# Patient Record
Sex: Female | Born: 1980
Health system: Southern US, Community
[De-identification: ages and names within clinical notes are randomized; demographics above are authoritative.]

## PROBLEM LIST (undated history)

## (undated) ENCOUNTER — Inpatient Hospital Stay (HOSPITAL_COMMUNITY): Payer: Self-pay

## (undated) DIAGNOSIS — E669 Obesity, unspecified: Secondary | ICD-10-CM

## (undated) DIAGNOSIS — G4700 Insomnia, unspecified: Secondary | ICD-10-CM

## (undated) DIAGNOSIS — T7840XA Allergy, unspecified, initial encounter: Secondary | ICD-10-CM

## (undated) DIAGNOSIS — R931 Abnormal findings on diagnostic imaging of heart and coronary circulation: Secondary | ICD-10-CM

## (undated) DIAGNOSIS — N83209 Unspecified ovarian cyst, unspecified side: Secondary | ICD-10-CM

## (undated) DIAGNOSIS — C73 Malignant neoplasm of thyroid gland: Secondary | ICD-10-CM

## (undated) DIAGNOSIS — Z6834 Body mass index (BMI) 34.0-34.9, adult: Secondary | ICD-10-CM

## (undated) DIAGNOSIS — C770 Secondary and unspecified malignant neoplasm of lymph nodes of head, face and neck: Secondary | ICD-10-CM

## (undated) DIAGNOSIS — I10 Essential (primary) hypertension: Secondary | ICD-10-CM

## (undated) DIAGNOSIS — G43909 Migraine, unspecified, not intractable, without status migrainosus: Secondary | ICD-10-CM

## (undated) DIAGNOSIS — R0989 Other specified symptoms and signs involving the circulatory and respiratory systems: Secondary | ICD-10-CM

## (undated) DIAGNOSIS — I341 Nonrheumatic mitral (valve) prolapse: Secondary | ICD-10-CM

## (undated) DIAGNOSIS — K5792 Diverticulitis of intestine, part unspecified, without perforation or abscess without bleeding: Secondary | ICD-10-CM

## (undated) DIAGNOSIS — G43019 Migraine without aura, intractable, without status migrainosus: Secondary | ICD-10-CM

## (undated) DIAGNOSIS — E890 Postprocedural hypothyroidism: Secondary | ICD-10-CM

## (undated) HISTORY — DX: Malignant neoplasm of thyroid gland: C73

## (undated) HISTORY — DX: Allergy, unspecified, initial encounter: T78.40XA

## (undated) HISTORY — DX: Body mass index (BMI) 34.0-34.9, adult: Z68.34

## (undated) HISTORY — DX: Diverticulitis of intestine, part unspecified, without perforation or abscess without bleeding: K57.92

## (undated) HISTORY — DX: Other specified symptoms and signs involving the circulatory and respiratory systems: R09.89

## (undated) HISTORY — DX: Migraine without aura, intractable, without status migrainosus: G43.019

## (undated) HISTORY — DX: Postprocedural hypothyroidism: E89.0

## (undated) HISTORY — DX: Migraine, unspecified, not intractable, without status migrainosus: G43.909

## (undated) HISTORY — DX: Insomnia, unspecified: G47.00

## (undated) HISTORY — DX: Nonrheumatic mitral (valve) prolapse: I34.1

## (undated) HISTORY — PX: OTHER SURGICAL HISTORY: SHX169

## (undated) HISTORY — DX: Obesity, unspecified: E66.9

## (undated) HISTORY — PX: GALLBLADDER SURGERY: SHX652

## (undated) HISTORY — DX: Essential (primary) hypertension: I10

## (undated) HISTORY — DX: Unspecified ovarian cyst, unspecified side: N83.209

## (undated) HISTORY — DX: Abnormal findings on diagnostic imaging of heart and coronary circulation: R93.1

## (undated) HISTORY — DX: Secondary and unspecified malignant neoplasm of lymph nodes of head, face and neck: C77.0

---

## 1999-06-06 ENCOUNTER — Other Ambulatory Visit: Admission: RE | Admit: 1999-06-06 | Discharge: 1999-06-06 | Payer: Self-pay | Admitting: Obstetrics and Gynecology

## 1999-11-12 ENCOUNTER — Inpatient Hospital Stay (HOSPITAL_COMMUNITY): Admission: AD | Admit: 1999-11-12 | Discharge: 1999-11-14 | Payer: Self-pay | Admitting: *Deleted

## 2000-07-27 ENCOUNTER — Encounter: Payer: Self-pay | Admitting: Emergency Medicine

## 2000-07-27 ENCOUNTER — Emergency Department (HOSPITAL_COMMUNITY): Admission: EM | Admit: 2000-07-27 | Discharge: 2000-07-27 | Payer: Self-pay | Admitting: Emergency Medicine

## 2000-08-20 ENCOUNTER — Other Ambulatory Visit: Admission: RE | Admit: 2000-08-20 | Discharge: 2000-08-20 | Payer: Self-pay | Admitting: Obstetrics & Gynecology

## 2001-08-15 ENCOUNTER — Other Ambulatory Visit: Admission: RE | Admit: 2001-08-15 | Discharge: 2001-08-15 | Payer: Self-pay | Admitting: Obstetrics and Gynecology

## 2002-08-15 ENCOUNTER — Other Ambulatory Visit: Admission: RE | Admit: 2002-08-15 | Discharge: 2002-08-15 | Payer: Self-pay | Admitting: Obstetrics and Gynecology

## 2002-09-08 ENCOUNTER — Encounter (HOSPITAL_BASED_OUTPATIENT_CLINIC_OR_DEPARTMENT_OTHER): Payer: Self-pay | Admitting: General Surgery

## 2002-09-11 ENCOUNTER — Encounter (INDEPENDENT_AMBULATORY_CARE_PROVIDER_SITE_OTHER): Payer: Self-pay | Admitting: *Deleted

## 2002-09-11 ENCOUNTER — Ambulatory Visit (HOSPITAL_COMMUNITY): Admission: RE | Admit: 2002-09-11 | Discharge: 2002-09-12 | Payer: Self-pay | Admitting: General Surgery

## 2002-09-11 ENCOUNTER — Encounter (HOSPITAL_BASED_OUTPATIENT_CLINIC_OR_DEPARTMENT_OTHER): Payer: Self-pay | Admitting: General Surgery

## 2003-05-08 ENCOUNTER — Encounter: Payer: Self-pay | Admitting: Emergency Medicine

## 2003-05-08 ENCOUNTER — Emergency Department (HOSPITAL_COMMUNITY): Admission: EM | Admit: 2003-05-08 | Discharge: 2003-05-08 | Payer: Self-pay | Admitting: *Deleted

## 2005-11-20 ENCOUNTER — Other Ambulatory Visit: Admission: RE | Admit: 2005-11-20 | Discharge: 2005-11-20 | Payer: Self-pay | Admitting: Obstetrics and Gynecology

## 2006-03-13 ENCOUNTER — Ambulatory Visit (HOSPITAL_COMMUNITY): Admission: RE | Admit: 2006-03-13 | Discharge: 2006-03-13 | Payer: Self-pay | Admitting: Obstetrics and Gynecology

## 2006-06-04 ENCOUNTER — Inpatient Hospital Stay (HOSPITAL_COMMUNITY): Admission: AD | Admit: 2006-06-04 | Discharge: 2006-06-07 | Payer: Self-pay | Admitting: Obstetrics and Gynecology

## 2006-11-29 ENCOUNTER — Ambulatory Visit (HOSPITAL_COMMUNITY): Admission: RE | Admit: 2006-11-29 | Discharge: 2006-11-29 | Payer: Self-pay | Admitting: Obstetrics and Gynecology

## 2006-12-10 ENCOUNTER — Other Ambulatory Visit: Admission: RE | Admit: 2006-12-10 | Discharge: 2006-12-10 | Payer: Self-pay | Admitting: Interventional Radiology

## 2006-12-10 ENCOUNTER — Encounter (INDEPENDENT_AMBULATORY_CARE_PROVIDER_SITE_OTHER): Payer: Self-pay | Admitting: *Deleted

## 2006-12-10 ENCOUNTER — Encounter: Admission: RE | Admit: 2006-12-10 | Discharge: 2006-12-10 | Payer: Self-pay | Admitting: Obstetrics and Gynecology

## 2007-01-08 ENCOUNTER — Ambulatory Visit: Payer: Self-pay | Admitting: Internal Medicine

## 2007-01-15 ENCOUNTER — Encounter: Admission: RE | Admit: 2007-01-15 | Discharge: 2007-01-15 | Payer: Self-pay | Admitting: Internal Medicine

## 2007-01-18 ENCOUNTER — Encounter (INDEPENDENT_AMBULATORY_CARE_PROVIDER_SITE_OTHER): Payer: Self-pay | Admitting: *Deleted

## 2007-01-18 ENCOUNTER — Ambulatory Visit (HOSPITAL_COMMUNITY): Admission: RE | Admit: 2007-01-18 | Discharge: 2007-01-19 | Payer: Self-pay | Admitting: General Surgery

## 2007-02-05 ENCOUNTER — Encounter: Admission: RE | Admit: 2007-02-05 | Discharge: 2007-02-05 | Payer: Self-pay | Admitting: Endocrinology

## 2007-02-13 ENCOUNTER — Encounter: Admission: RE | Admit: 2007-02-13 | Discharge: 2007-02-13 | Payer: Self-pay | Admitting: Endocrinology

## 2007-02-20 ENCOUNTER — Encounter: Admission: RE | Admit: 2007-02-20 | Discharge: 2007-02-20 | Payer: Self-pay | Admitting: Endocrinology

## 2007-08-08 ENCOUNTER — Emergency Department (HOSPITAL_COMMUNITY): Admission: EM | Admit: 2007-08-08 | Discharge: 2007-08-08 | Payer: Self-pay | Admitting: Emergency Medicine

## 2008-02-28 ENCOUNTER — Emergency Department (HOSPITAL_COMMUNITY): Admission: EM | Admit: 2008-02-28 | Discharge: 2008-02-28 | Payer: Self-pay | Admitting: Family Medicine

## 2008-03-04 ENCOUNTER — Encounter: Admission: RE | Admit: 2008-03-04 | Discharge: 2008-03-04 | Payer: Self-pay | Admitting: Endocrinology

## 2008-04-06 ENCOUNTER — Ambulatory Visit (HOSPITAL_COMMUNITY): Admission: RE | Admit: 2008-04-06 | Discharge: 2008-04-06 | Payer: Self-pay | Admitting: Endocrinology

## 2008-05-06 ENCOUNTER — Encounter: Payer: Self-pay | Admitting: Endocrinology

## 2008-05-06 DIAGNOSIS — E041 Nontoxic single thyroid nodule: Secondary | ICD-10-CM | POA: Insufficient documentation

## 2008-05-28 ENCOUNTER — Emergency Department (HOSPITAL_COMMUNITY): Admission: EM | Admit: 2008-05-28 | Discharge: 2008-05-28 | Payer: Self-pay | Admitting: Family Medicine

## 2008-07-28 ENCOUNTER — Encounter (HOSPITAL_COMMUNITY): Admission: RE | Admit: 2008-07-28 | Discharge: 2008-09-17 | Payer: Self-pay | Admitting: Family Medicine

## 2009-04-19 ENCOUNTER — Encounter (HOSPITAL_COMMUNITY): Admission: RE | Admit: 2009-04-19 | Discharge: 2009-06-23 | Payer: Self-pay | Admitting: Endocrinology

## 2010-07-11 ENCOUNTER — Encounter (HOSPITAL_COMMUNITY): Admission: RE | Admit: 2010-07-11 | Discharge: 2010-09-22 | Payer: Self-pay | Admitting: Endocrinology

## 2011-01-15 ENCOUNTER — Encounter: Payer: Self-pay | Admitting: Endocrinology

## 2011-01-15 ENCOUNTER — Encounter: Payer: Self-pay | Admitting: Obstetrics and Gynecology

## 2011-01-16 ENCOUNTER — Encounter: Payer: Self-pay | Admitting: Endocrinology

## 2011-03-11 LAB — HCG, SERUM, QUALITATIVE: Preg, Serum: NEGATIVE

## 2011-04-05 LAB — HCG, SERUM, QUALITATIVE: Preg, Serum: NEGATIVE

## 2011-05-12 NOTE — Op Note (Signed)
NAME:  DELAYNA, SPARLIN                          ACCOUNT NO.:  [id number]   MEDICAL RECORD NO.:  [id number]                   PATIENT TYPE:  OIB   LOCATION:  NA                                   FACILITY:  [facility]   PHYSICIAN:  Luisa Hart L. Lurene Shadow, M.D.             DATE OF BIRTH:  [birth year]   DATE OF PROCEDURE:  09/11/2002  DATE OF DISCHARGE:                                 OPERATIVE REPORT   PREOPERATIVE DIAGNOSIS:  Chronic calculous cholecystitis.   POSTOPERATIVE DIAGNOSIS:  Chronic calculous cholecystitis.   PROCEDURE:  Laparoscopic cholecystectomy, intraoperative cholangiogram.   SURGEON:  Luisa Hart L. Lurene Shadow, M.D.   ASSISTANT:  Marnee Spring. Wiliam Ke, M.D.   ANESTHESIA:  General.   CLINICAL NOTE:  The patient is a 30 year old woman who presented with  symptoms of right upper quadrant pain with radiation to the scapula,  associated nausea and emesis, and bloating following fatty meals.  Abdominal  ultrasound demonstrates cholelithiasis.  The patient comes to the operating  room now after being apprised of the risks and potential benefits of  surgery.  All questions answered and consent for surgery obtained.   DESCRIPTION OF PROCEDURE:  Following the induction of satisfactory general  anesthesia with the patient positioned supinely, the abdomen was routinely  prepped and draped to be included in a sterile operative field.  Open  laparoscopy created at the umbilicus with insertion of a Hasson cannula and  insufflation of the peritoneal cavity to 14 mmHg pressure.  The camera was  inserted and visual exploration of the abdomen carried out.  The gallbladder  was noted to be chronically scarred with multiple adhesions around the  ampulla of the gallbladder, tethering the duodenum.  None of the small or  large intestine that was viewed appeared to be abnormal.  The edge of the  liver was smooth.  The liver surfaces were otherwise unremarkable, and none  of the pelvic organs were visualized.   Under direct vision, epigastric and  lateral ports were placed.  The gallbladder is grasped and retracted  cephalad and dissection carried down in the region of the ampulla, removing  all the adhesions, and carrying the dissection into the region to isolate  the cystic artery and cystic duct.  The cystic artery is traced up to its  entrance with the gallbladder wall, cystic duct to the gallbladder-cystic  duct junction, and down to the common duct.  The cystic duct was clipped  proximally and opened.  I inserted a Reddick catheter into the abdomen  through a 14-gauge Angiocath and inserted this into the cystic duct, where a  fluoroscopic cholangiogram was carried out, injecting one-half strength  Hypaque into the biliary system.  It showed free flow of contrast to the  duodenum, no filling defects within the common hepatic duct, common bile  duct, or of the upper hepatic radicles.  The catheter was removed from the  cystic duct  and the cystic duct doubly clipped and transected.  The cystic  artery was also doubly clipped and transected.  The gallbladder was  dissected free from the liver bed and maintaining hemostasis throughout the  course of the dissection.  This was done with electrocautery.  At the end of  the dissection, the liver bed was again checked for hemostasis, noted to be  dry.  The right upper quadrant was thoroughly irrigated with normal saline  and aspirated.  The gallbladder was then placed in an EndoCatch and  retrieved through the umbilical port without difficulty.  Sponge,  instrument, and sharp counts were verified and the pneumoperitoneum allowed  to deflate after the trocars had been removed under direct vision.  The  wounds were then closed in  layers as follows:  Epigastric and lateral  wounds closed with 4-0 Dexon and the umbilical wound closed in two layers  with 0 Dexon and 4-0 Dexon.  All wounds were then reinforced with Steri-  Strips, sterile dressings were  applied.  Anesthetic reversed, the patient  removed from the operating room to the recovery room in stable condition.  She tolerated the procedure well.                                               Mardene Celeste Lurene Shadow, M.D.    PLB/MEDQ  D:  09/11/2002  T:  09/12/2002  Job:  [id number]
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
## 2011-05-12 NOTE — Op Note (Signed)
Alexandria [name], Alexandria [name]                ACCOUNT NO.:  [id number]   MEDICAL RECORD NO.:  [id number]          PATIENT TYPE:  AMB   LOCATION:  DAY                          FACILITY:  [facility]   PHYSICIAN:  Alexandria [name], M.D.   DATE OF BIRTH:  11/10/1981   DATE OF PROCEDURE:  01/18/2007  DATE OF DISCHARGE:                               OPERATIVE REPORT   PREOPERATIVE DIAGNOSIS:  Papillary carcinoma of the thyroid.   POSTOPERATIVE DIAGNOSIS:  Papillary carcinoma of the thyroid.   PROCEDURE:  Total thyroidectomy and central neck lymph node dissection.   SURGEON:  Dr. Leonie [name].   ASSISTANT:  Dr. Cicero Duck.   ANESTHESIA:  General.   SPECIMENS TO LAB:  1. Thyroid gland.  2. Lymph node central neck.   ESTIMATED BLOOD LOSS:  20 mL.   COMPLICATIONS:  None apparent.  The patient returned to the PACU in  excellent condition.   Note, Alexandria [name] is a 30 year old female who is noted to have a right-  sided thyroid mass which on aspiration biopsy is positive for papillary  carcinoma.  The patient comes to the operating room after the risks and  potential benefits of surgery have been fully discussed including a risk  of recurrent laryngeal nerve injury and the risks of permanent  hypoparathyroidism. She understands these risks and gives her consent to  surgery.  She had previously requested that we take out only the left  lobe until the cancer has been proven definitively and then proceed with  total thyroidectomy as necessary.   DESCRIPTION OF PROCEDURE:  The patient is positioned supinely, the head  and neck slightly hyperextended. The neck is prepped and draped to be  included into a sterile operative field following the induction of  satisfactory general anesthesia.  A transverse neck incision was carried  down through skin and subcutaneous tissues and across the platysma  muscle, a superior flap raised up to the thyroid cartilage and an  inferior flap carried down to the  sternal notch.  The strap muscles are  divided and dissection carried down on the right side of the thyroid.  Immediately upon entering this area, there were noted to be multiple  lymph nodes in the central compartment. One of these was then taken off  and sent for frozen section.  Frozen section diagnosis confirmed  metastatic papillary carcinoma.  At that point, we proceeded to go ahead  with a total thyroidectomy.  Dissection was carried up to the superior  pole of the thyroid on the right. We were careful and observed a  superior laryngeal nerve which was spared.  The superior pole vessels  were secured with 2-0 silk and with clips.  Both the superior and  inferior parathyroid glands as well as the recurrent laryngeal nerve  were identified and the thyroid was dissected free from the surrounding  tissues, carrying the dissection across the midline and taking the area  of the thyroidima  using the harmonic scalpel.  Attention was then  turned to the left lobe of the thyroid which similarly both the  pyramidal lobe and  the superior pole were taken between ties of 2-0 silk  and with clips placed superiorly. Both the superior and inferior  parathyroid glands as well as the recurrent laryngeal nerve was  positively identified on the on the left side and spared during the  course of the dissection. The parathyroid was dissected free from the  trachea, removed and forwarded for pathologic evaluation with a stitch  placed at the superior pole of the right lobe.   A central compartment neck dissection was then carried out removing a  large number of matted lymph nodes from the lower trachea down into the  superior portion of the mediastinum.  On the right side, the nodal  involvement was closely adhered to the recurrent laryngeal nerve;  however, we think that this was removed successfully without any  permanent injury to the recurrent nerve.  The specimen was removed and  forwarded for  pathologic evaluation.  There were no additional palpable  nodes within this superior portion of the mediastinum that could be  palpated or the lateral portion of the central neck compartment.  Sponge  and instrument counts were then verified. hemostasis was assured with  electrocautery and the midline strap muscle then closed with a running 3-  0 Vicryl suture.  Platysma muscle closed with interrupted 3-0 Vicryl  sutures and skin closed with 5-0 Monocryl suture. The wound was then  reinforced with Steri-Strips, sterile dressings applied, the anesthetic  reversed and the patient removed from the operating room to the recovery  room in stable condition.  She tolerated the procedure well.      Alexandria [name], M.D.  Electronically Signed     PB/MEDQ  D:  01/18/2007  T:  01/18/2007  Job:  [id number]   cc:   Dorisann Frames, M.D.  Fax: [phone]   Robyn N. Allyne Gee, M.D.  Fax: [phone]

## 2011-05-12 NOTE — Consult Note (Signed)
[facility]                          ENDOCRINOLOGY CONSULTATION   STOREY, STANGELAND                       MRN:          [id number]  DATE:01/08/2007                            DOB:          [birth year]    ENDOCRINOLOGY CONSULTATION REPORT:  The patient is self-referred.   REASON FOR REFERRAL:  Thyroid nodule.   HISTORY OF PRESENT ILLNESS:  The patient is a 30 year old woman, who was  recently noted to have a nodule in the right side of her thyroid.  She  underwent biopsy and the cytology returned consistent with papillary  adenocarcinoma of the thyroid.  She was referred to Dr. Lurene Shadow to  consider thyroidectomy.  However, the patient states that, thereafter,  I went to a holistic doctor, because I believe there is a better way.  She further states, I have done research on this.   PAST MEDICAL HISTORY:  Hypertension.  She is now seven months  postpartum.   MEDICATIONS:  Oral contraceptive and Maxzide.   SOCIAL HISTORY:  She is married.  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   FAMILY HISTORY:  Negative for thyroid disease.   REVIEW OF SYSTEMS:  Denies shortness of breath and nausea.   PHYSICAL EXAMINATION:  Blood pressure 119/77, heart rate 65, temperature  is 97.6, the weight is 190.  GENERAL:  No distress.  SKIN:  Normal texture and temperature, no rash.  EYES:  No proptosis, no periorbital swelling.  NECK:  There is a 3 cm right-sided thyroid nodule.  There is no  lymphadenopathy at the neck, nor at the supraclavicular area.  NEUROLOGIC:  Alert, well-oriented.  Does not appear anxious nor  depressed, and there is no tremor.   LABORATORY STUDIES:  On December 10, 2006, cytology from needle biopsy  of right-sided thyroid mass is consistent with papillary adenocarcinoma  of the thyroid.  Thyroid ultrasound shows a 3.5 x 1.8 x 2.6 cm thyroid  mass.   IMPRESSION:  Thyroid mass, most likely papillary adenocarcinoma of  the  thyroid.   PLAN:  1. Twenty-minute discussion in a thirty-minute office visit regarding      the very high risk for malignancy.  I have told her it is extremely      risky to not undergo thyroidectomy.  I further told her that the      mode of treatment she is pursuing is ineffective and the only      effective therapy is thyroidectomy (in most cases, followed by      adjuvant iodine-131 therapy).  I have told her      she risks unnecessary death and disability by refusing surgery.  2. Return here p.r.n.     Sean A. Everardo All, MD  Electronically Signed    SAE/MedQ  DD: 01/11/2007  DT: 01/11/2007  Job #: [id number]   cc:   Osborn Coho, M.D.  Leonie Man, M.D.

## 2011-07-19 ENCOUNTER — Emergency Department (HOSPITAL_COMMUNITY)
Admission: EM | Admit: 2011-07-19 | Discharge: 2011-07-20 | Disposition: A | Discharge status: Home / Self Care | Payer: BC Managed Care – PPO | Attending: Emergency Medicine | Admitting: Emergency Medicine

## 2011-07-19 DIAGNOSIS — R42 Dizziness and giddiness: Secondary | ICD-10-CM | POA: Insufficient documentation

## 2011-07-19 DIAGNOSIS — R110 Nausea: Secondary | ICD-10-CM | POA: Insufficient documentation

## 2011-07-19 DIAGNOSIS — I10 Essential (primary) hypertension: Secondary | ICD-10-CM | POA: Insufficient documentation

## 2011-07-19 DIAGNOSIS — Z85850 Personal history of malignant neoplasm of thyroid: Secondary | ICD-10-CM | POA: Insufficient documentation

## 2011-07-19 DIAGNOSIS — R1084 Generalized abdominal pain: Secondary | ICD-10-CM | POA: Insufficient documentation

## 2011-07-19 DIAGNOSIS — E039 Hypothyroidism, unspecified: Secondary | ICD-10-CM | POA: Insufficient documentation

## 2011-07-19 DIAGNOSIS — R55 Syncope and collapse: Secondary | ICD-10-CM | POA: Insufficient documentation

## 2011-07-19 LAB — DIFFERENTIAL
Basophils Absolute: 0 10*3/uL (ref 0.0–0.1)
Basophils Relative: 0 % (ref 0–1)
Eosinophils Absolute: 0.2 10*3/uL (ref 0.0–0.7)
Eosinophils Relative: 2 % (ref 0–5)
Lymphocytes Relative: 32 % (ref 12–46)
Lymphs Abs: 2.5 10*3/uL (ref 0.7–4.0)
Monocytes Absolute: 0.6 10*3/uL (ref 0.1–1.0)
Monocytes Relative: 8 % (ref 3–12)
Neutro Abs: 4.5 10*3/uL (ref 1.7–7.7)
Neutrophils Relative %: 58 % (ref 43–77)

## 2011-07-19 LAB — CBC
HCT: 40.1 % (ref 36.0–46.0)
Hemoglobin: 13.5 g/dL (ref 12.0–15.0)
MCH: 27.4 pg (ref 26.0–34.0)
MCHC: 33.7 g/dL (ref 30.0–36.0)
MCV: 81.3 fL (ref 78.0–100.0)
Platelets: 264 10*3/uL (ref 150–400)
RBC: 4.93 MIL/uL (ref 3.87–5.11)
RDW: 12.4 % (ref 11.5–15.5)
WBC: 7.8 10*3/uL (ref 4.0–10.5)

## 2011-07-19 LAB — POCT PREGNANCY, URINE: Preg Test, Ur: NEGATIVE

## 2011-07-20 LAB — URINALYSIS, ROUTINE W REFLEX MICROSCOPIC
Bilirubin Urine: NEGATIVE
Glucose, UA: NEGATIVE mg/dL
Hgb urine dipstick: NEGATIVE
Ketones, ur: NEGATIVE mg/dL
Nitrite: NEGATIVE
Protein, ur: 100 mg/dL — AB
Specific Gravity, Urine: 1.021 (ref 1.005–1.030)
Urobilinogen, UA: 0.2 mg/dL (ref 0.0–1.0)
pH: 6.5 (ref 5.0–8.0)

## 2011-07-20 LAB — COMPREHENSIVE METABOLIC PANEL
ALT: 29 U/L (ref 0–35)
AST: 20 U/L (ref 0–37)
Albumin: 3.6 g/dL (ref 3.5–5.2)
Alkaline Phosphatase: 108 U/L (ref 39–117)
BUN: 14 mg/dL (ref 6–23)
CO2: 24 mEq/L (ref 19–32)
Calcium: 9.3 mg/dL (ref 8.4–10.5)
Chloride: 103 mEq/L (ref 96–112)
Creatinine, Ser: 1.2 mg/dL — ABNORMAL HIGH (ref 0.50–1.10)
GFR calc Af Amer: >60 mL/min (ref >60)
GFR calc non Af Amer: 52 mL/min — ABNORMAL LOW (ref >60)
Glucose, Bld: 131 mg/dL — ABNORMAL HIGH (ref 70–99)
Potassium: 3.4 mEq/L — ABNORMAL LOW (ref 3.5–5.1)
Sodium: 138 mEq/L (ref 135–145)
Total Bilirubin: 0.2 mg/dL — ABNORMAL LOW (ref 0.3–1.2)
Total Protein: 7.2 g/dL (ref 6.0–8.3)

## 2011-07-20 LAB — URINE MICROSCOPIC-ADD ON

## 2011-07-25 DIAGNOSIS — S93326A Dislocation of tarsometatarsal joint of unspecified foot, initial encounter: Secondary | ICD-10-CM

## 2011-09-21 LAB — INFLUENZA A AND B ANTIGEN (CONVERTED LAB)
Inflenza A Ag: NEGATIVE
Influenza B Ag: NEGATIVE

## 2011-09-21 LAB — POCT RAPID STREP A: Streptococcus, Group A Screen (Direct): NEGATIVE

## 2011-09-22 LAB — HCG, SERUM, QUALITATIVE
Preg, Serum: NEGATIVE
Preg, Serum: NEGATIVE

## 2011-10-10 ENCOUNTER — Other Ambulatory Visit: Payer: Self-pay | Admitting: Family Medicine

## 2011-10-10 ENCOUNTER — Ambulatory Visit
Admission: RE | Admit: 2011-10-10 | Discharge: 2011-10-10 | Disposition: A | Discharge status: Home / Self Care | Payer: BC Managed Care – PPO | Source: Ambulatory Visit | Attending: Family Medicine | Admitting: Family Medicine

## 2011-10-10 DIAGNOSIS — M549 Dorsalgia, unspecified: Secondary | ICD-10-CM

## 2011-10-10 DIAGNOSIS — M542 Cervicalgia: Secondary | ICD-10-CM

## 2012-06-14 ENCOUNTER — Encounter: Payer: Self-pay | Admitting: Obstetrics and Gynecology

## 2012-06-21 ENCOUNTER — Encounter: Payer: Self-pay | Admitting: Obstetrics and Gynecology

## 2012-06-21 ENCOUNTER — Ambulatory Visit (INDEPENDENT_AMBULATORY_CARE_PROVIDER_SITE_OTHER): Payer: BC Managed Care – PPO | Admitting: Obstetrics and Gynecology

## 2012-06-21 VITALS — BP 112/70 | HR 70 | Ht 63.5 in | Wt 205.0 lb

## 2012-06-21 DIAGNOSIS — I10 Essential (primary) hypertension: Secondary | ICD-10-CM | POA: Insufficient documentation

## 2012-06-21 DIAGNOSIS — Z01419 Encounter for gynecological examination (general) (routine) without abnormal findings: Secondary | ICD-10-CM

## 2012-06-21 DIAGNOSIS — M791 Myalgia, unspecified site: Secondary | ICD-10-CM | POA: Insufficient documentation

## 2012-06-21 DIAGNOSIS — IMO0001 Reserved for inherently not codable concepts without codable children: Secondary | ICD-10-CM

## 2012-06-21 DIAGNOSIS — R5381 Other malaise: Secondary | ICD-10-CM

## 2012-06-21 DIAGNOSIS — R5383 Other fatigue: Secondary | ICD-10-CM | POA: Insufficient documentation

## 2012-06-21 DIAGNOSIS — R0989 Other specified symptoms and signs involving the circulatory and respiratory systems: Secondary | ICD-10-CM | POA: Insufficient documentation

## 2012-06-21 DIAGNOSIS — G44009 Cluster headache syndrome, unspecified, not intractable: Secondary | ICD-10-CM

## 2012-06-21 DIAGNOSIS — Z124 Encounter for screening for malignant neoplasm of cervix: Secondary | ICD-10-CM

## 2012-06-21 DIAGNOSIS — C140 Malignant neoplasm of pharynx, unspecified: Secondary | ICD-10-CM

## 2012-06-21 LAB — POCT URINE PREGNANCY: Preg Test, Ur: NEGATIVE
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
## 2012-06-21 NOTE — Progress Notes (Addendum)
Subjective:    Alexandria [name] is a 31 y.o. female, G2P2002, who presents for an annual exam. The patient sees Dr Vela Prose for migraines and  still has right sided HA in temporal area and occurs at the end of period. Has photo/phonophobia, nausea but denies any unilateral weakness or vision loss. Hasn't taken BCPs in over a year. Menstrual cycle:   LMP: Patient's last menstrual period was 06/09/2012. Flow 3 days,  with pad change every 3-4 hours with mild cramping.             Review of Systems Pertinent items are noted in HPI. Denies pelvic pain, urinary tract symptoms, vaginitis symptoms, irregular bleeding, menopausal symptoms, change in bowel habits or rectal bleeding   Objective:    BP 112/70  Pulse 70  Ht 5' 3.5" (1.613 m)  Wt 205 lb (92.987 kg)  BMI 35.74 kg/m2  LMP 06/09/2012   Wt Readings from Last 1 Encounters:  06/21/12 205 lb (92.987 kg)   Body mass index is 35.74 kg/(m^2). General Appearance: Alert, no acute distress HEENT: Grossly normal Neck / Thyroid: Supple, no thyromegaly or cervical adenopathy Lungs: Clear to auscultation bilaterally Back: No CVA tenderness Breast Exam: No masses or nodes.No dimpling, nipple retraction or discharge. Cardiovascular: Regular rate and rhythm.  Gastrointestinal: Soft, non-tender, no masses or organomegaly Pelvic Exam: EGBUS-wnl, vagina-normal rugae, cervix- without lesions or tenderness, uterus appears normal size shape and consistency, adnexae-no masses or tenderness Lymphatic Exam: Non-palpable nodes in neck, clavicular,  axillary, or inguinal regions  Skin: no rashes or abnormalities Extremities: no clubbing cyanosis or edema  Neurologic: grossly normal Psychiatric: Alert and oriented  UPT-negative Assessment:   Routine GYN Exam Migraines   Plan:  Patient to follow up with Dr. Vela Prose for migraines  To continue to use condoms for contraception  PAP sent  RTO 1 year or prn  Alexandria [name],ELMIRAPA-C

## 2012-06-24 LAB — PAP IG W/ RFLX HPV ASCU

## 2013-09-30 ENCOUNTER — Ambulatory Visit (INDEPENDENT_AMBULATORY_CARE_PROVIDER_SITE_OTHER): Payer: BC Managed Care – PPO | Admitting: Family Medicine

## 2013-09-30 ENCOUNTER — Encounter: Payer: Self-pay | Admitting: Family Medicine

## 2013-09-30 VITALS — BP 128/90 | HR 78 | Temp 97.3°F | Resp 14 | Ht 62.5 in | Wt 199.0 lb

## 2013-09-30 DIAGNOSIS — I10 Essential (primary) hypertension: Secondary | ICD-10-CM

## 2013-09-30 DIAGNOSIS — Z0000 Encounter for general adult medical examination without abnormal findings: Secondary | ICD-10-CM

## 2013-09-30 DIAGNOSIS — E890 Postprocedural hypothyroidism: Secondary | ICD-10-CM | POA: Insufficient documentation

## 2013-09-30 DIAGNOSIS — R7309 Other abnormal glucose: Secondary | ICD-10-CM

## 2013-09-30 DIAGNOSIS — R7303 Prediabetes: Secondary | ICD-10-CM

## 2013-09-30 DIAGNOSIS — G43909 Migraine, unspecified, not intractable, without status migrainosus: Secondary | ICD-10-CM | POA: Insufficient documentation

## 2013-09-30 DIAGNOSIS — E559 Vitamin D deficiency, unspecified: Secondary | ICD-10-CM

## 2013-09-30 DIAGNOSIS — E039 Hypothyroidism, unspecified: Secondary | ICD-10-CM

## 2013-09-30 MED ORDER — TRIAMTERENE-HCTZ 37.5-25 MG PO TABS: 1.0000 | ORAL_TABLET | Freq: Every day | ORAL | Status: DC

## 2013-09-30 NOTE — Assessment & Plan Note (Signed)
Check Vit D level 

## 2013-09-30 NOTE — Assessment & Plan Note (Signed)
Continue current meds She will not continue to neurology at this time

## 2013-09-30 NOTE — Progress Notes (Signed)
  Subjective:    Patient ID: Alexandria [name], female    DOB: 1981-03-20, 32 y.o.   MRN: [id number]  HPI Patient here for complete physical this is her second visit at our office. She's currently menstruating therefore we will delay the Pap smear. Medications and history reviewed  Is history of migraine disorder she was being followed by neurology. She's had migraines since childhood has never been on any maintenance medication such as Topamax or propanolol she's always been on either anti-inflammatories or currently triptans. Her migraines are now cyclical with her menstrual cycle.  She also has history of thyroid cancer status post thyroidectomy she is currently followed by endocrinology once a year for her levels. She was last seen in January and things are doing well. She was also on ergocalciferol however she has not taken for the past 6 months.  Hypertension she's been on medications for the past 10 years has had high blood pressure for greater than 15 years she's doing well her medication has no specific concerns. When she does this her blood pressure at home it runs in the 120 systolic and less than 90 diastolic.  SHe was told she was pre-diabetic and wants to have labs rechecked  Review of Systems  GEN- denies fatigue, fever, weight loss,weakness, recent illness HEENT- denies eye drainage, change in vision, nasal discharge, CVS- denies chest pain, palpitations RESP- denies SOB, cough, wheeze ABD- denies N/V, change in stools, abd pain GU- denies dysuria, hematuria, dribbling, incontinence MSK- denies joint pain, muscle aches, injury Neuro- denies headache, dizziness, syncope, seizure activity      Objective:   Physical Exam  GEN- NAD, alert and oriented x3 HEENT- PERRL, EOMI, non injected sclera, pink conjunctiva, MMM, oropharynx clear, TM clear bilat Neck- Supple, no thryomegaly CVS- RRR, no murmur RESP-CTAB ABD-NABS,soft,NT,ND EXT- No edema Pulses- Radial, DP- 2+        Assessment & Plan:   CPE-  PAP Smear to be rescheduled due to menses, she is not fasting- will return for fasting labs   Declines flu shot, TDAP UTD

## 2013-09-30 NOTE — Patient Instructions (Addendum)
Release of records - [facility]) Dr. Rosalita Chessman Release of records- Dr. Vela Prose ( Neurology [facility]) Continue current medications Come fasting for next visit with PAP Smear and labs will be done  F/U within the next 2-3 weeks

## 2013-09-30 NOTE — Assessment & Plan Note (Signed)
Check A1C with fasting labs 

## 2013-09-30 NOTE — Assessment & Plan Note (Signed)
Well controlled, fasting labs for CPE

## 2013-10-01 ENCOUNTER — Telehealth: Payer: Self-pay | Admitting: Family Medicine

## 2013-10-01 NOTE — Telephone Encounter (Signed)
Message copied by Samuella Cota on Wed Oct 01, 2013 12:54 PM ------      Message from: Milinda Antis F      Created: Wed Oct 01, 2013  8:23 AM      Regarding: RE: Pt returning for PAP and labs       Please let pt know, we will not charge her for the OV when she comes in for her PAP Smear and labs. I discussed with my office manager due to being on menses                        ----- Message -----         From: Verna Czech         Sent: 10/01/2013   7:34 AM           To: Salley Scarlet, MD      Subject: RE: Pt returning for PAP and labs                        You can just no charge it.            If you already billed for a CPE.       ----- Message -----         From: Salley Scarlet, MD         Sent: 09/30/2013  10:00 PM           To: Verna Czech      Subject: Pt returning for PAP and labs                                   Pt was here for CPE, needs to return for labs which is fine as not fasting      But was on menses and we needed to defer the PAP smear, I told her it would be a separate OV, as this is what I have done in the past.            Is there a way to  Bundle it to her CPE?             ------

## 2013-10-01 NOTE — Telephone Encounter (Signed)
Pt is aware that will not be charged for OV when she comes in.

## 2013-10-14 ENCOUNTER — Ambulatory Visit (INDEPENDENT_AMBULATORY_CARE_PROVIDER_SITE_OTHER): Payer: BC Managed Care – PPO | Admitting: Family Medicine

## 2013-10-14 ENCOUNTER — Encounter: Payer: Self-pay | Admitting: Family Medicine

## 2013-10-14 VITALS — BP 110/80 | HR 68 | Temp 97.1°F | Resp 20 | Wt 201.0 lb

## 2013-10-14 DIAGNOSIS — R7309 Other abnormal glucose: Secondary | ICD-10-CM

## 2013-10-14 DIAGNOSIS — E559 Vitamin D deficiency, unspecified: Secondary | ICD-10-CM

## 2013-10-14 DIAGNOSIS — R7303 Prediabetes: Secondary | ICD-10-CM

## 2013-10-14 DIAGNOSIS — Z124 Encounter for screening for malignant neoplasm of cervix: Secondary | ICD-10-CM

## 2013-10-14 DIAGNOSIS — I10 Essential (primary) hypertension: Secondary | ICD-10-CM

## 2013-10-14 DIAGNOSIS — Z0000 Encounter for general adult medical examination without abnormal findings: Secondary | ICD-10-CM

## 2013-10-14 LAB — CBC WITH DIFFERENTIAL/PLATELET
Basophils Absolute: 0 10*3/uL (ref 0.0–0.1)
Basophils Relative: 1 % (ref 0–1)
Eosinophils Absolute: 0.2 10*3/uL (ref 0.0–0.7)
Eosinophils Relative: 3 % (ref 0–5)
HCT: 38.7 % (ref 36.0–46.0)
Hemoglobin: 13.3 g/dL (ref 12.0–15.0)
Lymphocytes Relative: 48 % — ABNORMAL HIGH (ref 12–46)
Lymphs Abs: 2.1 10*3/uL (ref 0.7–4.0)
MCH: 27.9 pg (ref 26.0–34.0)
MCHC: 34.4 g/dL (ref 30.0–36.0)
MCV: 81.1 fL (ref 78.0–100.0)
Monocytes Absolute: 0.4 10*3/uL (ref 0.1–1.0)
Monocytes Relative: 10 % (ref 3–12)
Neutro Abs: 1.7 10*3/uL (ref 1.7–7.7)
Neutrophils Relative %: 38 % — ABNORMAL LOW (ref 43–77)
Platelets: 326 10*3/uL (ref 150–400)
RBC: 4.77 MIL/uL (ref 3.87–5.11)
RDW: 13.1 % (ref 11.5–15.5)
WBC: 4.4 10*3/uL (ref 4.0–10.5)

## 2013-10-14 LAB — HEMOGLOBIN A1C
Hgb A1c MFr Bld: 5.9 % — ABNORMAL HIGH (ref <5.7)
Mean Plasma Glucose: 123 mg/dL — ABNORMAL HIGH (ref <117)

## 2013-10-14 LAB — COMPREHENSIVE METABOLIC PANEL
ALT: 25 U/L (ref 0–35)
AST: 21 U/L (ref 0–37)
Albumin: 4.2 g/dL (ref 3.5–5.2)
Alkaline Phosphatase: 84 U/L (ref 39–117)
BUN: 11 mg/dL (ref 6–23)
CO2: 28 mEq/L (ref 19–32)
Calcium: 9.2 mg/dL (ref 8.4–10.5)
Chloride: 102 mEq/L (ref 96–112)
Creat: 1 mg/dL (ref 0.50–1.10)
Glucose, Bld: 89 mg/dL (ref 70–99)
Potassium: 4.6 mEq/L (ref 3.5–5.3)
Sodium: 137 mEq/L (ref 135–145)
Total Bilirubin: 0.5 mg/dL (ref 0.3–1.2)
Total Protein: 7.1 g/dL (ref 6.0–8.3)

## 2013-10-14 LAB — LIPID PANEL
Cholesterol: 141 mg/dL (ref 0–200)
HDL: 55 mg/dL (ref >39)
LDL Cholesterol: 73 mg/dL (ref 0–99)
Total CHOL/HDL Ratio: 2.6 Ratio
Triglycerides: 65 mg/dL (ref <150)
VLDL: 13 mg/dL (ref 0–40)

## 2013-10-14 NOTE — Progress Notes (Signed)
  Subjective:    Patient ID: Alexandria [name], female    DOB: Dec 10, 1981, 32 y.o.   MRN: [id number]  HPI  Pt here for GYN exam and PAP Smear/ Was on menses last visit. ALso for fasting labs LMP Oct 6 Last PAP June 2013 normal   Review of Systems - deferred     Objective:   Physical Exam GEN- NAD, alert and oriented x 3 Breast- normal symmetry, no nipple inversion,no nipple drainage, no nodules or lumps felt Nodes- no axillary nodes GU- normal external genitalia, vaginal mucosa pink and moist, cervix visualized no growth, no blood form os,no discharge, no CMT, no ovarian masses, uterus normal size        Assessment & Plan:    PAP SMear- completed,  Fasting labs today

## 2013-10-14 NOTE — Patient Instructions (Signed)
We will call with lab results and PAP smear  F/u 4 months for medications I recommend eye visit once a year I recommend dental visit every 6 months Goal is to  Exercise 30 minutes 5 days a week

## 2013-10-15 LAB — PAP THINPREP ASCUS RFLX HPV RFLX TYPE

## 2013-10-19 LAB — VITAMIN D 1,25 DIHYDROXY
Vitamin D 1, 25 (OH)2 Total: 76 pg/mL — ABNORMAL HIGH (ref 18–72)
Vitamin D2 1, 25 (OH)2: 17 pg/mL
Vitamin D3 1, 25 (OH)2: 59 pg/mL

## 2013-10-29 ENCOUNTER — Ambulatory Visit (INDEPENDENT_AMBULATORY_CARE_PROVIDER_SITE_OTHER): Payer: BC Managed Care – PPO | Admitting: Family Medicine

## 2013-10-29 VITALS — BP 120/90 | HR 78 | Temp 97.0°F | Resp 18 | Ht 63.0 in | Wt 198.0 lb

## 2013-10-29 DIAGNOSIS — I10 Essential (primary) hypertension: Secondary | ICD-10-CM

## 2013-10-29 DIAGNOSIS — R0789 Other chest pain: Secondary | ICD-10-CM

## 2013-10-29 DIAGNOSIS — R079 Chest pain, unspecified: Secondary | ICD-10-CM

## 2013-10-29 DIAGNOSIS — G43909 Migraine, unspecified, not intractable, without status migrainosus: Secondary | ICD-10-CM

## 2013-10-29 DIAGNOSIS — R51 Headache: Secondary | ICD-10-CM

## 2013-10-29 LAB — CBC WITH DIFFERENTIAL/PLATELET
Basophils Absolute: 0 10*3/uL (ref 0.0–0.1)
Basophils Relative: 0 % (ref 0–1)
Eosinophils Absolute: 0.1 10*3/uL (ref 0.0–0.7)
Eosinophils Relative: 2 % (ref 0–5)
HCT: 40.1 % (ref 36.0–46.0)
Hemoglobin: 13.4 g/dL (ref 12.0–15.0)
Lymphocytes Relative: 41 % (ref 12–46)
Lymphs Abs: 2.4 10*3/uL (ref 0.7–4.0)
MCH: 27.5 pg (ref 26.0–34.0)
MCHC: 33.4 g/dL (ref 30.0–36.0)
MCV: 82.3 fL (ref 78.0–100.0)
Monocytes Absolute: 0.5 10*3/uL (ref 0.1–1.0)
Monocytes Relative: 9 % (ref 3–12)
Neutro Abs: 2.9 10*3/uL (ref 1.7–7.7)
Neutrophils Relative %: 48 % (ref 43–77)
Platelets: 344 10*3/uL (ref 150–400)
RBC: 4.87 MIL/uL (ref 3.87–5.11)
RDW: 13 % (ref 11.5–15.5)
WBC: 5.9 10*3/uL (ref 4.0–10.5)

## 2013-10-29 LAB — SEDIMENTATION RATE: Sed Rate: 1 mm/hr (ref 0–22)

## 2013-10-29 MED ORDER — BUTALBITAL-APAP-CAFFEINE 50-325-40 MG PO TABS: 1.0000 | ORAL_TABLET | Freq: Four times a day (QID) | ORAL | Status: DC | PRN

## 2013-10-29 NOTE — Patient Instructions (Addendum)
Keep checking your blood pressure Low sodium diet Try the fioricet for pain for the headache

## 2013-10-29 NOTE — Progress Notes (Signed)
  Subjective:    Patient ID: Alexandria [name], female    DOB: 12-Jun-1981, 32 y.o.   MRN: [id number]  HPI  Pt here with headache. Friday had severe shooting and throbbing pains on side of head. Last a few seconds then stopped but would return. Felt fatigued with the HA. Had ran out of her BP medication and did not restart until Monday when she picked this up. She has recurrent pain Sat, Mon and Tues. She took 2 doses of maxalt which helped relieve pain some but it would return. No change in vision. She has had similar episodes of pain for the past month, these feel different than her typical migraines. Worried she has an anuersym Sunday was at [employer] "soul food" heavily salted began to have chest pain sharp in nature, mostly on right side of chest, Headache also returned. Did not have SOB, diaphoresis, N/V associated.    Review of Systems  GEN- denies fatigue, fever, weight loss,weakness, recent illness HEENT- denies eye drainage, change in vision, nasal discharge, CVS- +chest pain, palpitations RESP- denies SOB, cough, wheeze ABD- denies N/V, change in stools, abd pain GU- denies dysuria, hematuria, dribbling, incontinence MSK- denies joint pain, muscle aches, injury Neuro- + headache, dizziness, syncope, seizure activity      Objective:   Physical Exam GEN- NAD, alert and oriented x3 HEENT- PERRL, EOMI, non injected sclera, pink conjunctiva, MMM, oropharynx clear, no papilledema, TM clear bilat Neck- Supple,  CVS- RRR, no murmur RESP-CTAB EXT- No edema Pulses- Radial 2+ Neuro- CNII-XII in tact, no focal deficits  EKG- NSR, LVH       Assessment & Plan:

## 2013-10-30 ENCOUNTER — Encounter: Payer: Self-pay | Admitting: Family Medicine

## 2013-10-30 DIAGNOSIS — R0789 Other chest pain: Secondary | ICD-10-CM | POA: Insufficient documentation

## 2013-10-30 DIAGNOSIS — R51 Headache: Secondary | ICD-10-CM | POA: Insufficient documentation

## 2013-10-30 DIAGNOSIS — R519 Headache, unspecified: Secondary | ICD-10-CM | POA: Insufficient documentation

## 2013-10-30 LAB — BASIC METABOLIC PANEL
BUN: 14 mg/dL (ref 6–23)
CO2: 26 mEq/L (ref 19–32)
Calcium: 9.7 mg/dL (ref 8.4–10.5)
Chloride: 100 mEq/L (ref 96–112)
Creat: 1.05 mg/dL (ref 0.50–1.10)
Glucose, Bld: 79 mg/dL (ref 70–99)
Potassium: 3.9 mEq/L (ref 3.5–5.3)
Sodium: 138 mEq/L (ref 135–145)

## 2013-10-30 LAB — C-REACTIVE PROTEIN: CRP: 1.1 mg/dL — ABNORMAL HIGH (ref <0.60)

## 2013-10-30 NOTE — Assessment & Plan Note (Signed)
Discussed importance of meds Exercise Increase water

## 2013-10-30 NOTE — Assessment & Plan Note (Signed)
Check ESR/CRP as in temporal region though young age for TA Doubt primary CNS based on exam Most likley due to HTN and migraines Given Fiorocet for pain She does not want to start any other meds such as Topamax/propranolol at this time offered injections in office for pain but pt declined today If her pain does not resolve, Would obtain imaging

## 2013-10-30 NOTE — Assessment & Plan Note (Signed)
EKG reassuring, may have been some anxiety/stress her BP also has been elevated off meds Has had Work up for LVH per pt

## 2013-11-18 ENCOUNTER — Telehealth: Payer: Self-pay | Admitting: Family Medicine

## 2013-11-18 NOTE — Telephone Encounter (Signed)
Pt is needing a letter stating that she can get a humidifier because of her [employer] requires to have letter stating that she needs one, this will help with her sinuses she believes because she will wake up in morning and her nose will be very dry and have like blood clots in her nose.  Call back number [phone]

## 2013-11-19 ENCOUNTER — Encounter: Payer: Self-pay | Admitting: Family Medicine

## 2013-11-19 NOTE — Telephone Encounter (Signed)
Letter written,

## 2013-11-19 NOTE — Telephone Encounter (Signed)
Pt aware to pick up letter.

## 2013-11-27 ENCOUNTER — Encounter: Payer: Self-pay | Admitting: Family Medicine

## 2014-02-17 ENCOUNTER — Ambulatory Visit: Payer: BC Managed Care – PPO | Admitting: Family Medicine

## 2014-02-25 ENCOUNTER — Encounter: Payer: Self-pay | Admitting: Family Medicine

## 2014-02-25 ENCOUNTER — Ambulatory Visit (INDEPENDENT_AMBULATORY_CARE_PROVIDER_SITE_OTHER): Payer: BC Managed Care – PPO | Admitting: Family Medicine

## 2014-02-25 VITALS — BP 128/86 | HR 88 | Temp 97.7°F | Resp 18 | Ht 63.5 in | Wt 199.0 lb

## 2014-02-25 DIAGNOSIS — H539 Unspecified visual disturbance: Secondary | ICD-10-CM

## 2014-02-25 DIAGNOSIS — N939 Abnormal uterine and vaginal bleeding, unspecified: Secondary | ICD-10-CM

## 2014-02-25 DIAGNOSIS — R51 Headache: Secondary | ICD-10-CM

## 2014-02-25 DIAGNOSIS — I10 Essential (primary) hypertension: Secondary | ICD-10-CM

## 2014-02-25 DIAGNOSIS — N898 Other specified noninflammatory disorders of vagina: Secondary | ICD-10-CM

## 2014-02-25 DIAGNOSIS — R7309 Other abnormal glucose: Secondary | ICD-10-CM

## 2014-02-25 DIAGNOSIS — R7303 Prediabetes: Secondary | ICD-10-CM

## 2014-02-25 DIAGNOSIS — E039 Hypothyroidism, unspecified: Secondary | ICD-10-CM

## 2014-02-25 LAB — BASIC METABOLIC PANEL
BUN: 12 mg/dL (ref 6–23)
CO2: 24 mEq/L (ref 19–32)
Calcium: 9.3 mg/dL (ref 8.4–10.5)
Chloride: 105 mEq/L (ref 96–112)
Creat: 0.91 mg/dL (ref 0.50–1.10)
Glucose, Bld: 79 mg/dL (ref 70–99)
Potassium: 4 mEq/L (ref 3.5–5.3)
Sodium: 139 mEq/L (ref 135–145)

## 2014-02-25 LAB — CBC WITH DIFFERENTIAL/PLATELET
Basophils Absolute: 0 10*3/uL (ref 0.0–0.1)
Basophils Relative: 0 % (ref 0–1)
Eosinophils Absolute: 0.1 10*3/uL (ref 0.0–0.7)
Eosinophils Relative: 1 % (ref 0–5)
HCT: 38.5 % (ref 36.0–46.0)
Hemoglobin: 13.2 g/dL (ref 12.0–15.0)
Lymphocytes Relative: 36 % (ref 12–46)
Lymphs Abs: 1.8 10*3/uL (ref 0.7–4.0)
MCH: 27.6 pg (ref 26.0–34.0)
MCHC: 34.3 g/dL (ref 30.0–36.0)
MCV: 80.4 fL (ref 78.0–100.0)
Monocytes Absolute: 0.4 10*3/uL (ref 0.1–1.0)
Monocytes Relative: 8 % (ref 3–12)
Neutro Abs: 2.8 10*3/uL (ref 1.7–7.7)
Neutrophils Relative %: 55 % (ref 43–77)
Platelets: 312 10*3/uL (ref 150–400)
RBC: 4.79 MIL/uL (ref 3.87–5.11)
RDW: 13.2 % (ref 11.5–15.5)
WBC: 5.1 10*3/uL (ref 4.0–10.5)

## 2014-02-25 LAB — HEMOGLOBIN A1C
Hgb A1c MFr Bld: 5.8 % — ABNORMAL HIGH (ref <5.7)
Mean Plasma Glucose: 120 mg/dL — ABNORMAL HIGH (ref <117)

## 2014-02-25 LAB — T3, FREE: T3, Free: 3.7 pg/mL (ref 2.3–4.2)

## 2014-02-25 LAB — TSH: TSH: 0.01 u[IU]/mL — ABNORMAL LOW (ref 0.350–4.500)

## 2014-02-25 LAB — T4, FREE: Free T4: 1.63 ng/dL (ref 0.80–1.80)

## 2014-02-25 NOTE — Assessment & Plan Note (Signed)
Blood pressure looks good continue Maxzide

## 2014-02-25 NOTE — Assessment & Plan Note (Signed)
She initially wanted a pregnancy test however it would not show anything as it is only been 3 weeks since her last menstrual. She will wait another week and see if her cycle starts and not she will test at home. She's not having any vaginal discharge or pelvic pain. This may of been a normal variant

## 2014-02-25 NOTE — Assessment & Plan Note (Addendum)
She has persistent and increasing headaches on the right side possibility this is a migraine variant for her or type of cluster headache as some days she will have multiple in one day. I am concerned that she's had been vision changes with it therefore I will obtain an MRI of the brain   If MRI is negative we did broach the subject of starting medications daily as there is a possibility that she and her husband want to have children we will avoid the Topamax but they considered gabapentin or Inderal

## 2014-02-25 NOTE — Progress Notes (Signed)
Patient ID: Alexandria [name], female   DOB: 25-May-1981, 33 y.o.   MRN: [id number]   Subjective:    Patient ID: Alexandria [name], female    DOB: 07/22/1981, 33 y.o.   MRN: [id number]  Patient presents for 4 month F/U, headaches and spotting  patient here to follow chronic medical problems. She continues to have severe episodes of pain on the right side of her head that felt different from her typical migraines. She states that they have been more episodes that her previous visit. They tend to drop right on the right side and occasionally cause change in her vision. She has been taking over-the-counter medication as needed for the headache which helps sometimes.  Hypothyroidism she is due for repeat thyroid studies she is history of papillary thyroid cancer status post thyroid removal  Vaginal spotting twice over the past 3 weeks. Her last menstrual period was on February 12th. She's not used any type of birth control. She denies any pelvic pain or vaginal discharge     Review Of Systems:  GEN- denies fatigue, fever, weight loss,weakness, recent illness HEENT- denies eye drainage, change in vision, nasal discharge, CVS- denies chest pain, palpitations RESP- denies SOB, cough, wheeze ABD- denies N/V, change in stools, abd pain GU- denies dysuria, hematuria, dribbling, incontinence Neuro-+  headache, dizziness, syncope, seizure activity       Objective:    BP 128/86  Pulse 88  Temp(Src) 97.7 F (36.5 C)  Resp 18  Ht 5' 3.5" (1.613 m)  Wt 199 lb (90.266 kg)  BMI 34.69 kg/m2  LMP 02/05/2014 GEN- NAD, alert and oriented x3 HEENT- PERRL, EOMI, non injected sclera, pink conjunctiva, MMM, oropharynx clear,fundus benign Neck- Supple,  CVS- RRR, no murmur RESP-CTAB EXT- No edema Pulses- Radial, DP- 2+ Neuro- CNII-XII in tact, no focal deficits        Assessment & Plan:      Problem List Items Addressed This Visit   Prediabetes   Relevant Orders      Hemoglobin A1c (Completed)    Hypothyroidism (acquired) - Primary   Relevant Medications      SYNTHROID 175 MCG tablet   Other Relevant Orders      TSH (Completed)      T3, Free (Completed)      T4, Free (Completed)   Hypertension   Relevant Orders      Basic metabolic panel (Completed)      CBC with Differential (Completed)      Note: This dictation was prepared with Dragon dictation along with smaller phrase technology. Any transcriptional errors that result from this process are unintentional.

## 2014-02-25 NOTE — Patient Instructions (Signed)
Continue current medications MRI to be set up for the migraines We will send a letter with lab results  F/U 4 months

## 2014-02-27 ENCOUNTER — Encounter: Payer: Self-pay | Admitting: *Deleted

## 2014-03-02 ENCOUNTER — Ambulatory Visit
Admission: RE | Admit: 2014-03-02 | Discharge: 2014-03-02 | Disposition: A | Discharge status: Home / Self Care | Payer: BC Managed Care – PPO | Source: Ambulatory Visit | Attending: Family Medicine | Admitting: Family Medicine

## 2014-03-02 DIAGNOSIS — H539 Unspecified visual disturbance: Secondary | ICD-10-CM

## 2014-03-02 DIAGNOSIS — R51 Headache: Secondary | ICD-10-CM

## 2014-03-03 ENCOUNTER — Other Ambulatory Visit: Payer: Self-pay | Admitting: *Deleted

## 2014-03-03 MED ORDER — BUTALBITAL-APAP-CAFFEINE 50-325-40 MG PO TABS: 1.0000 | ORAL_TABLET | Freq: Four times a day (QID) | ORAL | Status: DC | PRN

## 2014-03-03 NOTE — Telephone Encounter (Signed)
Refill appropriate and filled per protocol. 

## 2014-03-05 ENCOUNTER — Telehealth: Payer: Self-pay | Admitting: *Deleted

## 2014-03-05 MED ORDER — AMOXICILLIN-POT CLAVULANATE 875-125 MG PO TABS: 1.0000 | ORAL_TABLET | Freq: Two times a day (BID) | ORAL | Status: DC

## 2014-03-05 NOTE — Telephone Encounter (Signed)
Message copied by Sheral Flow on Thu Mar 05, 2014  2:27 PM ------      Message from: Lenore Manner      Created: Thu Mar 05, 2014  2:03 PM      Regarding: RTN Call      Contact: [phone]       Pt is wanting to know if she can have an antibiotic called in based on the MRI findings or does she need to come in to be seen. Please call and let her know ------

## 2014-03-05 NOTE — Telephone Encounter (Signed)
Received call from patient.   Reported that she is having a lot of sinus pressure and headaches. States that she has thick green mucus draining.   Reports that she is taking Sudafed and it alleviates pressure for a while, but by the end of the day, she has severe headache.   MRI obtained on 03/02/2014 and noted to show chronic sinusitis.   MD ordered Fiorcet PRN for migraines.   Requested MD to advise about continued drainage.

## 2014-03-05 NOTE — Telephone Encounter (Signed)
Prescription sent to pharmacy. .   Call placed to patient and patient made aware.  

## 2014-03-05 NOTE — Telephone Encounter (Signed)
Returned call to patient.  Advised that since MRI shows chronic inflammation of the sinuses, and MD had already reviewed results with no new orders, ABT order would not be sent to pharmacy.   Left message on VM.   Advised to call back if having worsening S/Sx.

## 2014-03-05 NOTE — Telephone Encounter (Signed)
Try augmentin 875 bid for 10 days.

## 2014-06-01 ENCOUNTER — Other Ambulatory Visit: Payer: Self-pay | Admitting: Family Medicine

## 2014-06-03 NOTE — Telephone Encounter (Signed)
Refill appropriate and filled per protocol. 

## 2014-06-04 ENCOUNTER — Ambulatory Visit (INDEPENDENT_AMBULATORY_CARE_PROVIDER_SITE_OTHER): Payer: BC Managed Care – PPO | Admitting: Physician Assistant

## 2014-06-04 ENCOUNTER — Encounter: Payer: Self-pay | Admitting: Physician Assistant

## 2014-06-04 VITALS — BP 122/86 | HR 80 | Temp 98.1°F | Resp 18 | Wt 200.0 lb

## 2014-06-04 DIAGNOSIS — R51 Headache: Secondary | ICD-10-CM

## 2014-06-04 DIAGNOSIS — I10 Essential (primary) hypertension: Secondary | ICD-10-CM

## 2014-06-04 NOTE — Progress Notes (Signed)
Patient ID: Alexandria [name] MRN: [id number], DOB: 02-15-1981, 33 y.o. Date of Encounter: 06/04/2014, 4:00 PM    Chief Complaint:  Chief Complaint  Patient presents with  . sick    not feeling well x 1 day, bad HA, BP high at home, been skipping meds, nauseated dizzy feeling     HPI: 33 y.o. year old AA female  says that last night she just wasn't feeling real good. This morning she had a headache so she checked her blood pressure and got 145/99. Today she's also been feeling like she's had some indigestion and some gurgling in her stomach. Says several days ago she was off of her blood pressure medicine for 2 or 3 days because the pharmacy had to contact our office to get approval. However now is back on blood pressure medicines for a couple of days. Says that her headache is better now. Has had no vomiting and no diarrhea. Last menstrual period was 05/21/14.     Home Meds:   Outpatient Prescriptions Prior to Visit  Medication Sig Dispense Refill  . rizatriptan (MAXALT) 10 MG tablet Take 10 mg by mouth as needed for migraine. May repeat in 2 hours if needed      . SYNTHROID 175 MCG tablet       . triamterene-hydrochlorothiazide (MAXZIDE-25) 37.5-25 MG per tablet TAKE 1 TABLET BY MOUTH DAILY.  90 tablet  3  . butalbital-acetaminophen-caffeine (FIORICET) 50-325-40 MG per tablet Take 1 tablet by mouth every 6 (six) hours as needed for headache.  20 tablet  0  . Cetirizine HCl (ZYRTEC ALLERGY PO) Take by mouth.        Marland Kitchen amoxicillin-clavulanate (AUGMENTIN) 875-125 MG per tablet Take 1 tablet by mouth 2 (two) times daily.  20 tablet  0  . Levothyroxine Sodium (SYNTHROID PO) Take 175 mcg by mouth.        No facility-administered medications prior to visit.    Allergies:  Allergies  Allergen Reactions  . Penicillins Other (See Comments)    Has not had since child, thinks she had reaction to it then      Review of Systems: See HPI for pertinent ROS. All other ROS negative.     Physical Exam: Blood pressure 122/86, pulse 80, temperature 98.1 F (36.7 C), temperature source Oral, resp. rate 18, weight 200 lb (90.719 kg)., Body mass index is 34.87 kg/(m^2). General: Obese AAF.  Appears in no acute distress. Neck: Supple. No thyromegaly. No lymphadenopathy. Lungs: Clear bilaterally to auscultation without wheezes, rales, or rhonchi. Breathing is unlabored. Heart: Regular rhythm. No murmurs, rubs, or gallops. Abdomen: Soft, non-tender, non-distended with normoactive bowel sounds. No hepatomegaly. No rebound/guarding. No obvious abdominal masses.NO area of tenderness with palpation.  Msk:  Strength and tone normal for age. Extremities/Skin: Warm and dry. Neuro: Alert and oriented X 3. Moves all extremities spontaneously. Gait is normal. CNII-XII grossly in tact. Psych:  Responds to questions appropriately with a normal affect.     ASSESSMENT AND PLAN:  33 y.o. year old female with  1. Hypertension Blood Pressure reading is now normal. Continue current medications.  2. Headache(784.0) She says that her headache is much improved now compared to this morning.  Her headache is much improved. Her blood pressure is now normal. No further evaluation or treatment is indicated at this time. Told her to followup if she develops any increase in symptoms. She already has a routine appointment scheduled with Dr. Buelah Manis . She will keep that appointment and  followup sooner if needed.    Marin Olp Zoar, [place], BSFM 06/04/2014 4:00 PM

## 2014-06-08 ENCOUNTER — Encounter: Payer: Self-pay | Admitting: Family Medicine

## 2014-06-08 ENCOUNTER — Telehealth: Payer: Self-pay | Admitting: Family Medicine

## 2014-06-08 NOTE — Telephone Encounter (Signed)
Fine. Print her a note.

## 2014-06-08 NOTE — Telephone Encounter (Signed)
Message copied by Olena Mater on Mon Jun 08, 2014 12:32 PM ------      Message from: Lenore Manner      Created: Mon Jun 08, 2014 10:23 AM      Regarding: Work note      Contact: [phone]       PT is needing out of work note for Thursday and Friday of last week.             Fax to  [phone] ------

## 2014-06-08 NOTE — Telephone Encounter (Signed)
Letter faxed to number at patient request.

## 2014-07-03 ENCOUNTER — Ambulatory Visit: Payer: BC Managed Care – PPO | Admitting: Family Medicine

## 2014-07-07 ENCOUNTER — Encounter: Payer: Self-pay | Admitting: Family Medicine

## 2014-07-07 ENCOUNTER — Ambulatory Visit (INDEPENDENT_AMBULATORY_CARE_PROVIDER_SITE_OTHER): Payer: BC Managed Care – PPO | Admitting: Family Medicine

## 2014-07-07 VITALS — BP 128/70 | HR 78 | Temp 98.2°F | Resp 14 | Ht 64.0 in | Wt 197.0 lb

## 2014-07-07 DIAGNOSIS — I10 Essential (primary) hypertension: Secondary | ICD-10-CM

## 2014-07-07 DIAGNOSIS — E039 Hypothyroidism, unspecified: Secondary | ICD-10-CM

## 2014-07-07 DIAGNOSIS — G43709 Chronic migraine without aura, not intractable, without status migrainosus: Secondary | ICD-10-CM

## 2014-07-07 NOTE — Assessment & Plan Note (Signed)
Blood pressure well controlled continue current medication 

## 2014-07-07 NOTE — Assessment & Plan Note (Signed)
Migraines under good control she has not required abortive treatment

## 2014-07-07 NOTE — Progress Notes (Signed)
Patient ID: Alexandria [name], female   DOB: Mar 17, 1981, 33 y.o.   MRN: [id number]   Subjective:    Patient ID: Alexandria [name], female    DOB: 09/03/1981, 33 y.o.   MRN: [id number]  Patient presents for 4 month F/U  patient here to follow chronic medical species no specific concerns. She's tolerating her blood pressure medication. She's not had any severe migraines and has not required her migraine medication and the past few months. She's tried the cleaner and has lost some weight. Medications reviewed    Review Of Systems:  GEN- denies fatigue, fever, weight loss,weakness, recent illness HEENT- denies eye drainage, change in vision, nasal discharge, CVS- denies chest pain, palpitations RESP- denies SOB, cough, wheeze ABD- denies N/V, change in stools, abd pain Neuro- denies headache, dizziness, syncope, seizure activity       Objective:    BP 128/70  Pulse 78  Temp(Src) 98.2 F (36.8 C) (Oral)  Resp 14  Ht 5\' 4"  (1.626 m)  Wt 197 lb (89.359 kg)  BMI 33.80 kg/m2  LMP 07/05/2014 GEN- NAD, alert and oriented x3 HEENT- PERRL, EOMI, non injected sclera, pink conjunctiva, MMM, oropharynx clear CVS- RRR, no murmur RESP-CTAB EXT- No edema Pulses- Radial, DP- 2+        Assessment & Plan:      Problem List Items Addressed This Visit   None      Note: This dictation was prepared with Dragon dictation along with smaller phrase technology. Any transcriptional errors that result from this process are unintentional.

## 2014-07-07 NOTE — Patient Instructions (Signed)
Continue current medications F/U November for PHYSICAL

## 2014-07-07 NOTE — Assessment & Plan Note (Signed)
Her thyroid function tests were done 4 months back there at the crack levels secondary to the fact she had papillary carcinoma

## 2014-08-27 ENCOUNTER — Telehealth: Payer: Self-pay | Admitting: Family Medicine

## 2014-08-27 MED ORDER — BUTALBITAL-APAP-CAFFEINE 50-325-40 MG PO TABS: 1.0000 | ORAL_TABLET | Freq: Four times a day (QID) | ORAL | Status: DC | PRN

## 2014-08-27 NOTE — Telephone Encounter (Signed)
PATIENT TOOK HER RX OF FIORICET TO PHARMACY AND DID NOT PICK IT UP, NOW THEY SAY THEY HAVE NOTHING ON FILE, WANTS TO KNOW IF WE CAN SEND ANOTHER RX FOR THIS TO A DIFFERENT PHARMACY  [facility]  [phone]

## 2014-08-27 NOTE — Telephone Encounter (Signed)
Medication called to pharmacy.  Call placed to patient and patient made aware.  

## 2014-10-09 ENCOUNTER — Other Ambulatory Visit: Payer: Self-pay

## 2014-10-26 ENCOUNTER — Encounter: Payer: Self-pay | Admitting: Family Medicine

## 2014-11-10 ENCOUNTER — Encounter: Payer: BC Managed Care – PPO | Admitting: Family Medicine

## 2014-12-01 ENCOUNTER — Other Ambulatory Visit: Payer: Self-pay | Admitting: Family Medicine

## 2014-12-01 ENCOUNTER — Other Ambulatory Visit: Payer: BC Managed Care – PPO

## 2014-12-01 DIAGNOSIS — R7303 Prediabetes: Secondary | ICD-10-CM

## 2014-12-01 DIAGNOSIS — E038 Other specified hypothyroidism: Secondary | ICD-10-CM

## 2014-12-01 DIAGNOSIS — Z79899 Other long term (current) drug therapy: Secondary | ICD-10-CM

## 2014-12-01 DIAGNOSIS — E559 Vitamin D deficiency, unspecified: Secondary | ICD-10-CM

## 2014-12-01 DIAGNOSIS — Z0000 Encounter for general adult medical examination without abnormal findings: Secondary | ICD-10-CM

## 2014-12-01 DIAGNOSIS — I10 Essential (primary) hypertension: Secondary | ICD-10-CM

## 2014-12-01 LAB — CBC WITH DIFFERENTIAL/PLATELET
Basophils Absolute: 0.1 10*3/uL (ref 0.0–0.1)
Basophils Relative: 1 % (ref 0–1)
Eosinophils Absolute: 0.1 10*3/uL (ref 0.0–0.7)
Eosinophils Relative: 1 % (ref 0–5)
HCT: 39.6 % (ref 36.0–46.0)
Hemoglobin: 13.5 g/dL (ref 12.0–15.0)
Lymphocytes Relative: 37 % (ref 12–46)
Lymphs Abs: 2.1 10*3/uL (ref 0.7–4.0)
MCH: 27.6 pg (ref 26.0–34.0)
MCHC: 34.1 g/dL (ref 30.0–36.0)
MCV: 80.8 fL (ref 78.0–100.0)
MPV: 10 fL (ref 9.4–12.4)
Monocytes Absolute: 0.4 10*3/uL (ref 0.1–1.0)
Monocytes Relative: 8 % (ref 3–12)
Neutro Abs: 3 10*3/uL (ref 1.7–7.7)
Neutrophils Relative %: 53 % (ref 43–77)
Platelets: 345 10*3/uL (ref 150–400)
RBC: 4.9 MIL/uL (ref 3.87–5.11)
RDW: 13.3 % (ref 11.5–15.5)
WBC: 5.6 10*3/uL (ref 4.0–10.5)

## 2014-12-01 LAB — HEMOGLOBIN A1C
Hgb A1c MFr Bld: 5.9 % — ABNORMAL HIGH (ref <5.7)
Mean Plasma Glucose: 123 mg/dL — ABNORMAL HIGH (ref <117)

## 2014-12-01 LAB — COMPLETE METABOLIC PANEL WITH GFR
ALT: 16 U/L (ref 0–35)
AST: 15 U/L (ref 0–37)
Albumin: 3.9 g/dL (ref 3.5–5.2)
Alkaline Phosphatase: 75 U/L (ref 39–117)
BUN: 12 mg/dL (ref 6–23)
CO2: 28 mEq/L (ref 19–32)
Calcium: 9.7 mg/dL (ref 8.4–10.5)
Chloride: 102 mEq/L (ref 96–112)
Creat: 1.05 mg/dL (ref 0.50–1.10)
GFR, Est African American: 80 mL/min
GFR, Est Non African American: 69 mL/min
Glucose, Bld: 97 mg/dL (ref 70–99)
Potassium: 4.4 mEq/L (ref 3.5–5.3)
Sodium: 137 mEq/L (ref 135–145)
Total Bilirubin: 0.4 mg/dL (ref 0.2–1.2)
Total Protein: 7.2 g/dL (ref 6.0–8.3)

## 2014-12-01 LAB — LIPID PANEL
Cholesterol: 142 mg/dL (ref 0–200)
HDL: 54 mg/dL (ref >39)
LDL Cholesterol: 77 mg/dL (ref 0–99)
Total CHOL/HDL Ratio: 2.6 Ratio
Triglycerides: 55 mg/dL (ref <150)
VLDL: 11 mg/dL (ref 0–40)

## 2014-12-01 LAB — TSH: TSH: 0.04 u[IU]/mL — ABNORMAL LOW (ref 0.350–4.500)

## 2014-12-02 LAB — VITAMIN D 25 HYDROXY (VIT D DEFICIENCY, FRACTURES): Vit D, 25-Hydroxy: 20 ng/mL — ABNORMAL LOW (ref 30–100)

## 2014-12-04 ENCOUNTER — Ambulatory Visit (INDEPENDENT_AMBULATORY_CARE_PROVIDER_SITE_OTHER): Payer: BC Managed Care – PPO | Admitting: Family Medicine

## 2014-12-04 ENCOUNTER — Encounter: Payer: Self-pay | Admitting: Family Medicine

## 2014-12-04 ENCOUNTER — Other Ambulatory Visit: Payer: Self-pay | Admitting: *Deleted

## 2014-12-04 VITALS — BP 128/62 | HR 70 | Temp 97.8°F | Resp 14 | Ht 64.0 in | Wt 197.0 lb

## 2014-12-04 DIAGNOSIS — I10 Essential (primary) hypertension: Secondary | ICD-10-CM

## 2014-12-04 DIAGNOSIS — Z0000 Encounter for general adult medical examination without abnormal findings: Secondary | ICD-10-CM

## 2014-12-04 DIAGNOSIS — E039 Hypothyroidism, unspecified: Secondary | ICD-10-CM

## 2014-12-04 DIAGNOSIS — F4329 Adjustment disorder with other symptoms: Secondary | ICD-10-CM

## 2014-12-04 DIAGNOSIS — Z23 Encounter for immunization: Secondary | ICD-10-CM

## 2014-12-04 DIAGNOSIS — E559 Vitamin D deficiency, unspecified: Secondary | ICD-10-CM

## 2014-12-04 DIAGNOSIS — G43709 Chronic migraine without aura, not intractable, without status migrainosus: Secondary | ICD-10-CM

## 2014-12-04 LAB — T4, FREE: Free T4: 1.55 ng/dL (ref 0.80–1.80)

## 2014-12-04 LAB — T3, FREE: T3, Free: 2.8 pg/mL (ref 2.3–4.2)

## 2014-12-04 MED ORDER — TRIAMTERENE-HCTZ 37.5-25 MG PO TABS: 1.0000 | ORAL_TABLET | Freq: Every day | ORAL | Status: DC

## 2014-12-04 MED ORDER — VITAMIN D (ERGOCALCIFEROL) 1.25 MG (50000 UNIT) PO CAPS: 50000.0000 [IU] | ORAL_CAPSULE | ORAL | Status: DC

## 2014-12-04 MED ORDER — LEVOTHYROXINE SODIUM 175 MCG PO TABS
175.0000 ug | ORAL_TABLET | Freq: Every day | ORAL | Status: DC
Start: 2014-12-04 — End: 2017-02-01

## 2014-12-04 MED ORDER — CYCLOBENZAPRINE HCL 10 MG PO TABS: 10.0000 mg | ORAL_TABLET | Freq: Three times a day (TID) | ORAL | Status: DC | PRN

## 2014-12-04 NOTE — Assessment & Plan Note (Signed)
Add on Free T3, T4, recheck in 8 weeks, goal 0.10

## 2014-12-04 NOTE — Assessment & Plan Note (Signed)
Well controlled no change meds

## 2014-12-04 NOTE — Progress Notes (Signed)
Patient ID: Alexandria [name], female   DOB: [birth year], 33 y.o.   MRN: [id number]   Subjective:    Patient ID: Alexandria [name], female    DOB: 1981-12-02, 33 y.o.   MRN: [id number]  Patient presents for CPE  patient here for complete physical exam. Her Pap smears up-to-date this is not due until 2016. She is not due for mammogram until age 2. Her fasting labs were reviewed with her they were significant for low vitamin D. Her thyroid is up just slightly they prefer to keep it around 0.10 based on her history and thyroid removal she is taking her medication as prescribed. She's had migraines associated with her menstrual cycle as well as tension migraines which has been long-standing for her. She tried to fear said however this made her sick. She has been taking ibuprofen around the clock she typically only has the migraines during her menstrual cycle unless there is a lot of tension coming from her neck area. There has been a lot of stress in her life recently she has a new job and is still adjusting to that as well as stress in her home life with dealing with care of her family in her children. 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 She starting to try to prioritize what she needs to do which will help with the feeling of being overwhelmed. She does not want to start prescription medications for stress/depression at this time.    Review Of Systems:  GEN- denies fatigue, fever, weight loss,weakness, recent illness HEENT- denies eye drainage, change in vision, nasal discharge, CVS- denies chest pain, palpitations RESP- denies SOB, cough, wheeze ABD- denies N/V, change in stools, abd pain GU- denies dysuria, hematuria, dribbling, incontinence MSK- denies joint pain, muscle aches, injury Neuro- denies headache, dizziness, syncope, seizure activity       Objective:    BP 128/62 mmHg  Pulse 70  Temp(Src) 97.8 F (36.6 C) (Oral)  Resp 14  Ht 5\' 4"  (1.626 m)  Wt 197 lb (89.359  kg)  BMI 33.80 kg/m2  LMP 12/03/2014 GEN- NAD, alert and oriented x3 HEENT- PERRL, EOMI, non injected sclera, pink conjunctiva, MMM, oropharynx clear Neck- Supple, no LAD CVS- RRR, no murmur RESP-CTAB ABD-NABS,soft,NT,ND Psych- tired appearing, no SI, well groomed, normal speech EXT- No edema Pulses- Radial, DP- 2+        Assessment & Plan:      Problem List Items Addressed This Visit      Unprioritized   Vitamin D deficiency - Primary   Hypothyroidism (acquired)   Relevant Medications      levothyroxine (SYNTHROID, LEVOTHROID) tablet   Hypertension   Relevant Medications      triamterene-hydrochlorothiazide (MAXZIDE-25) 37.5-25 MG per tablet    Other Visit Diagnoses    Routine general medical examination at a health care facility        PAP UTD, Immunizations, UTD, Flu shot    Need for prophylactic vaccination and inoculation against influenza        Relevant Orders       Flu Vaccine QUAD 35+ mos PF IM (Fluarix Quad PF) (Completed)       Note: This dictation was prepared with Dragon dictation along with smaller phrase technology. Any transcriptional errors that result from this process are unintentional.

## 2014-12-04 NOTE — Patient Instructions (Signed)
Flu shot given Vitamin D replace for 12 weeks Try Melatonin for sleep  Flexeril for headaches Repeat the thyroid labs in 8 weeks F/U 4 months

## 2014-12-04 NOTE — Assessment & Plan Note (Signed)
She will continue with her therapist. Burnis Medin try melatonin at bedtime for sleep as well as relaxation techniques. She will also go back to her church group which she gained a lot of insight from it also helps her day-to-day with her spirituality

## 2014-12-04 NOTE — Assessment & Plan Note (Signed)
Treat Vitamin D 50,000IU x 12 weeks

## 2014-12-04 NOTE — Assessment & Plan Note (Signed)
Hormonal and tension related Try flexeril prn, otherwise try Maxalt again Declines daily meds as only once a month typically

## 2014-12-10 ENCOUNTER — Emergency Department (HOSPITAL_COMMUNITY)
Admission: EM | Admit: 2014-12-10 | Discharge: 2014-12-10 | Disposition: A | Discharge status: Home / Self Care | Payer: BC Managed Care – PPO | Attending: Emergency Medicine | Admitting: Emergency Medicine

## 2014-12-10 ENCOUNTER — Emergency Department (HOSPITAL_COMMUNITY): Payer: BC Managed Care – PPO

## 2014-12-10 ENCOUNTER — Encounter (HOSPITAL_COMMUNITY): Payer: Self-pay | Admitting: Emergency Medicine

## 2014-12-10 DIAGNOSIS — Z79899 Other long term (current) drug therapy: Secondary | ICD-10-CM | POA: Insufficient documentation

## 2014-12-10 DIAGNOSIS — I10 Essential (primary) hypertension: Secondary | ICD-10-CM | POA: Diagnosis not present

## 2014-12-10 DIAGNOSIS — E876 Hypokalemia: Secondary | ICD-10-CM | POA: Insufficient documentation

## 2014-12-10 DIAGNOSIS — Z8679 Personal history of other diseases of the circulatory system: Secondary | ICD-10-CM | POA: Diagnosis not present

## 2014-12-10 DIAGNOSIS — R079 Chest pain, unspecified: Secondary | ICD-10-CM | POA: Diagnosis present

## 2014-12-10 DIAGNOSIS — R002 Palpitations: Secondary | ICD-10-CM | POA: Diagnosis not present

## 2014-12-10 DIAGNOSIS — Z85850 Personal history of malignant neoplasm of thyroid: Secondary | ICD-10-CM | POA: Diagnosis not present

## 2014-12-10 LAB — CBC
HCT: 42.7 % (ref 36.0–46.0)
Hemoglobin: 13.8 g/dL (ref 12.0–15.0)
MCH: 27.5 pg (ref 26.0–34.0)
MCHC: 32.3 g/dL (ref 30.0–36.0)
MCV: 85.2 fL (ref 78.0–100.0)
Platelets: 359 10*3/uL (ref 150–400)
RBC: 5.01 MIL/uL (ref 3.87–5.11)
RDW: 12.5 % (ref 11.5–15.5)
WBC: 6.3 10*3/uL (ref 4.0–10.5)

## 2014-12-10 LAB — BASIC METABOLIC PANEL
Anion gap: 13 (ref 5–15)
BUN: 14 mg/dL (ref 6–23)
CO2: 26 mEq/L (ref 19–32)
Calcium: 9.9 mg/dL (ref 8.4–10.5)
Chloride: 98 mEq/L (ref 96–112)
Creatinine, Ser: 1.09 mg/dL (ref 0.50–1.10)
GFR calc Af Amer: 75 mL/min — ABNORMAL LOW (ref >90)
GFR calc non Af Amer: 65 mL/min — ABNORMAL LOW (ref >90)
Glucose, Bld: 86 mg/dL (ref 70–99)
Potassium: 3.6 mEq/L — ABNORMAL LOW (ref 3.7–5.3)
Sodium: 137 mEq/L (ref 137–147)

## 2014-12-10 LAB — I-STAT TROPONIN, ED: Troponin i, poc: 0 ng/mL (ref 0.00–0.08)

## 2014-12-10 MED ORDER — POTASSIUM CHLORIDE CRYS ER 20 MEQ PO TBCR
40.0000 meq | EXTENDED_RELEASE_TABLET | Freq: Once | ORAL | Status: AC
Start: 2014-12-10 — End: 2014-12-10
  Administered 2014-12-10: 40 meq via ORAL
  Filled 2014-12-10: qty 2

## 2014-12-10 NOTE — Discharge Instructions (Signed)
Return to the ED with any concerns including fainting, chest pain, difficulty breathing, leg swelling, decreased level of alertness/lethargy, or any other alarming symptoms °

## 2014-12-10 NOTE — ED Provider Notes (Signed)
CSN: [id number]     Arrival date & time 12/10/14  1526 History   First MD Initiated Contact with Patient 12/10/14 1537     Chief Complaint  Patient presents with  . Chest Pain     (Consider location/radiation/quality/duration/timing/severity/associated sxs/prior Treatment) HPI  Pt presenting with c/o palpitations associated with fatigue and dizziness.  Symptoms have been ongoing intermittently over the past 3-4 days.  No fainting.  No shortness of breath.  She feels her heart racing and skipping beats.  No vomiting.  No leg swelling.  No hx of DVT/PE, no recent travel/trauma/surgery.  She does take thyroid medication- just had TSH checked on 12/8- per prior records TSH was low at 0.04 which is where her doctor wants to keep the level due to her hx of thyroid cancer.  No fever/chills.  No cough. States she has not been drinking liquids well.  Was seen at urgent care prior to ED evaluation today.  There are no other associated systemic symptoms, there are no other alleviating or modifying factors.   Past Medical History  Diagnosis Date  . Hypertension   . Migraines     Neurology- Dr. [name] Alar  . Allergy   . Thyroid cancer    Past Surgical History  Procedure Laterality Date  . Left leg surgery    . Gallbladder surgery    . Thyroid removed    . Parathyroid gland     Family History  Problem Relation Age of Onset  . Hypertension Mother   . Diabetes Mother   . Hypertension Paternal Aunt   . Hypertension Paternal Uncle   . Hypertension Maternal Grandmother   . Cancer Maternal Grandfather   . Diabetes Maternal Grandfather   . Parkinsonism Maternal Grandfather   . Alzheimer's disease Maternal Grandfather   . Liver disease Sister    History  Substance Use Topics  . Smoking status: Never Smoker   . Smokeless tobacco: Never Used  . Alcohol Use: No   OB History    Gravida Para Term Preterm AB TAB SAB Ectopic Multiple Living   2 2 2       2      Review of Systems  ROS reviewed and  all otherwise negative except for mentioned in HPI    Allergies  Penicillins  Home Medications   Prior to Admission medications   Medication Sig Start Date End Date Taking? Authorizing Provider  cyclobenzaprine (FLEXERIL) 10 MG tablet Take 1 tablet (10 mg total) by mouth 3 (three) times daily as needed for muscle spasms. 12/04/14  Yes Alycia Rossetti, MD  levothyroxine (SYNTHROID) 175 MCG tablet Take 1 tablet (175 mcg total) by mouth daily before breakfast. 12/04/14  Yes Alycia Rossetti, MD  Multiple Vitamin (MULTIVITAMIN) LIQD Take 5 mLs by mouth daily.   Yes Historical Provider, MD  triamterene-hydrochlorothiazide (MAXZIDE-25) 37.5-25 MG per tablet Take 1 tablet by mouth daily. 12/04/14  Yes Alycia Rossetti, MD  Vitamin D, Ergocalciferol, (DRISDOL) 50000 UNITS CAPS capsule Take 1 capsule (50,000 Units total) by mouth every 7 (seven) days. 12/04/14  Yes Alycia Rossetti, MD  Cetirizine HCl (ZYRTEC ALLERGY PO) Take 10 mg by mouth daily as needed (allergies).     Historical Provider, MD   BP 129/87 mmHg  Pulse 80  Temp(Src) 97.5 F (36.4 C) (Oral)  Resp 18  SpO2 100%  LMP 12/03/2014  Vitals reviewed Physical Exam  Physical Examination: General appearance - alert, well appearing, and in no distress Mental status - alert,  oriented to person, place, and time Eyes - no conjunctival injection, no scleral icterus Mouth - mucous membranes moist, pharynx normal without lesions Chest - clear to auscultation, no wheezes, rales or rhonchi, symmetric air entry Heart - normal rate, regular rhythm, normal S1, S2, no murmurs, rubs, clicks or gallops Abdomen - soft, nontender, nondistended, no masses or organomegaly Neurological - alert, oriented x 3, cranial nerves grossly intact, strength and sensation grossly intact Extremities - peripheral pulses normal, no pedal edema, no clubbing or cyanosis Skin - normal coloration and turgor, no rashes  ED Course  Procedures (including critical care  time) Labs Review Labs Reviewed  BASIC METABOLIC PANEL - Abnormal; Notable for the following:    Potassium 3.6 (*)    GFR calc non Af Amer 65 (*)    GFR calc Af Amer 75 (*)    All other components within normal limits  CBC  I-STAT TROPOININ, ED    Imaging Review Dg Chest 2 View (if Patient Has Fever And/or Copd)  12/10/2014   CLINICAL DATA:  Upper chest pain, lightheadedness and dizziness since Monday.  EXAM: CHEST  2 VIEW  COMPARISON:  None.  FINDINGS: The heart size and mediastinal contours are within normal limits. Both lungs are clear. The visualized skeletal structures are unremarkable.  IMPRESSION: No active cardiopulmonary disease.   Electronically Signed   By: Kerby Moors M.D.   On: 12/10/2014 16:53     EKG Interpretation   Date/Time:  Thursday December 10 2014 15:34:32 EST Ventricular Rate:  90 PR Interval:  140 QRS Duration: 98 QT Interval:  379 QTC Calculation: 464 R Axis:   8 Text Interpretation:  Sinus rhythm Nonspecific T abnormalities, lateral  leads No significant change since last tracing artifact present Confirmed  by Canary Brim  MD, MARTHA [phone]) on 12/10/2014 3:41:47 PM      MDM   Final diagnoses:  Palpitations  Hypokalemia    Pt presenting with c/o palpitations, lightheadedness and generalized weakness.  Some PVCs on monitor.  EKG is in sinus rhythm.  Labs reassuring with the exception of mildly low potassium which was repleted.  Will give patient information for cardiology followup for holter monitor.  PERC 0.  Low suspicion for ACS.  Discharged with strict return precautions.  Pt agreeable with plan.    Threasa Beards, MD 12/10/14 [phone]

## 2014-12-10 NOTE — ED Notes (Signed)
Pt reports intermittent upper chest pain since Monday. No sob or nausea. Reports lightheadedness and dizziness.

## 2015-03-17 ENCOUNTER — Ambulatory Visit (INDEPENDENT_AMBULATORY_CARE_PROVIDER_SITE_OTHER): Payer: 59 | Admitting: Family Medicine

## 2015-03-17 ENCOUNTER — Encounter: Payer: Self-pay | Admitting: Family Medicine

## 2015-03-17 VITALS — BP 128/78 | HR 76 | Temp 97.9°F | Resp 18 | Ht 64.0 in | Wt 195.0 lb

## 2015-03-17 DIAGNOSIS — G43709 Chronic migraine without aura, not intractable, without status migrainosus: Secondary | ICD-10-CM

## 2015-03-17 DIAGNOSIS — N946 Dysmenorrhea, unspecified: Secondary | ICD-10-CM | POA: Insufficient documentation

## 2015-03-17 MED ORDER — KETOROLAC TROMETHAMINE 60 MG/2ML IM SOLN
60.0000 mg | Freq: Once | INTRAMUSCULAR | Status: AC
  Administered 2015-03-17: 60 mg via INTRAMUSCULAR

## 2015-03-17 MED ORDER — PROMETHAZINE HCL 25 MG/ML IJ SOLN
25.0000 mg | Freq: Once | INTRAMUSCULAR | Status: AC
  Administered 2015-03-17: 25 mg via INTRAMUSCULAR

## 2015-03-17 MED ORDER — PROMETHAZINE HCL 25 MG PO TABS: 25.0000 mg | ORAL_TABLET | Freq: Three times a day (TID) | ORAL | Status: DC | PRN

## 2015-03-17 MED ORDER — TOPIRAMATE 25 MG PO TABS: 50.0000 mg | ORAL_TABLET | Freq: Every day | ORAL | Status: DC

## 2015-03-17 NOTE — Patient Instructions (Signed)
Phenergan for nausea Toradol shot given Take the topamax at bedtime ,start with 1 tablet for 2 weeks then increase 2 tablets at bedtime  Pain medication given  F/U as previous

## 2015-03-17 NOTE — Assessment & Plan Note (Signed)
Recurrent migraine headache always associated with her menstrual cycle. We discussed medications again as she is having increasing headaches throughout the month even not associated with her period. She was willing to start the Topamax was started with 25 mg and increased to 50 mg. I've given her abortive treatment of Phenergan and Toradol here in the office she has a family member to drive her home. I've also given her some hydrocodone to use for severe pain short-term

## 2015-03-17 NOTE — Progress Notes (Signed)
Patient ID: Alexandria [name], female   DOB: 11-01-1981, 34 y.o.   MRN: [id number]   Subjective:    Patient ID: Alexandria [name], female    DOB: 02/14/1981, 34 y.o.   MRN: [id number]  Patient presents for Menstrual Migraines  patient here with a typical migraine headache occurs with her menstrual cycle. She has pain on the right side of her face with pressure behind her eye dizziness associated nausea. She thought she may have had a stomach bug on top of her typical cramps because of the nausea and she had some bloating but she has not had any change in her stools. She's been taking ibuprofen but this is not helping her migraine. She's been getting migraines with every cycle however is having almost a headache couple times a week. She is actually missed quite a bit of work and they're concerned about her being name to do her job. She is not actively trying to get pregnant and is willing to try medications for her migraines now. Of note she has had an MRI of her brain which was negative a year ago    Review Of Systems:  GEN- denies fatigue, fever, weight loss,weakness, recent illness HEENT- denies eye drainage, change in vision, nasal discharge, CVS- denies chest pain, palpitations RESP- denies SOB, cough, wheeze ABD- denies N/V, change in stools,+ abd pain GU- denies dysuria, hematuria, dribbling, incontinence MSK- denies joint pain, muscle aches, injury Neuro- + headache,+ dizziness, syncope, seizure activity       Objective:    BP 128/78 mmHg  Pulse 76  Temp(Src) 97.9 F (36.6 C) (Oral)  Resp 18  Ht 5\' 4"  (1.626 m)  Wt 195 lb (88.451 kg)  BMI 33.46 kg/m2  LMP 03/15/2015 (Exact Date) GEN- NAD, alert and oriented x3 HEENT- PERRL, EOMI, non injected sclera, pink conjunctiva, MMM, oropharynx clear, fundus benign Neck- Supple, FROM CVS- RRR, no murmur RESP-CTAB ABD-NABS,soft,mild TTP Lower quadrants, no rebound, no gaurding Neuro-CNII-XII intact, no deficits Pulses- Radial2+       Assessment & Plan:      Problem List Items Addressed This Visit      Unprioritized   Migraine headache - Primary    Recurrent migraine headache always associated with her menstrual cycle. We discussed medications again as she is having increasing headaches throughout the month even not associated with her period. She was willing to start the Topamax was started with 25 mg and increased to 50 mg. I've given her abortive treatment of Phenergan and Toradol here in the office she has a family member to drive her home. I've also given her some hydrocodone to use for severe pain short-term      Relevant Medications   topiramate (TOPAMAX) tablet   promethazine (PHENERGAN) injection 25 mg (Completed)   ketorolac (TORADOL) injection 60 mg (Completed)   Menstrual cramps      Note: This dictation was prepared with Dragon dictation along with smaller phrase technology. Any transcriptional errors that result from this process are unintentional.

## 2015-04-01 LAB — HM PAP SMEAR

## 2015-04-05 ENCOUNTER — Encounter: Payer: Self-pay | Admitting: Family Medicine

## 2015-04-05 ENCOUNTER — Ambulatory Visit (INDEPENDENT_AMBULATORY_CARE_PROVIDER_SITE_OTHER): Payer: 59 | Admitting: Family Medicine

## 2015-04-05 VITALS — BP 126/70 | HR 84 | Temp 98.4°F | Resp 16 | Ht 64.0 in | Wt 198.0 lb

## 2015-04-05 DIAGNOSIS — E039 Hypothyroidism, unspecified: Secondary | ICD-10-CM | POA: Diagnosis not present

## 2015-04-05 DIAGNOSIS — I10 Essential (primary) hypertension: Secondary | ICD-10-CM | POA: Diagnosis not present

## 2015-04-05 DIAGNOSIS — G43709 Chronic migraine without aura, not intractable, without status migrainosus: Secondary | ICD-10-CM

## 2015-04-05 NOTE — Patient Instructions (Signed)
We will call with lab results Continue current medications F/U 6 months

## 2015-04-05 NOTE — Progress Notes (Signed)
Patient ID: Alexandria [name], female   DOB: 1981-09-24, 34 y.o.   MRN: [id number]   Subjective:    Patient ID: Alexandria [name], female    DOB: 08/25/1981, 34 y.o.   MRN: [id number]  Patient presents for 4 month F/U  patient to follow-up here last visit I started her on Topamax for migraines however she was unable to tolerate the medication. She was seen by her GYN who is prescribing her hormone therapy to see if this helps with her migraines. She is due for thyroid check with history of thyroid cancer. She has no new concerns today.    Review Of Systems:  GEN- denies fatigue, fever, weight loss,weakness, recent illness HEENT- denies eye drainage, change in vision, nasal discharge, CVS- denies chest pain, palpitations RESP- denies SOB, cough, wheeze ABD- denies N/V, change in stools, abd pain GU- denies dysuria, hematuria, dribbling, incontinence MSK- denies joint pain, muscle aches, injury Neuro- denies headache, dizziness, syncope, seizure activity       Objective:    BP 126/70 mmHg  Pulse 84  Temp(Src) 98.4 F (36.9 C) (Oral)  Resp 16  Ht 5\' 4"  (1.626 m)  Wt 198 lb (89.812 kg)  BMI 33.97 kg/m2  LMP 03/15/2015 (Exact Date) GEN- NAD, alert and oriented x3 HEENT- PERRL, EOMI, non injected sclera, pink conjunctiva, MMM, oropharynx clear Neck- Supple, no LAD CVS- RRR, no murmur RESP-CTAB EXT- No edema Pulses- Radial,2+        Assessment & Plan:      Problem List Items Addressed This Visit    None      Note: This dictation was prepared with Dragon dictation along with smaller phrase technology. Any transcriptional errors that result from this process are unintentional.

## 2015-04-06 LAB — COMPREHENSIVE METABOLIC PANEL
ALT: 24 U/L (ref 0–35)
AST: 21 U/L (ref 0–37)
Albumin: 4.1 g/dL (ref 3.5–5.2)
Alkaline Phosphatase: 82 U/L (ref 39–117)
BUN: 11 mg/dL (ref 6–23)
CO2: 27 mEq/L (ref 19–32)
Calcium: 9.3 mg/dL (ref 8.4–10.5)
Chloride: 104 mEq/L (ref 96–112)
Creat: 0.92 mg/dL (ref 0.50–1.10)
Glucose, Bld: 78 mg/dL (ref 70–99)
Potassium: 4.2 mEq/L (ref 3.5–5.3)
Sodium: 139 mEq/L (ref 135–145)
Total Bilirubin: 0.4 mg/dL (ref 0.2–1.2)
Total Protein: 7 g/dL (ref 6.0–8.3)

## 2015-04-06 LAB — CBC WITH DIFFERENTIAL/PLATELET
Basophils Absolute: 0 10*3/uL (ref 0.0–0.1)
Basophils Relative: 0 % (ref 0–1)
Eosinophils Absolute: 0.3 10*3/uL (ref 0.0–0.7)
Eosinophils Relative: 4 % (ref 0–5)
HCT: 41.7 % (ref 36.0–46.0)
Hemoglobin: 13.8 g/dL (ref 12.0–15.0)
Lymphocytes Relative: 40 % (ref 12–46)
Lymphs Abs: 2.6 10*3/uL (ref 0.7–4.0)
MCH: 27.9 pg (ref 26.0–34.0)
MCHC: 33.1 g/dL (ref 30.0–36.0)
MCV: 84.4 fL (ref 78.0–100.0)
MPV: 9.9 fL (ref 8.6–12.4)
Monocytes Absolute: 0.6 10*3/uL (ref 0.1–1.0)
Monocytes Relative: 9 % (ref 3–12)
Neutro Abs: 3.1 10*3/uL (ref 1.7–7.7)
Neutrophils Relative %: 47 % (ref 43–77)
Platelets: 343 10*3/uL (ref 150–400)
RBC: 4.94 MIL/uL (ref 3.87–5.11)
RDW: 13.5 % (ref 11.5–15.5)
WBC: 6.6 10*3/uL (ref 4.0–10.5)

## 2015-04-06 LAB — T4, FREE: Free T4: 1.69 ng/dL (ref 0.80–1.80)

## 2015-04-06 LAB — T3, FREE: T3, Free: 3.5 pg/mL (ref 2.3–4.2)

## 2015-04-06 LAB — TSH: TSH: <0.008 u[IU]/mL — ABNORMAL LOW (ref 0.350–4.500)

## 2015-04-06 NOTE — Assessment & Plan Note (Signed)
Recheck TFT, goal is very low TSH based on cancer history She has appt at [facility] for lymph node scan and biopsy if any present

## 2015-04-06 NOTE — Assessment & Plan Note (Signed)
She is going to try the hormone therapy If this does not work then referral to [facility]

## 2015-04-06 NOTE — Assessment & Plan Note (Signed)
Blood pressure well controlled,continue maxide

## 2015-04-12 ENCOUNTER — Encounter: Payer: Self-pay | Admitting: *Deleted

## 2015-05-08 ENCOUNTER — Other Ambulatory Visit: Payer: Self-pay | Admitting: Family Medicine

## 2015-05-10 NOTE — Telephone Encounter (Signed)
Does she still need high dose Vitamin D?

## 2015-07-28 ENCOUNTER — Ambulatory Visit (INDEPENDENT_AMBULATORY_CARE_PROVIDER_SITE_OTHER): Payer: 59 | Admitting: Family Medicine

## 2015-07-28 ENCOUNTER — Encounter: Payer: Self-pay | Admitting: Family Medicine

## 2015-07-28 VITALS — BP 118/66 | HR 78 | Temp 98.1°F | Resp 14 | Ht 64.0 in | Wt 197.0 lb

## 2015-07-28 DIAGNOSIS — N3000 Acute cystitis without hematuria: Secondary | ICD-10-CM

## 2015-07-28 LAB — URINALYSIS, ROUTINE W REFLEX MICROSCOPIC
Bilirubin Urine: NEGATIVE
Glucose, UA: NEGATIVE
Hgb urine dipstick: NEGATIVE
Ketones, ur: NEGATIVE
Leukocytes, UA: NEGATIVE
Nitrite: NEGATIVE
Protein, ur: NEGATIVE
Specific Gravity, Urine: 1.015 (ref 1.001–1.035)
pH: 8 (ref 5.0–8.0)

## 2015-07-28 MED ORDER — CIPROFLOXACIN HCL 500 MG PO TABS: 500.0000 mg | ORAL_TABLET | Freq: Two times a day (BID) | ORAL | Status: DC

## 2015-07-28 NOTE — Progress Notes (Signed)
Patient ID: Alexandria [name], female   DOB: March 02, 1981, 34 y.o.   MRN: [id number]   Subjective:    Patient ID: Alexandria [name], female    DOB: September 23, 1981, 34 y.o.   MRN: [id number]  Patient presents for Dysuria  patient with burning with urination suprapubic pressure , foul odor for the past 3 days. Feels like when she has had a UTI in the past. She denies any vaginal discharge no abnormal vaginal bleeding no nausea vomiting no fever associated. She also let me know that her father passed away a few months ago and she's been going with a lot of stress because of that. She is seeing a therapist associated with her job and things are much improved.    Review Of Systems:  GEN- denies fatigue, fever, weight loss,weakness, recent illness HEENT- denies eye drainage, change in vision, nasal discharge, CVS- denies chest pain, palpitations RESP- denies SOB, cough, wheeze ABD- denies N/V, change in stools, abd pain GU- + dysuria, denies hematuria, dribbling, incontinence MSK- denies joint pain, muscle aches, injury Neuro- denies headache, dizziness, syncope, seizure activity       Objective:    BP 118/66 mmHg  Pulse 78  Temp(Src) 98.1 F (36.7 C) (Oral)  Resp 14  Ht 5\' 4"  (1.626 m)  Wt 197 lb (89.359 kg)  BMI 33.80 kg/m2  LMP 07/07/2015 (Approximate) GEN- NAD, alert and oriented x3 CVS- RRR, no murmur RESP-CTAB ABD-NABS,soft,+ TTP suprapubic region,ND EXT- No edema Pulses- Radial,  2+        Assessment & Plan:      Problem List Items Addressed This Visit    None    Visit Diagnoses    Acute cystitis without hematuria    -  Primary    quite symptomatic though UA looks good, give Cipro x 3 days, cranberry    Relevant Orders    Urinalysis, Routine w reflex microscopic (not at [facility]) (Completed)       Note: This dictation was prepared with Dragon dictation along with smaller phrase technology. Any transcriptional errors that result from this process are unintentional.

## 2015-07-28 NOTE — Patient Instructions (Signed)
Take antibiotics Use cranberry juice F/U as needed

## 2015-08-12 ENCOUNTER — Ambulatory Visit: Payer: 59 | Admitting: Podiatry

## 2015-08-16 ENCOUNTER — Ambulatory Visit (INDEPENDENT_AMBULATORY_CARE_PROVIDER_SITE_OTHER): Payer: 59 | Admitting: Family Medicine

## 2015-08-16 ENCOUNTER — Encounter: Payer: Self-pay | Admitting: Family Medicine

## 2015-08-16 VITALS — BP 122/68 | HR 72 | Temp 98.1°F | Resp 16 | Ht 64.0 in | Wt 194.0 lb

## 2015-08-16 DIAGNOSIS — T148 Other injury of unspecified body region: Secondary | ICD-10-CM

## 2015-08-16 DIAGNOSIS — T7840XA Allergy, unspecified, initial encounter: Secondary | ICD-10-CM

## 2015-08-16 DIAGNOSIS — W57XXXA Bitten or stung by nonvenomous insect and other nonvenomous arthropods, initial encounter: Secondary | ICD-10-CM

## 2015-08-16 MED ORDER — PERMETHRIN 5 % EX CREA: 1.0000 "application " | TOPICAL_CREAM | Freq: Once | CUTANEOUS | Status: DC

## 2015-08-16 MED ORDER — METHYLPREDNISOLONE 4 MG PO TBPK: ORAL_TABLET | ORAL | Status: DC

## 2015-08-16 NOTE — Progress Notes (Signed)
Patient ID: Alexandria [name], female   DOB: 06/08/1981, 34 y.o.   MRN: [id number]   Subjective:    Patient ID: Alexandria [name], female    DOB: 1981-10-17, 34 y.o.   MRN: [id number]  Patient presents for Reaction to Unknown Insect Bite  patient here with reaction to bug bites. She was actually at her mother's home she slept on the couch she will cup early in the morning on Saturday and noticed some red spots on the back of her left arm as well as some itching a few hours later she noted things that look like hives and then some blisters popped up. She's also noticed some other scattered bumps on her hand she feels like her knuckles are swollen she has some bumps on her legs and they are all pruritic. She has been scratching all living and taking Benadryl. She became concerned because now she has itchiness in her throat and her nose it feels like an allergic reaction. When asked about others with rash she states that her brother was at her mother's home about a week or so ago and also came back with pruritic bumps. Mother does not what the home to be checked as she is suffering from grief from her husband recently passing.    Review Of Systems:  GEN- denies fatigue, fever, weight loss,weakness, recent illness HEENT- denies eye drainage, change in vision, nasal discharge, CVS- denies chest pain, palpitations RESP- denies SOB, cough, wheeze ABD- denies N/V, change in stools, abd pain GU- denies dysuria, hematuria, dribbling, incontinence MSK- denies joint pain, muscle aches, injury Neuro- denies headache, dizziness, syncope, seizure activity       Objective:    BP 122/68 mmHg  Pulse 72  Temp(Src) 98.1 F (36.7 C) (Oral)  Resp 16  Ht 5\' 4"  (1.626 m)  Wt 194 lb (87.998 kg)  BMI 33.28 kg/m2  LMP 08/07/2015 GEN- NAD, alert and oriented x3 HEENT- PERRL, EOMI Non icteric pink conjunctiva, uvula midline, no tongue swelling, oropharynx clear Skin-  Left arm- multiple scatered erythematous  maculopapular lesions with scab at center, blistering lesions tracking down triceps, erythematous maculopapular lesion quarter size on left lower back, 1 lesion on left ring finger, 1 maculopapular lesion on both ankles, +excoriations EXT- No edema Pulses- Radial 2+        Assessment & Plan:      Problem List Items Addressed This Visit    None    Visit Diagnoses    Bedbug bite    -  Primary    Will cover for bed bugs based on apperance and brother being their recently having bites    Allergic reaction, initial encounter        Concern for tingling in mouth and throat, severe itching, she is often very sensitive to rashes/ meds. Will give Depo Medrol IM, followed by Medrol dosepak       Note: This dictation was prepared with Dragon dictation along with smaller phrase technology. Any transcriptional errors that result from this process are unintentional.

## 2015-08-16 NOTE — Patient Instructions (Signed)
Apply cream tonight Start prednisone tomorrow F/U as previous

## 2015-08-25 ENCOUNTER — Encounter: Payer: Self-pay | Admitting: Family Medicine

## 2015-08-25 MED ORDER — TRIAMCINOLONE ACETONIDE 0.1 % EX CREA: 1.0000 "application " | TOPICAL_CREAM | Freq: Two times a day (BID) | CUTANEOUS | Status: DC

## 2015-08-25 MED ORDER — PERMETHRIN 5 % EX CREA: 1.0000 "application " | TOPICAL_CREAM | Freq: Once | CUTANEOUS | Status: DC

## 2015-10-06 ENCOUNTER — Ambulatory Visit: Payer: 59 | Admitting: Family Medicine

## 2015-10-21 ENCOUNTER — Ambulatory Visit (INDEPENDENT_AMBULATORY_CARE_PROVIDER_SITE_OTHER): Payer: 59 | Admitting: Physician Assistant

## 2015-10-21 ENCOUNTER — Encounter: Payer: Self-pay | Admitting: Physician Assistant

## 2015-10-21 VITALS — BP 122/76 | HR 84 | Temp 98.3°F | Resp 18 | Wt 195.0 lb

## 2015-10-21 DIAGNOSIS — L200 Besnier's prurigo: Secondary | ICD-10-CM

## 2015-10-21 DIAGNOSIS — L239 Allergic contact dermatitis, unspecified cause: Secondary | ICD-10-CM

## 2015-10-21 MED ORDER — PREDNISONE 20 MG PO TABS: ORAL_TABLET | ORAL | Status: DC

## 2015-10-22 NOTE — Progress Notes (Signed)
Patient ID: Alexandria [name] MRN: [id number], DOB: 05-10-1981, 34 y.o. Date of Encounter: 10/22/2015, 8:40 AM    Chief Complaint:  Chief Complaint  Patient presents with  . swelling in ankles     HPI: 34 y.o. year old [place] female says that Tuesday 10/19/15 she was sitting outside--started itching on lower legs--then realized she had gotten bug bites on her lowr legs.  She took Benadryl and areas improved.  However, today wore boots and socks --at 11 a.m. She took off the boots and socks and sites of bug bites were large again--she has pictures on her cell phone--- large areas of erythema, swelling at each insect bite site.      Home Meds:   Outpatient Prescriptions Prior to Visit  Medication Sig Dispense Refill  . levothyroxine (SYNTHROID) 175 MCG tablet Take 1 tablet (175 mcg total) by mouth daily before breakfast. 90 tablet 2  . Multiple Vitamin (MULTIVITAMIN) LIQD Take 5 mLs by mouth daily.    Marland Kitchen triamterene-hydrochlorothiazide (MAXZIDE-25) 37.5-25 MG per tablet Take 1 tablet by mouth daily. 90 tablet 3  . promethazine (PHENERGAN) 25 MG tablet Take 1 tablet (25 mg total) by mouth every 8 (eight) hours as needed for nausea or vomiting. (Patient not taking: Reported on 10/21/2015) 20 tablet 1  . triamcinolone cream (KENALOG) 0.1 % Apply 1 application topically 2 (two) times daily. (Patient not taking: Reported on 10/21/2015) 30 g 0  . methylPREDNISolone (MEDROL DOSEPAK) 4 MG TBPK tablet Take as directed on package 21 tablet 0  . permethrin (ACTICIN) 5 % cream Apply 1 application topically once. Apply from neck down at bedtime and then rinse 60 g 0   No facility-administered medications prior to visit.    Allergies:  Allergies  Allergen Reactions  . Penicillins Other (See Comments)    Has not had since child, thinks she had reaction to it then      Review of Systems: See HPI for pertinent ROS. All other ROS negative.    Physical Exam: Blood pressure 122/76, pulse 84,  temperature 98.3 F (36.8 C), temperature source Oral, resp. rate 18, weight 195 lb (88.451 kg)., Body mass index is 33.46 kg/(m^2). General:  Obese AAF. Appears in no acute distress. Neck: Supple. No thyromegaly. No lymphadenopathy. Lungs: Clear bilaterally to auscultation without wheezes, rales, or rhonchi. Breathing is unlabored. Heart: Regular rhythm. No murmurs, rubs, or gallops. Msk:  Strength and tone normal for age. Extremities/Skin: Currently, she has several insect bites around ankles of both legs. Sites have minimal erythema and minimal swelling/urticaria at present time.  Neuro: Alert and oriented X 3. Moves all extremities spontaneously. Gait is normal. CNII-XII grossly in tact. Psych:  Responds to questions appropriately with a normal affect.     ASSESSMENT AND PLAN:  34 y.o. year old female with  1. Allergic dermatitis Discussed that so far this has been responding well to Benadryl and recommend cont oral benadryl and to avoid overheating of those areas.  She says Benadryl makes her drowsy, says she needs to have the sites covered to go to work, says she is going to [employer] this weekend and has plans and cannot take Benadryl during the day, as it makes her drowsy. Therefore, she is to take Pred taper as directed: - predniSONE (DELTASONE) 20 MG tablet; Take 3 daily for 2 days, then 2 daily for 2 days, then 1 daily for 2 days.  Dispense: 12 tablet; Refill: 0   Signed, [address] [place], [place], [place]  10/22/2015 8:40 AM

## 2015-10-28 ENCOUNTER — Encounter: Payer: Self-pay | Admitting: Podiatry

## 2015-10-28 ENCOUNTER — Ambulatory Visit (INDEPENDENT_AMBULATORY_CARE_PROVIDER_SITE_OTHER): Payer: 59

## 2015-10-28 ENCOUNTER — Ambulatory Visit (INDEPENDENT_AMBULATORY_CARE_PROVIDER_SITE_OTHER): Payer: 59 | Admitting: Podiatry

## 2015-10-28 VITALS — BP 128/88 | HR 77 | Resp 18

## 2015-10-28 DIAGNOSIS — M779 Enthesopathy, unspecified: Secondary | ICD-10-CM | POA: Diagnosis not present

## 2015-10-28 DIAGNOSIS — R52 Pain, unspecified: Secondary | ICD-10-CM

## 2015-10-28 DIAGNOSIS — M898X9 Other specified disorders of bone, unspecified site: Secondary | ICD-10-CM

## 2015-10-28 NOTE — Patient Instructions (Signed)
Peroneal Tendinitis With Rehab Tendonitis is inflammation of a tendon. Inflammation of the tendons on the back of the outer ankle (peroneal tendons) is known as peroneal tendonitis. The peroneal tendons are responsible for connecting the muscles that allow you to stand on your tiptoes to the bones of the ankle. For this reason, peroneal tendonitis often causes pain when trying to complete such motions. Peroneal tendonitis often involves a tear (strain) of the peroneal tendons. Strains are classified into three categories. Grade 1 strains cause pain, but the tendon is not lengthened. Grade 2 strains include a lengthened ligament, due to the ligament being stretched or partially ruptured. With grade 2 strains there is still function, although function may be decreased. Grade 3 strains involve a complete tear of the tendon or muscle, and function is usually impaired. SYMPTOMS   Pain, tenderness, swelling, warmth, or redness over the back of the outer side of the ankle, the outer part of the mid-foot, or the bottom of the arch.  Pain that gets worse with ankle motion (especially when pushing off or pushing down with the front of the foot), or when standing on the ball of the foot or pushing the foot outward.  Crackling sound (crepitation) when the tendon is moved or touched. CAUSES  Peroneal tendinitis occurs when injury to the peroneal tendons causes the body to respond with inflammation. Common causes of injury include:  An overuse injury, in which the groove behind the outer ankle (where the tendon is located) causes wear on the tendon.  A sudden stress placed on the tendon, such as from an increase in the intensity, frequency, or duration of training.  Direct hit (trauma) to the tendon.  Return to activity too soon after a previous ankle injury. RISK INCREASES WITH:  Sports that require sudden, repetitive pushing off of the foot, such as jumping or quick starts.  Kicking and running sports,  especially running down hills or long distances.  Poor strength and flexibility.  Previous injury to the foot, ankle, or leg. PREVENTION  Warm up and stretch properly before activity.  Allow for adequate recovery between workouts.  Maintain physical fitness:  Strength, flexibility, and endurance.  Cardiovascular fitness.  Complete rehabilitation after previous injury. PROGNOSIS  If treated properly, peroneal tendonitis usually heals within 6 weeks.  RELATED COMPLICATIONS  Longer healing time, if not properly treated or if not given enough time to heal.  Recurring symptoms if activity is resumed too soon, with overuse, or when using poor technique.  If untreated, tendinitis may result in tendon rupture, requiring surgery. TREATMENT  Treatment first involves the use of ice and medicine to reduce pain and inflammation. The use of strengthening and stretching exercises may help reduce pain with activity. These exercises may be performed at unsuccessful, surgery to remove the inflamed tendon lining (sheath) may be advised.  MEDICATION   If pain medicine is needed, nonsteroidal anti-inflammatory medicines (aspirin and ibuprofen), or other minor pain relievers (acetaminophen), are often advised.  Do not take pain medicine for 7 days before surgery.  Prescription pain relievers may be given, if your caregiver thinks they are needed. Use only as directed and only as much as you need. HEAT AND COLD  Cold treatment (icing) should be applied for 10 to 15 minutes every 2 to 3 hours for inflammation and pain, and immediately after activity that aggravates your symptoms. Use ice packs or an ice massage.  Heat treatment may be used before performing stretching and strengthening activities prescribed by your   caregiver, physical therapist, or athletic trainer. Use a heat pack or a warm water soak. SEEK MEDICAL CARE IF:  Symptoms get worse or do not improve in 2 to 4 weeks, despite  treatment.  New, unexplained symptoms develop. (Drugs used in treatment may produce side effects.) EXERCISES RANGE OF MOTION (ROM) AND STRETCHING EXERCISES - Peroneal Tendinitis These exercises may help you when beginning to rehabilitate your injury. Your symptoms may resolve with or without further involvement from your physician, physical therapist or athletic trainer. While completing these exercises, remember:   Restoring tissue flexibility helps normal motion to return to the joints. This allows healthier, less painful movement and activity.  An effective stretch should be held for at least 30 seconds.  A stretch should never be painful. You should only feel a gentle lengthening or release in the stretched tissue. RANGE OF MOTION - Ankle Eversion  Sit with your right / left ankle crossed over your opposite knee.  Grip your foot with your opposite hand, placing your thumb on the top of your foot and your fingers across the bottom of your foot.  Gently push your foot downward with a slight rotation, so your littlest toes rise slightly toward the ceiling.  You should feel a gentle stretch on the inside of your ankle. Hold the stretch for __________ seconds. Repeat __________ times. Complete this exercise __________ times per day.  RANGE OF MOTION - Ankle Inversion  Sit with your right / left ankle crossed over your opposite knee.  Grip your foot with your opposite hand, placing your thumb on the bottom of your foot and your fingers across the top of your foot.  Gently pull your foot so the smallest toe comes toward you and your thumb pushes the inside of the ball of your foot away from you.  You should feel a gentle stretch on the outside of your ankle. Hold the stretch for __________ seconds. Repeat __________ times. Complete this exercise __________ times per day.  RANGE OF MOTION - Ankle Plantar Flexion  Sit with your right / left leg crossed over your opposite knee.  Use  your opposite hand to pull the top of your foot and toes toward you.  You should feel a gentle stretch on the top of your foot and ankle. Hold this position for __________ seconds. Repeat __________ times. Complete __________ times per day.  STRETCH - Gastroc, Standing  Place your hands on a wall.  Extend your right / left leg behind you, keeping the front knee somewhat bent.  Slightly point your toes inward on your back foot.  Keeping your right / left heel on the floor and your knee straight, shift your weight toward the wall, not allowing your back to arch.  You should feel a gentle stretch in the calf. Hold this position for __________ seconds. Repeat __________ times. Complete this stretch __________ times per day. STRETCH - Soleus, Standing  Place your hands on a wall.  Extend your right / left leg behind you, keeping the other knee somewhat bent.  Slightly point your toes inward on your back foot.  Keep your heel on the floor, bend your back knee, and slightly shift your weight over the back leg so that you feel a gentle stretch deep in your back calf.  Hold this position for __________ seconds. Repeat __________ times. Complete this stretch __________ times per day. STRETCH - Gastrocsoleus, Standing Note: This exercise can place a lot of stress on your foot and ankle. Please   complete this exercise only if specifically instructed by your caregiver.   Place the ball of your right / left foot on a step, keeping your other foot firmly on the same step.  Hold on to the wall or a rail for balance.  Slowly lift your other foot, allowing your body weight to press your heel down over the edge of the step.  You should feel a stretch in your right / left calf.  Hold this position for __________ seconds.  Repeat this exercise with a slight bend in your knee. Repeat __________ times. Complete this stretch __________ times per day.  STRENGTHENING EXERCISES - Peroneal Tendinitis   These exercises may help you when beginning to rehabilitate your injury. They may resolve your symptoms with or without further involvement from your physician, physical therapist or athletic trainer. While completing these exercises, remember:   Muscles can gain both the endurance and the strength needed for everyday activities through controlled exercises.  Complete these exercises as instructed by your physician, physical therapist or athletic trainer. Increase the resistance and repetitions only as guided by your caregiver. STRENGTH - Dorsiflexors  Secure a rubber exercise band or tubing to a fixed object (table, pole) and loop the other end around your right / left foot.  Sit on the floor facing the fixed object. The band should be slightly tense when your foot is relaxed.  Slowly draw your foot back toward you, using your ankle and toes.  Hold this position for __________ seconds. Slowly release the tension in the band and return your foot to the starting position. Repeat __________ times. Complete this exercise __________ times per day.  STRENGTH - Towel Curls  Sit in a chair, on a non-carpeted surface.  Place your foot on a towel, keeping your heel on the floor.  Pull the towel toward your heel only by curling your toes. Keep your heel on the floor.  If instructed by your physician, physical therapist or athletic trainer, add weight to the end of the towel. Repeat __________ times. Complete this exercise __________ times per day. STRENGTH - Ankle Eversion   Secure one end of a rubber exercise band or tubing to a fixed object (table, pole). Loop the other end around your foot, just before your toes.  Place your fists between your knees. This will focus your strengthening at your ankle.  Drawing the band across your opposite foot, away from the pole, slowly, pull your Yamada toe out and up. Make sure the band is positioned to resist the entire motion.  Hold this position for  __________ seconds.  Have your muscles resist the band, as it slowly pulls your foot back to the starting position. Repeat __________ times. Complete this exercise __________ times per day.    This information is not intended to replace advice given to you by your health care provider. Make sure you discuss any questions you have with your health care provider.   Document Released: 12/11/2005 Document Revised: 04/27/2015 Document Reviewed: 03/25/2009 Elsevier Interactive Patient Education 2016 [employer]  

## 2015-10-28 NOTE — Progress Notes (Signed)
   Subjective:    Patient ID: Alexandria [name], female    DOB: 11-16-1981, 34 y.o.   MRN: [id number]  HPI   34 year old female presents the office with concerns of pain to the top of her left foot which has been ongoing the last 4-5 months. Over the last month or so she is also starting to have pain in the outside portion of her foot. She denies a specific injury or trauma. No tingling or numbness. No swelling or redness. The pain has been progressive however it remains intermittent in nature. She has Mr. Pain after standing which puts pressure to the area on the top of the left foot. No other complaints at this time.   Review of Systems  All other systems reviewed and are negative.      Objective:   Physical Exam General: AAO x3, NAD  Dermatological: Skin is warm, dry and supple bilateral. Nails x 10 are well manicured; remaining integument appears unremarkable at this time. There are no open sores, no preulcerative lesions, no rash or signs of infection present.  Vascular: Dorsalis Pedis artery and Posterior Tibial artery pedal pulses are 2/4 bilateral with immedate capillary fill time. Pedal hair growth present. No varicosities and no lower extremity edema present bilateral. There is no pain with calf compression, swelling, warmth, erythema.   Neruologic: Grossly intact via light touch bilateral. Vibratory intact via tuning fork bilateral. Protective threshold with Semmes Wienstein monofilament intact to all pedal sites bilateral. Patellar and Achilles deep tendon reflexes 2+ bilateral. No Babinski or clonus noted bilateral.   Musculoskeletal: No gross boney pedal deformities bilateral.  There is tenderness on the dorsal medial aspect of the left foot along a small area of the bony exostosis off the Lisfranc joint medially. There is no overlying skin changes there is no overlying edema, erythema, increase in warmth. There is also mild discomfort along the course of the peroneal tendons just  proximal to the fifth metatarsal base and inferior to the lateral malleolus. The peroneal tendons appear to be intact. There is no edema, erythema, increase in warmth. No other areas of tenderness to bilateral lower extremities.  Mild decrease in medial arch height.No pain, crepitus, or limitation noted with foot and ankle range of motion bilateral. Muscular strength 5/5 in all groups tested bilateral.  Gait: Unassisted, Nonantalgic.      Assessment & Plan:   34 year old female with left symptomatic dorsal exostosis, at the peroneal tendinitis of the result of compensation -Treatment options discussed including all alternatives, risks, and complications -X-rays were obtained and reviewed with the patient.  -Etiology of symptoms were discussed -At this time she wishes to continue with conservative treatment. I discussed with her shoe gear modifications, offloading padding for the exostosis. Discussed steroid injection however she wishes to hold off. In regards the peroneal tendinitis is likely result of inflammation as a result of compensation. Discussed stretching/rehabilitation exercises. Anti-inflammatories as needed. Discuss orthotics and supportive shoe gear. Follow-up with me if the symptoms continue over the next 4 weeks or sooner if any problems are to arise. Call any questions or concerns in the meantime.  Celesta Gentile, DPM

## 2016-01-04 ENCOUNTER — Other Ambulatory Visit: Payer: Self-pay | Admitting: Family Medicine

## 2016-02-23 ENCOUNTER — Encounter: Payer: Self-pay | Admitting: Family Medicine

## 2016-02-23 ENCOUNTER — Ambulatory Visit (INDEPENDENT_AMBULATORY_CARE_PROVIDER_SITE_OTHER): Payer: 59 | Admitting: Family Medicine

## 2016-02-23 VITALS — BP 128/74 | HR 70 | Temp 98.5°F | Resp 14 | Ht 63.5 in | Wt 183.0 lb

## 2016-02-23 DIAGNOSIS — A084 Viral intestinal infection, unspecified: Secondary | ICD-10-CM

## 2016-02-23 MED ORDER — ONDANSETRON 4 MG PO TBDP: 4.0000 mg | ORAL_TABLET | Freq: Three times a day (TID) | ORAL | Status: DC | PRN

## 2016-02-23 NOTE — Progress Notes (Signed)
Patient ID: Alexandria [name], female   DOB: [birth year], 35 y.o.   MRN: [id number]    Subjective:    Patient ID: Alexandria [name], female    DOB: [birth year], 35 y.o.   MRN: [id number]  Patient presents for Illness   patient here with nausea and diarrhea that started on Sunday after eating at a restaurant. She initially thought she had food poisoning but no other family members are sick and then she felt fine. The next day  Diarrhea started again, resolved by Day 3. Nausea no emesis.  she now feels weak and [place] dizzy,  she thought she had low-grade fever. She's not had any significant cough. She's been belching a lot and feels very gassy. She is now able to eat bland foods she is drinking fluids. Now with soft formed BM She denies any UTI symptoms no cough or congestion. She has missed 4 days of work   Review Of Systems:  GEN- denies fatigue, fever, weight loss,weakness, recent illness HEENT- denies eye drainage, change in vision, nasal discharge, CVS- denies chest pain, palpitations RESP- denies SOB, cough, wheeze ABD-+ N/V, +change in stools, +abd pain GU- denies dysuria, hematuria, dribbling, incontinence MSK- denies joint pain, muscle aches, injury Neuro- denies headache, dizziness, syncope, seizure activity       Objective:    BP 128/74 mmHg  Pulse 70  Temp(Src) 98.5 F (36.9 C) (Oral)  Resp 14  Ht 5' 3.5" (1.613 m)  Wt 183 lb (83.008 kg)  BMI 31.90 kg/m2  LMP 02/11/2016 (Approximate) GEN- NAD, alert and oriented x3,well appearing  HEENT- PERRL, EOMI, non injected sclera, pink conjunctiva, MMM, oropharynx clear CVS- RRR, no murmur RESP-CTAB ABD-NABS,soft,mild TTP upper quadrants, no rebound, no guarding, neg murphy sign, no CVA tenderness  Pulses- Radial, 2+        Assessment & Plan:      Problem List Items Addressed This Visit    None    Visit Diagnoses    Viral gastroenteritis    -  Primary    possible food poisoning but with delayed symptoms and ongoing issues  LIKELY viral enteritis, supportive care, zofran given. Take probiotics, FLUIDS, note given for work       Note: This dictation was prepared with Diplomatic Services operational officer dictation along with smaller Company secretary. Any transcriptional errors that result from this process are unintentional.

## 2016-02-23 NOTE — Patient Instructions (Signed)
Take zofran as prescribed Give work note for 2/24, 2/27-3/2, can return 3/3 Take probiotics

## 2016-04-10 ENCOUNTER — Other Ambulatory Visit: Payer: Self-pay | Admitting: Family Medicine

## 2016-04-10 NOTE — Telephone Encounter (Signed)
Refill appropriate and filled per protocol. 

## 2016-05-11 ENCOUNTER — Encounter (HOSPITAL_COMMUNITY): Payer: Self-pay | Admitting: *Deleted

## 2016-05-11 ENCOUNTER — Inpatient Hospital Stay (HOSPITAL_COMMUNITY)
Admission: AD | Admit: 2016-05-11 | Discharge: 2016-05-11 | Disposition: A | Discharge status: Home / Self Care | Payer: 59 | Source: Ambulatory Visit | Attending: Obstetrics and Gynecology | Admitting: Obstetrics and Gynecology

## 2016-05-11 DIAGNOSIS — G43909 Migraine, unspecified, not intractable, without status migrainosus: Secondary | ICD-10-CM | POA: Diagnosis not present

## 2016-05-11 DIAGNOSIS — R7303 Prediabetes: Secondary | ICD-10-CM | POA: Insufficient documentation

## 2016-05-11 DIAGNOSIS — Z880 Allergy status to penicillin: Secondary | ICD-10-CM | POA: Diagnosis not present

## 2016-05-11 DIAGNOSIS — Z85850 Personal history of malignant neoplasm of thyroid: Secondary | ICD-10-CM | POA: Insufficient documentation

## 2016-05-11 DIAGNOSIS — O001 Tubal pregnancy without intrauterine pregnancy: Secondary | ICD-10-CM | POA: Diagnosis present

## 2016-05-11 DIAGNOSIS — O009 Unspecified ectopic pregnancy without intrauterine pregnancy: Secondary | ICD-10-CM

## 2016-05-11 DIAGNOSIS — I10 Essential (primary) hypertension: Secondary | ICD-10-CM | POA: Diagnosis not present

## 2016-05-11 LAB — CBC WITH DIFFERENTIAL/PLATELET
Basophils Absolute: 0 10*3/uL (ref 0.0–0.1)
Basophils Relative: 0 %
Eosinophils Absolute: 0.1 10*3/uL (ref 0.0–0.7)
Eosinophils Relative: 2 %
HCT: 36.3 % (ref 36.0–46.0)
Hemoglobin: 12.2 g/dL (ref 12.0–15.0)
Lymphocytes Relative: 27 %
Lymphs Abs: 2.4 10*3/uL (ref 0.7–4.0)
MCH: 27.9 pg (ref 26.0–34.0)
MCHC: 33.6 g/dL (ref 30.0–36.0)
MCV: 82.9 fL (ref 78.0–100.0)
Monocytes Absolute: 0.4 10*3/uL (ref 0.1–1.0)
Monocytes Relative: 5 %
Neutro Abs: 5.8 10*3/uL (ref 1.7–7.7)
Neutrophils Relative %: 66 %
Platelets: 337 10*3/uL (ref 150–400)
RBC: 4.38 MIL/uL (ref 3.87–5.11)
RDW: 13 % (ref 11.5–15.5)
WBC: 8.8 10*3/uL (ref 4.0–10.5)

## 2016-05-11 LAB — AST: AST: 20 U/L (ref 15–41)

## 2016-05-11 LAB — CREATININE, SERUM
Creatinine, Ser: 1.13 mg/dL — ABNORMAL HIGH (ref 0.44–1.00)
GFR calc Af Amer: >60 mL/min (ref >60)
GFR calc non Af Amer: >60 mL/min (ref >60)

## 2016-05-11 LAB — TYPE AND SCREEN
ABO/RH(D): A POS
Antibody Screen: NEGATIVE

## 2016-05-11 LAB — HCG, QUANTITATIVE, PREGNANCY: hCG, Beta Chain, Quant, S: 12029 m[IU]/mL — ABNORMAL HIGH (ref <5)

## 2016-05-11 LAB — BUN: BUN: 17 mg/dL (ref 6–20)

## 2016-05-11 MED ORDER — METHOTREXATE INJECTION FOR WOMEN'S HOSPITAL
50.0000 mg/m2 | Freq: Once | INTRAMUSCULAR | Status: AC
  Administered 2016-05-11: 100 mg via INTRAMUSCULAR
  Filled 2016-05-11: qty 2

## 2016-05-11 NOTE — MAU Provider Note (Signed)
Alexandria [name] is a 35yo, [id number], at 4.6 wks presenting to MAU announced for possible ectopic pregnancy.   Pt is followed by [facility] and was seen earlier in the day for routine Annual Exam with complaints of abnormal bleeding.  Pertinent labs are :  +UPT, Beta HCg 11,205   and US revealed  No IUP seen left ovary and adnexum normal. Right ovary seen. Ther is a solid appearing mass with an anechoic simple appearing center measuring 1.7x1.5x 1.5cm with flow present within and surrounding this mass on color doppler. Free fluid seen in the cul de sac.   She was counciled regarding ectopic pregnancyand sent to MAU for Methotrexate protocol labs and Methotrexate injection.    History     Patient Active Problem List   Diagnosis Date Noted  . Menstrual cramps 03/17/2015  . Stress and adjustment reaction 12/04/2014  . Vaginal spotting 02/25/2014  . Atypical chest pain 10/30/2013  . Headache(784.0) 10/30/2013  . Hypothyroidism (acquired) 09/30/2013  . Migraine headache 09/30/2013  . Prediabetes 09/30/2013  . Vitamin D deficiency 09/30/2013  . Hypertension     No chief complaint on file.  HPI  OB History    Gravida Para Term Preterm AB TAB SAB Ectopic Multiple Living   3 2 2       2       Past Medical History  Diagnosis Date  . Hypertension   . Migraines     Neurology- Dr. [name] Alar  . Allergy   . Thyroid cancer [facility])     Past Surgical History  Procedure Laterality Date  . Left leg surgery    . Gallbladder surgery    . Thyroid removed    . Parathyroid gland      Family History  Problem Relation Age of Onset  . Hypertension Mother   . Diabetes Mother   . Hypertension Paternal Aunt   . Hypertension Paternal Uncle   . Hypertension Maternal Grandmother   . Cancer Maternal Grandfather   . Diabetes Maternal Grandfather   . Parkinsonism Maternal Grandfather   . Alzheimer's disease Maternal Grandfather   . Liver disease Sister     Social History  Substance Use  Topics  . Smoking status: Never Smoker   . Smokeless tobacco: Never Used  . Alcohol Use: No    Allergies:  Allergies  Allergen Reactions  . Penicillins Other (See Comments)    Has not had since child, thinks she had reaction to it then    Prescriptions prior to admission  Medication Sig Dispense Refill Last Dose  . baclofen (LIORESAL) 10 MG tablet Take 10 mg by mouth 3 (three) times daily.   Taking  . levothyroxine (SYNTHROID) 175 MCG tablet Take 1 tablet (175 mcg total) by mouth daily before breakfast. 90 tablet 2 Taking  . Multiple Vitamin (MULTIVITAMIN) LIQD Take 5 mLs by mouth daily.   Taking  . ondansetron (ZOFRAN ODT) 4 MG disintegrating tablet Take 1 tablet (4 mg total) by mouth every 8 (eight) hours as needed for nausea or vomiting. 20 tablet 0   . progesterone (PROMETRIUM) 100 MG capsule Take 100 mg by mouth daily.   Taking  . triamterene-hydrochlorothiazide (MAXZIDE-25) 37.5-25 MG tablet TAKE 1 TABLET BY MOUTH DAILY. 90 tablet 0   . zonisamide (ZONEGRAN) 100 MG capsule Take 100 mg by mouth daily.   Taking    ROS Physical Exam   Blood pressure 121/81, pulse 92, temperature 98.2 F (36.8 C), resp. rate 18, height 5\' 5"  (1.651  m), weight 84.823 kg (187 lb), last menstrual period 04/07/2016.  Results for orders placed or performed during the hospital encounter of 05/11/16 (from the past 24 hour(s))  hCG, quantitative, pregnancy     Status: Abnormal   Collection Time: 05/11/16  7:36 PM  Result Value Ref Range   hCG, Beta Chain, Quant, S 12029 (H) <5 mIU/mL  CBC WITH DIFFERENTIAL     Status: None   Collection Time: 05/11/16  7:36 PM  Result Value Ref Range   WBC 8.8 4.0 - 10.5 K/uL   RBC 4.38 3.87 - 5.11 MIL/uL   Hemoglobin 12.2 12.0 - 15.0 g/dL   HCT 36.3 36.0 - 46.0 %   MCV 82.9 78.0 - 100.0 fL   MCH 27.9 26.0 - 34.0 pg   MCHC 33.6 30.0 - 36.0 g/dL   RDW 13.0 11.5 - 15.5 %   Platelets 337 150 - 400 K/uL   Neutrophils Relative % 66 %   Neutro Abs 5.8 1.7 - 7.7  K/uL   Lymphocytes Relative 27 %   Lymphs Abs 2.4 0.7 - 4.0 K/uL   Monocytes Relative 5 %   Monocytes Absolute 0.4 0.1 - 1.0 K/uL   Eosinophils Relative 2 %   Eosinophils Absolute 0.1 0.0 - 0.7 K/uL   Basophils Relative 0 %   Basophils Absolute 0.0 0.0 - 0.1 K/uL  AST     Status: None   Collection Time: 05/11/16  7:36 PM  Result Value Ref Range   AST 20 15 - 41 U/L  BUN     Status: None   Collection Time: 05/11/16  7:36 PM  Result Value Ref Range   BUN 17 6 - 20 mg/dL  Creatinine, serum     Status: Abnormal   Collection Time: 05/11/16  7:36 PM  Result Value Ref Range   Creatinine, Ser 1.13 (H) 0.44 - 1.00 mg/dL   GFR calc non Af Amer >60 >60 mL/min   GFR calc Af Amer >60 >60 mL/min  Type and screen [place]     Status: None   Collection Time: 05/11/16  7:37 PM  Result Value Ref Range   ABO/RH(D) A POS    Antibody Screen NEG    Sample Expiration 05/14/2016     Physical Exam  Constitutional: She is oriented to person, place, and time. She appears well-developed.  HENT:  Head: Normocephalic.  Eyes: Pupils are equal, round, and reactive to light.  Neck: Normal range of motion.  Cardiovascular: Normal rate and regular rhythm.   Respiratory: Effort normal and breath sounds normal.  GI: Soft.  Musculoskeletal: Normal range of motion.  Neurological: She is alert and oriented to person, place, and time.  Skin: Skin is warm and dry.  Psychiatric: She has a normal mood and affect. Her behavior is normal.    ED Course  Assessment: Ectopic Pregnancy Cramping Light vaginal bleeding  Interrum Plan:  Methotrexate protocol -CBC -Type and Screen -B-Hcg -AST -BUN -Creatinine   Final Plan  Administration of methotrexate per protocol F/u on Day 4 (5/21)  and 7 (5/24) at hospital for [facility] F/u weekly in office at [place], MSN 05/11/2016 7:50 PM

## 2016-05-11 NOTE — Discharge Instructions (Signed)
Ectopic Pregnancy °An ectopic pregnancy is when the fertilized egg attaches (implants) outside the uterus. Most ectopic pregnancies occur in the fallopian tube. Rarely do ectopic pregnancies occur on the ovary, intestine, pelvis, or cervix. In an ectopic pregnancy, the fertilized egg does not have the ability to develop into a normal, healthy baby.  °A ruptured ectopic pregnancy is one in which the fallopian tube gets torn or bursts and results in internal bleeding. Often there is intense abdominal pain, and sometimes, vaginal bleeding. Having an ectopic pregnancy can be life threatening. If left untreated, this dangerous condition can lead to a blood transfusion, abdominal surgery, or even death. °CAUSES  °Damage to the fallopian tubes is the suspected cause in most ectopic pregnancies.  °RISK FACTORS °Depending on your circumstances, the risk of having an ectopic pregnancy will vary. The level of risk can be divided into three categories. °High Risk °· You have gone through infertility treatment. °· You have had a previous ectopic pregnancy. °· You have had previous tubal surgery. °· You have had previous surgery to have the fallopian tubes tied (tubal ligation). °· You have tubal problems or diseases. °· You have been exposed to DES. DES is a medicine that was used until 1971 and had effects on babies whose mothers took the medicine. °· You become pregnant while using an intrauterine device (IUD) for birth control.  °Moderate Risk °· You have a history of infertility. °· You have a history of a sexually transmitted infection (STI). °· You have a history of pelvic inflammatory disease (PID). °· You have scarring from endometriosis. °· You have multiple sexual partners. °· You smoke.  °Low Risk °· You have had previous pelvic surgery. °· You use vaginal douching. °· You became sexually active before 37 years of age. °SIGNS AND SYMPTOMS  °An ectopic pregnancy should be suspected in anyone who has missed a period and  has abdominal pain or bleeding. °· You may experience normal pregnancy symptoms, such as: °· Nausea. °· Tiredness. °· Breast tenderness. °· Other symptoms may include: °· Pain with intercourse. °· Irregular vaginal bleeding or spotting. °· Cramping or pain on one side or in the lower abdomen. °· Fast heartbeat. °· Passing out while having a bowel movement. °· Symptoms of a ruptured ectopic pregnancy and internal bleeding may include: °· Sudden, severe pain in the abdomen and pelvis. °· Dizziness or fainting. °· Pain in the shoulder area. °DIAGNOSIS  °Tests that may be performed include: °· A pregnancy test. °· An ultrasound test. °· Testing the specific level of pregnancy hormone in the bloodstream. °· Taking a sample of uterus tissue (dilation and curettage, D&C). °· Surgery to perform a visual exam of the inside of the abdomen using a thin, lighted tube with a tiny camera on the end (laparoscope). °TREATMENT  °An injection of a medicine called methotrexate may be given. This medicine causes the pregnancy tissue to be absorbed. It is given if: °· The diagnosis is made early. °· The fallopian tube has not ruptured. °· You are considered to be a good candidate for the medicine. °Usually, pregnancy hormone blood levels are checked after methotrexate treatment. This is to be sure the medicine is effective. It may take 4-6 weeks for the pregnancy to be absorbed (though most pregnancies will be absorbed by 3 weeks). °Surgical treatment may be needed. A laparoscope may be used to remove the pregnancy tissue. If severe internal bleeding occurs, a cut (incision) may be made in the lower abdomen (laparotomy), and the ectopic   pregnancy is removed. This stops the bleeding. Part of the fallopian tube, or the whole tube, may be removed as well (salpingectomy). After surgery, pregnancy hormone tests may be done to be sure there is no pregnancy tissue left. You may receive a Rho (D) immune globulin shot if you are Rh negative and  the father is Rh positive, or if you do not know the Rh type of the father. This is to prevent problems with any future pregnancy. SEEK IMMEDIATE MEDICAL CARE IF:  You have any symptoms of an ectopic pregnancy. This is a medical emergency. MAKE SURE YOU:  Understand these instructions.  Will watch your condition.  Will get help right away if you are not doing well or get worse.   This information is not intended to replace advice given to you by your health care provider. Make sure you discuss any questions you have with your health care provider.   Document Released: 01/18/2005 Document Revised: 01/01/2015 Document Reviewed: 07/10/2013 Elsevier Interactive Patient Education 2016 [place]. Methotrexate Treatment for an Ectopic Pregnancy Methotrexate is a medicine that treats ectopic pregnancy by stopping the growth of the fertilized egg. It also helps your body absorb tissue from the egg. This takes between 2 weeks and 6 weeks. Most ectopic pregnancies can be successfully treated with methotrexate if they are detected early enough. LET [facility] CARE PROVIDER KNOW ABOUT:  Any allergies you have.  All medicines you are taking, including vitamins, herbs, eye drops, creams, and over-the-counter medicines.  Medical conditions you have. RISKS AND COMPLICATIONS Generally, this is a safe treatment. However, as with any treatment, problems can occur. Possible problems or side effects include:  Nausea.  Vomiting.  Diarrhea.  Abdominal cramping.  Mouth sores.  Increased vaginal bleeding or spotting.   Swelling or irritation of the lining of your lungs (pneumonitis).  Failed treatment and continuation of the pregnancy.   Liver damage.  Hair loss. There is still a risk of the ectopic pregnancy rupturing while using the methotrexate. BEFORE THE PROCEDURE Before you take the medicine:   Liver tests, kidney tests, and a complete blood test are performed.  Blood tests  are performed to measure the pregnancy hormone levels and to determine your blood type.  If you are Rh-negative and the father is Rh-positive or his Rh type is not known, you will be given a Rho (D) immune globulin shot. PROCEDURE  There are two methods that your health care provider may use to prescribe methotrexate. One method involves a single dose or injection of the medicine. Another method involves a series of doses given through several injections.  AFTER THE PROCEDURE  You may have some abdominal cramping, vaginal bleeding, and fatigue in the first few days after taking methotrexate.  Blood tests will be taken for several weeks to check the pregnancy hormone levels. The blood tests are performed until there is no more pregnancy hormone detected in the blood.   This information is not intended to replace advice given to you by your health care provider. Make sure you discuss any questions you have with your health care provider.   Document Released: 12/05/2001 Document Revised: 01/01/2015 Document Reviewed: 09/29/2013 Elsevier Interactive Patient Education 2016 [place]. Methotrexate subcutaneous injection What is this medicine? METHOTREXATE (METH oh TREX ate) is a cytotoxic drug that also suppresses the immune system. It is used to treat psoriasis and rheumatoid arthritis. This medicine may be used for other purposes; ask your health care provider or pharmacist if you  have questions. What should I tell my health care provider before I take this medicine? They need to know if you have any of these conditions: -fluid in the stomach area or lungs -if you often drink alcohol -infection or immune system problems -kidney disease -liver disease -low blood counts, like low white cell, platelet, or red cell counts -lung disease -radiation therapy -stomach ulcers -ulcerative colitis -an unusual or allergic reaction to methotrexate, other medicines, foods, dyes, or  preservatives -pregnant or trying to get pregnant -breast-feeding How should I use this medicine? This medicine is for injection under the skin. You will be taught how to prepare and give this medicine. Refer to the Instructions for Use that come with your medication packaging. Use exactly as directed. Take your medicine at regular intervals. Do not take your medicine more often than directed. This medicine should be taken weekly, NOT daily. It is important that you put your used needles and syringes in a special sharps container. Do not put them in a trash can. If you do not have a sharps container, call your pharmacist of healthcare provider to get one. Talk to your pediatrician regarding the use of this medicine in children. While this drug may be prescribed for children as young as 2 years for selected conditions, precautions do apply. Overdosage: If you think you have taken too much of this medicine contact a poison control center or emergency room at once. NOTE: This medicine is only for you. Do not share this medicine with others. What if I miss a dose? If you are not sure if this medicine was injected or if you have a hard time giving the injection, do not inject another dose. Talk with your doctor or health care professional. What may interact with this medicine? This medicine may interact with the following medications: -acitretin -aspirin or aspirin-like medicines including salicylates -azathioprine -certain antibiotics like chloramphenicol, penicillin, tetracycline -cyclosporine -gold -hydroxychloroquine -live virus vaccines -mercaptopurine -NSAIDs, medicines for pain and inflammation, like ibuprofen or naproxen -other cytotoxic agents -penicillamine -phenylbutazone -phenytoin -probenacid -retinoids such as isotretinoin and tretinoin -steroid medicines like prednisone or cortisone -sulfonamides like sulfasalazine and trimethoprim/sulfamethoxazole -theophylline This list  may not describe all possible interactions. Give your health care provider a list of all the medicines, herbs, non-prescription drugs, or dietary supplements you use. Also tell them if you smoke, drink alcohol, or use illegal drugs. Some items may interact with your medicine. What should I watch for while using this medicine? Avoid alcoholic drinks. This medicine can make you more sensitive to the sun. Keep out of the sun. If you cannot avoid being in the sun, wear protective clothing and use sunscreen. Do not use sun lamps or tanning beds/booths. You may get drowsy or dizzy. Do not drive, use machinery, or do anything that needs mental alertness until you know how this medicine affects you. Do not stand or sit up quickly, especially if you are an older patient. This reduces the risk of dizzy or fainting spells. You may need blood work done while you are taking this medicine. Call your doctor or health care professional for advice if you get a fever, chills or sore throat, or other symptoms of a cold or flu. Do not treat yourself. This drug decreases your body's ability to fight infections. Try to avoid being around people who are sick. This medicine may increase your risk to bruise or bleed. Call your doctor or health care professional if you notice any unusual bleeding. Check with your  doctor or health care professional if you get an attack of severe diarrhea, nausea and vomiting, or if you sweat a lot. The loss of too much body fluid can make it dangerous for you to take this medicine. Talk to your doctor about your risk of cancer. You may be more at risk for certain types of cancers if you take this medicine. Both men and women must use effective birth control with this medicine. Do not become pregnant while taking this medicine or until at least 1 normal menstrual cycle has occurred after stopping it. Women should inform their doctor if they wish to become pregnant or think they might be pregnant. Men  should not father a child while taking this medicine and for 3 months after stopping it. There is a potential for serious side effects to an unborn child. Talk to your health care professional or pharmacist for more information. Do not breast-feed an infant while taking this medicine. What side effects may I notice from receiving this medicine? Side effects that you should report to your doctor or health care professional as soon as possible: -allergic reactions like skin rash, itching or hives, swelling of the face, lips, or tongue -breathing problems or shortness of breath -diarrhea -dry, nonproductive cough -low blood counts - this medicine may decrease the number of white blood cells, red blood cells and platelets. You may be at increased risk of infections and bleeding -mouth sores -redness, blistering, peeling or loosening of the skin, including inside the mouth -signs of infection - fever or chills, cough, sore throat, pain or difficulty passing urine -signs and symptoms of bleeding such as bloody or black, tarry stools; red or dark-brown urine; spitting up blood or brown material that looks like coffee grounds; red spots on the skin; unusual bruising or bleeding from the eye, gums, or nose -signs and symptoms of kidney injury like trouble passing urine or change in the amount of urine -signs and symptoms of liver injury like dark yellow or brown urine; general ill feeling or flu-like symptoms; light-colored stools; loss of appetite; nausea; right upper belly pain; unusually weak or tired; yellowing of the eyes or skin Side effects that usually do not require medical attention (Report these to your doctor or health care professional if they continue or are bothersome.): -dizziness -hair loss -headache -stomach pain -upset stomach -vomiting This list may not describe all possible side effects. Call your doctor for medical advice about side effects. You may report side effects to FDA at  1-800-FDA-1088. Where should I keep my medicine? Keep out of the reach of children. You will be instructed on how to store this medicine. Throw away any unused medicine after the expiration date on the label. NOTE: This sheet is a summary. It may not cover all possible information. If you have questions about this medicine, talk to your doctor, pharmacist, or health care provider.    2016, Elsevier/Gold Standard. (2015-04-01 12:39:58)

## 2016-05-11 NOTE — MAU Note (Signed)
Pt presents to MAU from physicians office to rule out an ectopic pregnancy. Pt did have an U/S at the office today. Reports pain in the lower right portion of her abdomen with vaginal bleeding

## 2016-05-11 NOTE — MAU Note (Signed)
Urine collected and sent to lab.

## 2016-05-12 ENCOUNTER — Other Ambulatory Visit: Payer: Self-pay | Admitting: Certified Nurse Midwife

## 2016-05-12 LAB — ABO/RH: ABO/RH(D): A POS

## 2016-05-14 ENCOUNTER — Inpatient Hospital Stay (HOSPITAL_COMMUNITY)
Admission: AD | Admit: 2016-05-14 | Discharge: 2016-05-14 | Disposition: A | Discharge status: Home / Self Care | Payer: 59 | Source: Ambulatory Visit | Attending: Obstetrics and Gynecology | Admitting: Obstetrics and Gynecology

## 2016-05-14 DIAGNOSIS — O001 Tubal pregnancy without intrauterine pregnancy: Secondary | ICD-10-CM | POA: Insufficient documentation

## 2016-05-14 LAB — HCG, QUANTITATIVE, PREGNANCY: hCG, Beta Chain, Quant, S: 10623 m[IU]/mL — ABNORMAL HIGH (ref <5)

## 2016-05-14 NOTE — MAU Note (Signed)
Notified Lavetta Nielsen CNM patient's lab results back.

## 2016-05-14 NOTE — MAU Note (Signed)
Patient here for f/u Day 4 s/p Methotrexate, here for BHCG, denies pain at present, no vaginal bleeding.

## 2016-05-17 ENCOUNTER — Inpatient Hospital Stay (HOSPITAL_COMMUNITY)
Admission: AD | Admit: 2016-05-17 | Discharge: 2016-05-17 | Disposition: A | Discharge status: Home / Self Care | Payer: 59 | Source: Ambulatory Visit | Attending: Obstetrics and Gynecology | Admitting: Obstetrics and Gynecology

## 2016-05-17 DIAGNOSIS — Z09 Encounter for follow-up examination after completed treatment for conditions other than malignant neoplasm: Secondary | ICD-10-CM | POA: Diagnosis present

## 2016-05-17 DIAGNOSIS — I10 Essential (primary) hypertension: Secondary | ICD-10-CM | POA: Diagnosis not present

## 2016-05-17 DIAGNOSIS — Z85850 Personal history of malignant neoplasm of thyroid: Secondary | ICD-10-CM | POA: Diagnosis not present

## 2016-05-17 DIAGNOSIS — G43909 Migraine, unspecified, not intractable, without status migrainosus: Secondary | ICD-10-CM | POA: Diagnosis not present

## 2016-05-17 LAB — HCG, QUANTITATIVE, PREGNANCY: hCG, Beta Chain, Quant, S: 7780 m[IU]/mL — ABNORMAL HIGH (ref <5)

## 2016-05-17 NOTE — MAU Note (Signed)
Feeling ok, no bleeding no pain.

## 2016-05-17 NOTE — MAU Provider Note (Signed)
  History     CSN: [id number]  Arrival date and time: 05/17/16 1351   First Provider Initiated Contact with Patient 05/17/16 1402      Chief Complaint  Patient presents with  . Follow-up   HPI Pt presents for day 7 quant s/p mtx.  She denies any cramping since Sat (4/7) and has not had any spotting for 48hrs.  She says she has felt tired at times but no other sxs.  OB History    Gravida Para Term Preterm AB TAB SAB Ectopic Multiple Living   3 2 2       2       Past Medical History  Diagnosis Date  . Hypertension   . Migraines     Neurology- Dr. [name] Alar  . Allergy   . Thyroid cancer [facility])     Past Surgical History  Procedure Laterality Date  . Left leg surgery    . Gallbladder surgery    . Thyroid removed    . Parathyroid gland      Family History  Problem Relation Age of Onset  . Hypertension Mother   . Diabetes Mother   . Hypertension Paternal Aunt   . Hypertension Paternal Uncle   . Hypertension Maternal Grandmother   . Cancer Maternal Grandfather   . Diabetes Maternal Grandfather   . Parkinsonism Maternal Grandfather   . Alzheimer's disease Maternal Grandfather   . Liver disease Sister     Social History  Substance Use Topics  . Smoking status: Never Smoker   . Smokeless tobacco: Never Used  . Alcohol Use: No    Allergies:  Allergies  Allergen Reactions  . Penicillins Other (See Comments)    Has not had since child, thinks she had reaction to it then    Prescriptions prior to admission  Medication Sig Dispense Refill Last Dose  . levothyroxine (SYNTHROID) 175 MCG tablet Take 1 tablet (175 mcg total) by mouth daily before breakfast. 90 tablet 2 Taking  . Multiple Vitamin (MULTIVITAMIN) LIQD Take 5 mLs by mouth daily.   Taking    ROS  Non-contributory Physical Exam   Blood pressure 123/76, pulse 80, temperature 98.6 F (37 C), temperature source Oral, resp. rate 16, weight 187 lb 6.4 oz (85.004 kg), last menstrual period  04/07/2016.  Physical Exam Lungs CTA CV RRR Abd soft, NT  MAU Course  Procedures  Quant today  Assessment and Plan  P2 s/p MTX and in stable condition.  If quant drops by 15%, then we will monitor weekly quants through the office until negative.  Pt has appt with Dr. Leo Grosser next week.  Precautions reviewed.  Zanyiah Posten Y 05/17/2016, 2:10 PM

## 2016-05-17 NOTE — MAU Note (Signed)
Dr. Mancel Bale informed patient that she may go home after blood draw and Dr will call her with results. Patient left in satisfactory condition.

## 2016-05-28 ENCOUNTER — Inpatient Hospital Stay (HOSPITAL_COMMUNITY): Payer: 59

## 2016-05-28 ENCOUNTER — Inpatient Hospital Stay (HOSPITAL_COMMUNITY)
Admission: AD | Admit: 2016-05-28 | Discharge: 2016-05-28 | Disposition: A | Discharge status: Home / Self Care | Payer: 59 | Source: Ambulatory Visit | Attending: Obstetrics & Gynecology | Admitting: Obstetrics & Gynecology

## 2016-05-28 ENCOUNTER — Encounter (HOSPITAL_COMMUNITY): Payer: Self-pay | Admitting: *Deleted

## 2016-05-28 DIAGNOSIS — Z85850 Personal history of malignant neoplasm of thyroid: Secondary | ICD-10-CM | POA: Insufficient documentation

## 2016-05-28 DIAGNOSIS — Z3A01 Less than 8 weeks gestation of pregnancy: Secondary | ICD-10-CM | POA: Diagnosis not present

## 2016-05-28 DIAGNOSIS — O009 Unspecified ectopic pregnancy without intrauterine pregnancy: Secondary | ICD-10-CM

## 2016-05-28 DIAGNOSIS — O001 Tubal pregnancy without intrauterine pregnancy: Secondary | ICD-10-CM | POA: Diagnosis not present

## 2016-05-28 DIAGNOSIS — R109 Unspecified abdominal pain: Secondary | ICD-10-CM | POA: Diagnosis present

## 2016-05-28 DIAGNOSIS — Z880 Allergy status to penicillin: Secondary | ICD-10-CM | POA: Insufficient documentation

## 2016-05-28 LAB — CBC
HCT: 35 % — ABNORMAL LOW (ref 36.0–46.0)
Hemoglobin: 11.8 g/dL — ABNORMAL LOW (ref 12.0–15.0)
MCH: 28 pg (ref 26.0–34.0)
MCHC: 33.7 g/dL (ref 30.0–36.0)
MCV: 82.9 fL (ref 78.0–100.0)
Platelets: 301 10*3/uL (ref 150–400)
RBC: 4.22 MIL/uL (ref 3.87–5.11)
RDW: 13.2 % (ref 11.5–15.5)
WBC: 5.3 10*3/uL (ref 4.0–10.5)

## 2016-05-28 LAB — HCG, QUANTITATIVE, PREGNANCY: hCG, Beta Chain, Quant, S: 643 m[IU]/mL — ABNORMAL HIGH (ref <5)

## 2016-05-28 LAB — TYPE AND SCREEN
ABO/RH(D): A POS
Antibody Screen: NEGATIVE

## 2016-05-28 MED ORDER — FAMOTIDINE IN NACL 20-0.9 MG/50ML-% IV SOLN: 20.0000 mg | Freq: Once | INTRAVENOUS | Status: DC

## 2016-05-28 NOTE — MAU Note (Signed)
Pt came to [facility] via [facility].  Pt states she is having left sided pain and was told her ectopic was on the right side.  Pt states she is having lower abdominal cramping.  Pt states she has finished her follow up appointment.  Pt states that she was told she was told there was blood in her abdomen when she had an ultrasound.

## 2016-05-28 NOTE — Discharge Instructions (Signed)
Methotrexate Treatment for an Ectopic Pregnancy, Care After Refer to this sheet in the next few weeks. These instructions provide you with information on caring for yourself after your procedure. Your health care provider may also give you more specific instructions. Your treatment has been planned according to current medical practices, but problems sometimes occur. Call your health care provider if you have any problems or questions after your procedure. WHAT TO EXPECT AFTER THE PROCEDURE You may have some abdominal cramping, vaginal bleeding, and fatigue in the first few days after taking methotrexate. Some other possible side effects of methotrexate include:  Nausea.  Vomiting.  Diarrhea.  Mouth sores.  Swelling or irritation of the lining of your lungs (pneumonitis).  Liver damage.  Hair loss. HOME CARE INSTRUCTIONS  After you have received the methotrexate medicine, you need to be careful of your activities and watch your condition for several weeks. It may take 1 week before your hormone levels return to normal.  Keep all follow-up appointments as directed by your health care provider.  Avoid traveling too far away from your health care provider.  Do not have sexual intercourse until your health care provider says it is safe to do so.  You may resume your usual diet.  Limit strenuous activity.  Do not take folic acid, prenatal vitamins, or other vitamins that contain folic acid.  Do not take aspirin, ibuprofen, or naproxen (nonsteroidal anti-inflammatory drugs [NSAIDs]).  Do not drink alcohol. SEEK MEDICAL CARE IF:   You cannot control your nausea and vomiting.  You cannot control your diarrhea.  You have sores in your mouth and want treatment.  You need pain medicine for your abdominal pain.  You have a rash.  You are having a reaction to the medicine. SEEK IMMEDIATE MEDICAL CARE IF:   You have increasing abdominal or pelvic pain.  You notice increased  bleeding.  You feel light-headed, or you faint.  You have shortness of breath.  Your heart rate increases.  You have a cough.  You have chills.  You have a fever.   This information is not intended to replace advice given to you by your health care provider. Make sure you discuss any questions you have with your health care provider.   Document Released: 11/30/2011 Document Revised: 12/16/2013 Document Reviewed: 09/29/2013 Elsevier Interactive Patient Education 2016 [employer].   Constipation, Adult Constipation is when a person:  Poops (has a bowel movement) less than 3 times a week.  Has a hard time pooping.  Has poop that is dry, hard, or bigger than normal. HOME CARE   Eat foods with a lot of fiber in them. This includes fruits, vegetables, beans, and whole grains such as brown rice.  Avoid fatty foods and foods with a lot of sugar. This includes french fries, hamburgers, cookies, candy, and soda.  If you are not getting enough fiber from food, take products with added fiber in them (supplements).  Drink enough fluid to keep your pee (urine) clear or pale yellow.  Exercise on a regular basis, or as told by your doctor.  Go to the restroom when you feel like you need to poop. Do not hold it.  Only take medicine as told by your doctor. Do not take medicines that help you poop (laxatives) without talking to your doctor first. GET HELP RIGHT AWAY IF:   You have bright red blood in your poop (stool).  Your constipation lasts more than 4 days or gets worse.  You have  belly (abdominal) or butt (rectal) pain.  You have thin poop (as thin as a pencil).  You lose weight, and it cannot be explained. MAKE SURE YOU:   Understand these instructions.  Will watch your condition.  Will get help right away if you are not doing well or get worse.   This information is not intended to replace advice given to you by your health care provider. Make sure you discuss any  questions you have with your health care provider.   Document Released: 05/29/2008 Document Revised: 01/01/2015 Document Reviewed: 09/22/2013 Elsevier Interactive Patient Education [employer].

## 2016-05-28 NOTE — MAU Provider Note (Signed)
History     CSN: [id number]  Arrival date and time: 05/28/16 1021   First Provider Initiated Contact with Patient 05/28/16 1043      Chief Complaint  Patient presents with  . Vaginal Bleeding  . Abdominal Pain   HPI  Alexandria [name] is a 35 y.o. G3P2002 at [redacted]w[redacted]d who presents via ems for abdominal pain & vaginal bleeding. Patient was given MTX 5/18 for ectopic pregnancy & is currently being followed in the office with weekly BHCG. States BHCG last Wednesday was 2000.  Reports constipation x 2 days; was on toilet for BM this morning when she heard/felt a pop & passed a lot of blood in the toilet. Lower abdominal pain continued after that episode & patient reports feeling flushed & lightheaded.   OB History    Gravida Para Term Preterm AB TAB SAB Ectopic Multiple Living   3 2 2       2       Past Medical History  Diagnosis Date  . Hypertension   . Migraines     Neurology- Dr. [name] Alar  . Allergy   . Thyroid cancer [facility])     Past Surgical History  Procedure Laterality Date  . Left leg surgery    . Gallbladder surgery    . Thyroid removed    . Parathyroid gland      Family History  Problem Relation Age of Onset  . Hypertension Mother   . Diabetes Mother   . Hypertension Paternal Aunt   . Hypertension Paternal Uncle   . Hypertension Maternal Grandmother   . Cancer Maternal Grandfather   . Diabetes Maternal Grandfather   . Parkinsonism Maternal Grandfather   . Alzheimer's disease Maternal Grandfather   . Liver disease Sister     Social History  Substance Use Topics  . Smoking status: Never Smoker   . Smokeless tobacco: Never Used  . Alcohol Use: No    Allergies:  Allergies  Allergen Reactions  . Penicillins Other (See Comments)    Has not had since child, thinks she had reaction to it then    Prescriptions prior to admission  Medication Sig Dispense Refill Last Dose  . levothyroxine (SYNTHROID) 175 MCG tablet Take 1 tablet (175 mcg total) by mouth daily  before breakfast. 90 tablet 2 Taking  . Multiple Vitamin (MULTIVITAMIN) LIQD Take 5 mLs by mouth daily.   Taking    Review of Systems  Constitutional: Negative.   Respiratory: Negative for cough and shortness of breath.   Cardiovascular: Negative for chest pain.  Gastrointestinal: Positive for abdominal pain and constipation. Negative for nausea, vomiting and diarrhea.  Genitourinary: Negative for dysuria.       + vaginal bleeding   Physical Exam   Blood pressure 119/74, pulse 71, temperature 97.8 F (36.6 C), temperature source Oral, resp. rate 16, last menstrual period 04/07/2016.  Physical Exam  Nursing note and vitals reviewed. Constitutional: She is oriented to person, place, and time. She appears well-developed and well-nourished. No distress.  HENT:  Head: Normocephalic and atraumatic.  Eyes: Conjunctivae are normal. Right eye exhibits no discharge. Left eye exhibits no discharge. No scleral icterus.  Neck: Normal range of motion.  Cardiovascular: Normal rate, regular rhythm and normal heart sounds.   No murmur heard. Respiratory: Effort normal and breath sounds normal. No respiratory distress. She has no wheezes.  GI: Soft. Bowel sounds are normal. There is tenderness in the right lower quadrant and left lower quadrant. There is no rigidity, no  rebound, no guarding, no tenderness at McBurney's point and negative Murphy's sign.  Neurological: She is alert and oriented to person, place, and time.  Skin: Skin is warm and dry. She is not diaphoretic.  Psychiatric: She has a normal mood and affect. Her behavior is normal. Judgment and thought content normal.    MAU Course  Procedures Results for orders placed or performed during the hospital encounter of 05/28/16 (from the past 24 hour(s))  CBC     Status: Abnormal   Collection Time: 05/28/16 10:38 AM  Result Value Ref Range   WBC 5.3 4.0 - 10.5 K/uL   RBC 4.22 3.87 - 5.11 MIL/uL   Hemoglobin 11.8 (L) 12.0 - 15.0 g/dL    HCT 35.0 (L) 36.0 - 46.0 %   MCV 82.9 78.0 - 100.0 fL   MCH 28.0 26.0 - 34.0 pg   MCHC 33.7 30.0 - 36.0 g/dL   RDW 13.2 11.5 - 15.5 %   Platelets 301 150 - 400 K/uL  hCG, quantitative, pregnancy     Status: Abnormal   Collection Time: 05/28/16 10:38 AM  Result Value Ref Range   hCG, Beta Chain, Quant, S 643 (H) <5 mIU/mL  Type and screen     Status: None   Collection Time: 05/28/16 10:38 AM  Result Value Ref Range   ABO/RH(D) A POS    Antibody Screen NEG    Sample Expiration 05/31/2016    US Ob Comp Less 14 Wks  05/28/2016  CLINICAL DATA:  Vaginal bleeding. Recent ectopic pregnancy treated with methotrexate. EXAM: OBSTETRIC <14 WK [place] AND TRANSVAGINAL OB US TECHNIQUE: Both transabdominal and transvaginal ultrasound examinations were performed for complete evaluation of the gestation as well as the maternal uterus, adnexal regions, and pelvic cul-de-sac. Transvaginal technique was performed to assess early pregnancy. COMPARISON:  None. FINDINGS: No intrauterine gestational sac or other fluid collection visualized in endometrial cavity. Endometrial thickness measures 8 mm. No fibroids identified. The left ovary is normal in appearance. The right ovary contains a small approximately 1.4 cm hemorrhagic corpus luteum cyst, but is otherwise normal appearance. No separate adnexal masses are identified. No evidence of free fluid. IMPRESSION: Pregnancy location not visualized sonographically. No definite adnexal mass or free fluid identified. Recommend close follow up of quantitative B-HCG levels, and follow up US if clinically warranted. Electronically Signed   By: Earle Gell M.D.   On: 05/28/2016 11:37   US Ob Transvaginal  05/28/2016  CLINICAL DATA:  Vaginal bleeding. Recent ectopic pregnancy treated with methotrexate. EXAM: OBSTETRIC <14 WK [place] AND TRANSVAGINAL OB US TECHNIQUE: Both transabdominal and transvaginal ultrasound examinations were performed for complete evaluation of the gestation as well as  the maternal uterus, adnexal regions, and pelvic cul-de-sac. Transvaginal technique was performed to assess early pregnancy. COMPARISON:  None. FINDINGS: No intrauterine gestational sac or other fluid collection visualized in endometrial cavity. Endometrial thickness measures 8 mm. No fibroids identified. The left ovary is normal in appearance. The right ovary contains a small approximately 1.4 cm hemorrhagic corpus luteum cyst, but is otherwise normal appearance. No separate adnexal masses are identified. No evidence of free fluid. IMPRESSION: Pregnancy location not visualized sonographically. No definite adnexal mass or free fluid identified. Recommend close follow up of quantitative B-HCG levels, and follow up US if clinically warranted. Electronically Signed   By: Earle Gell M.D.   On: 05/28/2016 11:37    MDM A positive Pt reports marked improvement in symptoms since given fentanyl by EMS Minimal bleeding on pad  Orthostatic VS wnl Hgb stable at 11.8 BHCG down -- 2000> 643 Ultrasound shows no IUP or adnexal mass (which was previously seen on u/s in office) -- no free fluid S/w Dr. Alesia Richards -- ok to discharge home & ensure pt has f/u on Wednesday Assessment and Plan  A: 1. Ectopic pregnancy     P; Discharge home Pt reports having adequate supply of pain medication Pt states has appt scheduled on Wednesday for BHCG Discussed reasons to return to MAU or call office  Jorje Guild 05/28/2016, 10:43 AM

## 2016-07-21 ENCOUNTER — Other Ambulatory Visit: Payer: Self-pay | Admitting: *Deleted

## 2016-07-21 MED ORDER — TRIAMTERENE-HCTZ 37.5-25 MG PO TABS: 1.0000 | ORAL_TABLET | Freq: Every day | ORAL | 3 refills | Status: DC

## 2016-07-21 NOTE — Telephone Encounter (Signed)
Received fax requesting refill on triamterene HCTZ.   Prescription sent to pharmacy.

## 2016-10-11 ENCOUNTER — Encounter: Payer: Self-pay | Admitting: Family Medicine

## 2016-10-11 ENCOUNTER — Ambulatory Visit (INDEPENDENT_AMBULATORY_CARE_PROVIDER_SITE_OTHER): Payer: 59 | Admitting: Family Medicine

## 2016-10-11 VITALS — BP 128/64 | HR 74 | Temp 97.1°F | Resp 14 | Ht 65.0 in | Wt 195.0 lb

## 2016-10-11 DIAGNOSIS — E6609 Other obesity due to excess calories: Secondary | ICD-10-CM

## 2016-10-11 DIAGNOSIS — Z23 Encounter for immunization: Secondary | ICD-10-CM | POA: Diagnosis not present

## 2016-10-11 DIAGNOSIS — I10 Essential (primary) hypertension: Secondary | ICD-10-CM

## 2016-10-11 DIAGNOSIS — Z0000 Encounter for general adult medical examination without abnormal findings: Secondary | ICD-10-CM

## 2016-10-11 DIAGNOSIS — E66811 Obesity, class 1: Secondary | ICD-10-CM

## 2016-10-11 DIAGNOSIS — E669 Obesity, unspecified: Secondary | ICD-10-CM | POA: Insufficient documentation

## 2016-10-11 DIAGNOSIS — R7303 Prediabetes: Secondary | ICD-10-CM | POA: Diagnosis not present

## 2016-10-11 DIAGNOSIS — E559 Vitamin D deficiency, unspecified: Secondary | ICD-10-CM

## 2016-10-11 DIAGNOSIS — E039 Hypothyroidism, unspecified: Secondary | ICD-10-CM | POA: Diagnosis not present

## 2016-10-11 DIAGNOSIS — Z6832 Body mass index (BMI) 32.0-32.9, adult: Secondary | ICD-10-CM | POA: Diagnosis not present

## 2016-10-11 LAB — CBC WITH DIFFERENTIAL/PLATELET
Basophils Absolute: 0 cells/uL (ref 0–200)
Basophils Relative: 0 %
Eosinophils Absolute: 94 cells/uL (ref 15–500)
Eosinophils Relative: 2 %
HCT: 42.8 % (ref 35.0–45.0)
Hemoglobin: 13.9 g/dL (ref 12.0–15.0)
Lymphocytes Relative: 42 %
Lymphs Abs: 1974 cells/uL (ref 850–3900)
MCH: 27.6 pg (ref 27.0–33.0)
MCHC: 32.5 g/dL (ref 32.0–36.0)
MCV: 84.9 fL (ref 80.0–100.0)
MPV: 10 fL (ref 7.5–12.5)
Monocytes Absolute: 376 cells/uL (ref 200–950)
Monocytes Relative: 8 %
Neutro Abs: 2256 cells/uL (ref 1500–7800)
Neutrophils Relative %: 48 %
Platelets: 334 10*3/uL (ref 140–400)
RBC: 5.04 MIL/uL (ref 3.80–5.10)
RDW: 13.4 % (ref 11.0–15.0)
WBC: 4.7 10*3/uL (ref 3.8–10.8)

## 2016-10-11 LAB — COMPREHENSIVE METABOLIC PANEL
ALT: 20 U/L (ref 6–29)
AST: 17 U/L (ref 10–30)
Albumin: 4.2 g/dL (ref 3.6–5.1)
Alkaline Phosphatase: 82 U/L (ref 33–115)
BUN: 16 mg/dL (ref 7–25)
CO2: 27 mmol/L (ref 20–31)
Calcium: 9.4 mg/dL (ref 8.6–10.2)
Chloride: 106 mmol/L (ref 98–110)
Creat: 1.24 mg/dL — ABNORMAL HIGH (ref 0.50–1.10)
Glucose, Bld: 94 mg/dL (ref 70–99)
Potassium: 3.9 mmol/L (ref 3.5–5.3)
Sodium: 139 mmol/L (ref 135–146)
Total Bilirubin: 0.5 mg/dL (ref 0.2–1.2)
Total Protein: 7.1 g/dL (ref 6.1–8.1)

## 2016-10-11 LAB — LIPID PANEL
Cholesterol: 155 mg/dL (ref 125–200)
HDL: 60 mg/dL (ref >=46)
LDL Cholesterol: 84 mg/dL (ref <130)
Total CHOL/HDL Ratio: 2.6 Ratio (ref <=5.0)
Triglycerides: 53 mg/dL (ref <150)
VLDL: 11 mg/dL (ref <30)

## 2016-10-11 LAB — TSH: TSH: 0.01 mIU/L — ABNORMAL LOW

## 2016-10-11 LAB — T4, FREE: Free T4: 1.6 ng/dL (ref 0.8–1.8)

## 2016-10-11 LAB — T3, FREE: T3, Free: 3.2 pg/mL (ref 2.3–4.2)

## 2016-10-11 MED ORDER — TRIAMTERENE-HCTZ 37.5-25 MG PO TABS: 1.0000 | ORAL_TABLET | Freq: Every day | ORAL | 6 refills | Status: DC

## 2016-10-11 NOTE — Progress Notes (Signed)
Subjective:    Patient ID: Alexandria [name], female    DOB: 21-Nov-1981, 35 y.o.   MRN: [id number]  Patient presents for CPE (is fasting- has GYN for abnormal bleeding- has had recent miscarriage) and Referral (would like referral to endo in [place]- does not want to go to Dr. Suzette Battiest) Patient for complete physical exam. She does have a GYN. She currently has a Mirena IUD continues to have problems with bleeding therefore she was given estradiol to help with this. She thought that this would help with her migraines which worsen around her menstrual cycle but it has not.  She still follows with a neurologist regarding her migraine she is on sonogram which helps some but not during her cycle.  Endocrinology she has history of hypothyroidism has had thyroid cancer she is currently being followed by [facility] endocrinology but since things have been stable in the distance she would like to transfer to a local endocrinologist. She is overdue for check on her thyroid function studies. Fourthly she has gained 12 pounds since our last visit but this is actually been due to very poor diet she eats fast food almost daily because of her work schedule and her children schedule.   Flu shot needed   Review Of Systems:  GEN- denies fatigue, fever, weight loss,weakness, recent illness HEENT- denies eye drainage, change in vision, nasal discharge, CVS- denies chest pain, palpitations RESP- denies SOB, cough, wheeze ABD- denies N/V, change in stools, abd pain GU- denies dysuria, hematuria, dribbling, incontinence MSK- denies joint pain, muscle aches, injury Neuro- denies headache, dizziness, syncope, seizure activity       Objective:    BP 128/64 (BP Location: Left Arm, Patient Position: Sitting, Cuff Size: Large)   Pulse 74   Temp 97.1 F (36.2 C) (Oral)   Resp 14   Ht 5\' 5"  (1.651 m)   Wt 195 lb (88.5 kg)   LMP 03/20/2016   Breastfeeding? No   BMI 32.45 kg/m  GEN- NAD, alert and oriented x3 HEENT-  PERRL, EOMI, non injected sclera, pink conjunctiva, MMM, oropharynx clear Neck- Supple, no LAD  CVS- RRR, no murmur RESP-CTAB ABD-NABS,soft,NT,ND EXT- No edema Pulses- Radial, DP- 2+        Assessment & Plan:      Problem List Items Addressed This Visit    Vitamin D deficiency   Relevant Orders   Vitamin D, 25-hydroxy   Prediabetes    She has history of borderline diabetes now with her weight gain she needs a repeat 123456 discussed implications of her fast food eating poor rest      Relevant Orders   Hemoglobin A1c   Obesity   Hypothyroidism (acquired)    History of papillary thyroid carcinoma status post removal lifelong thyroid replacement. Referral to endocrinology for further monitoring      Relevant Orders   T4, free   TSH   T3, free   Ambulatory referral to Endocrinology   Hypertension    Controlled on current dose of Maxzide no changes      Relevant Medications   triamterene-hydrochlorothiazide (MAXZIDE-25) 37.5-25 MG tablet    Other Visit Diagnoses    Routine general medical examination at a health care facility    -  Primary   CPE done, fasting labs, flu shot   Relevant Orders   CBC with Differential/Platelet   Comprehensive metabolic panel   Lipid panel   Need for prophylactic vaccination and inoculation against influenza  Relevant Orders   Flu Vaccine QUAD 36+ mos PF IM (Fluarix & Fluzone Quad PF) (Completed)      Note: This dictation was prepared with Dragon dictation along with smaller phrase technology. Any transcriptional errors that result from this process are unintentional.

## 2016-10-11 NOTE — Assessment & Plan Note (Signed)
Controlled on current dose of Maxzide no changes

## 2016-10-11 NOTE — Assessment & Plan Note (Signed)
History of papillary thyroid carcinoma status post removal lifelong thyroid replacement. Referral to endocrinology for further monitoring

## 2016-10-11 NOTE — Patient Instructions (Addendum)
Referral to endocrinology  We will call with lab results  Flu shot  F/U 6 months

## 2016-10-11 NOTE — Assessment & Plan Note (Signed)
She has history of borderline diabetes now with her weight gain she needs a repeat 123456 discussed implications of her fast food eating poor rest

## 2016-10-12 LAB — VITAMIN D 25 HYDROXY (VIT D DEFICIENCY, FRACTURES): Vit D, 25-Hydroxy: 23 ng/mL — ABNORMAL LOW (ref 30–100)

## 2016-10-12 LAB — HEMOGLOBIN A1C
Hgb A1c MFr Bld: 5.5 % (ref <5.7)
Mean Plasma Glucose: 111 mg/dL

## 2016-10-13 ENCOUNTER — Other Ambulatory Visit: Payer: Self-pay | Admitting: *Deleted

## 2016-10-13 DIAGNOSIS — N289 Disorder of kidney and ureter, unspecified: Secondary | ICD-10-CM

## 2016-12-27 ENCOUNTER — Ambulatory Visit: Payer: 59 | Admitting: Internal Medicine

## 2017-01-30 ENCOUNTER — Encounter: Payer: Self-pay | Admitting: Internal Medicine

## 2017-01-30 ENCOUNTER — Ambulatory Visit (INDEPENDENT_AMBULATORY_CARE_PROVIDER_SITE_OTHER): Payer: 59 | Admitting: Internal Medicine

## 2017-01-30 VITALS — BP 130/88 | HR 70 | Ht 64.5 in | Wt 202.0 lb

## 2017-01-30 DIAGNOSIS — C73 Malignant neoplasm of thyroid gland: Secondary | ICD-10-CM | POA: Diagnosis not present

## 2017-01-30 DIAGNOSIS — E890 Postprocedural hypothyroidism: Secondary | ICD-10-CM

## 2017-01-30 LAB — T4, FREE: Free T4: 1.64 ng/dL — ABNORMAL HIGH (ref 0.60–1.60)

## 2017-01-30 LAB — TSH: TSH: 0.01 u[IU]/mL — ABNORMAL LOW (ref 0.35–4.50)

## 2017-01-30 NOTE — Progress Notes (Signed)
Patient ID: Alexandria [name], female   DOB: 24-Nov-1981, 36 y.o.   MRN: [id number]    HPI  Alexandria [name] is a 36 y.o.-year-old female, referred by her PCP, Dr. Buelah Manis, for management of thyroid cancer and postsurgical hypothyroidism.  Pt. has been dx with thyroid cancer in 11/2006. I reviewed the notes from [facility] (Dr. Clarita Leber):  Patient was found to have a right thyroid nodule in 11/2016. 12/2006: Thyroidectomy with central lymph node dissection >> 2 foci of PTC: 4.5 cm and 1.5 cm in the right lobe with 11 lymph nodes positive for metastasis 01/2007: Tg 483, ATA positive 01/2007: RAI treatment 158 mCi 01/2007: Posttreatment whole-body scan with uptake only in the thyroid bed 02/2007: Another whole-body scan: Negative for metastasis 03/2007: Tg 48 (? If stimulated), ATA negative 05/2007: Tg 36 (? If stimulated), ATA negative 02/2008: Tg 128 (? If stimulated), ATA negative 03/2008: FNA of lymph node in the right neck: Positive for metastasis 05/2008: dissection of levels 2, 3, 4 lymph nodes: 12 out of 26 positive, largest being 2.1 cm with extracapsular extension 07/2008: Whole-body scan: Negative for metastasis 07/2008: RAI treatment 209 mCi (because of increasing thyroglobulin) 07/2008: Posttreatment whole-body scan negative for metastasis 03/2009: Whole-body scan: negative for metastases 08/2009: FNA of lymph nodes in the right and left neck: Negative  03/2010: FNA of lymph node in the right neck: Negative 06/2010: Stimulated Tg 4.5, ATA negative 12/2010: Neck ultrasound: Small, subcentimeter lymph nodes, not worrisome 01/2012: Neck ultrasound: Small, subcentimeter lymph nodes, not worrisome 12/2012: Neck ultrasound: Small, subcentimeter lymph nodes, not worrisome 11/24/2015: Lymph node ultrasound mapping:  R3 lymph node with slightly thickened cortex R2 lymph node with slight decrease in size, with echogenic focus (may be calcification - needs attention L7 lymph node  enlarged, with oval contour, may be slightly smaller since 2013 - needs attention  11/24/2015: FNA of lymph node in the right neck (R3): Negative  Also, see below:   Dr. Frederik Pear advise her not to have any more Thyrogen-stimulated thyroglobulin levels or whole-body scans to avoid any stimulation of tumor growth. She also suggested that the patient may need PET/CT if the thyroglobulin increases.  Pt denies feeling nodules in neck, hoarseness, dysphagia/odynophagia.  For her postsurgical hypothyroidism, she is on Synthroid DAW 175 mcg, taken: - mostly (!) fasting - along with BP meds - with water - separated by >30 min from b'fast  - no calcium, iron, PPIs - + multivitamins 1-1.5 h later  She was maintained with suppressed TSH levels after her surgery.  I reviewed pt's thyroid tests: Lab Results  Component Value Date   TSH 0.01 (L) 10/11/2016   TSH <0.008 (L) 04/05/2015   TSH 0.040 (L) 12/01/2014   TSH 0.010 (L) 02/25/2014   FREET4 1.6 10/11/2016   FREET4 1.69 04/05/2015   FREET4 1.55 12/01/2014   FREET4 1.63 02/25/2014    Pt describes: - + fatigue - + weight gain - + cold intolerance - + anxiety and depression  Denies: - constipation - dry skin - hair loss  On Mirena IUD. Has been on NuvaRing, OCPs. She has hormonal migraines. She had a miscarriage last year. She is not interested in a future pregnancy.  She has no FH of thyroid disorders. No FH of thyroid cancer.  No h/o radiation tx to head or neck.  ROS: Constitutional: + see HPI Eyes: no blurry vision, no xerophthalmia ENT: no sore throat, + see HPI Cardiovascular: no CP/SOB/palpitations/leg swelling Respiratory: no cough/SOB Gastrointestinal: +  N (with migraines)/no V/D/C Musculoskeletal: no muscle/+ joint aches Skin: no rashes Neurological: no tremors/numbness/tingling/dizziness, + migraines Psychiatric: + both: depression/anxiety  Past Medical History:  Diagnosis Date  . Allergy   . Hypertension   .  Migraines    Neurology- Dr. [name] Alar  . Thyroid cancer [facility])    Past Surgical History:  Procedure Laterality Date  . GALLBLADDER SURGERY    . left leg surgery    . parathyroid gland    . thyroid removed     Social History   Social History  . Marital status: Married    Spouse name: N/A  . Number of children: 2   Occupational History  . mental health counselor   Social History Main Topics  . Smoking status: Never Smoker  . Smokeless tobacco: Never Used  . Alcohol use No  . Drug use: No  . Sexual activity: Yes    Birth control/ protection: IUD   Current Outpatient Prescriptions on File Prior to Visit  Medication Sig Dispense Refill  . levonorgestrel (MIRENA) 20 MCG/24HR IUD 1 each by Intrauterine route once.    Marland Kitchen levothyroxine (SYNTHROID) 175 MCG tablet Take 1 tablet (175 mcg total) by mouth daily before breakfast. 90 tablet 2  . triamterene-hydrochlorothiazide (MAXZIDE-25) 37.5-25 MG tablet Take 1 tablet by mouth daily. 30 tablet 6  . zonisamide (ZONEGRAN) 100 MG capsule Take 100 mg by mouth as needed (headache).     No current facility-administered medications on file prior to visit.    Allergies  Allergen Reactions  . Penicillins Other (See Comments)    Has not had since child, thinks she had reaction to it then   Family History  Problem Relation Age of Onset  . Hypertension Mother   . Diabetes Mother   . Hypertension Paternal Aunt   . Hypertension Paternal Uncle   . Hypertension Maternal Grandmother   . Cancer Maternal Grandfather   . Diabetes Maternal Grandfather   . Parkinsonism Maternal Grandfather   . Alzheimer's disease Maternal Grandfather   . Liver disease Sister    PE: BP 130/88 (BP Location: Left Arm, Patient Position: Sitting)   Pulse 70   Ht 5' 4.5" (1.638 m)   Wt 202 lb (91.6 kg)   LMP 04/07/2016   BMI 34.14 kg/m  Wt Readings from Last 3 Encounters:  01/30/17 202 lb (91.6 kg)  10/11/16 195 lb (88.5 kg)  05/17/16 187 lb 6.4 oz (85 kg)    Constitutional: Obese, in NAD Eyes: PERRLA, EOMI, no exophthalmos ENT: moist mucous membranes, thyroid scar healed, no cervical lymphadenopathy, no neck masses felt Cardiovascular: RRR, No MRG Respiratory: CTA B Gastrointestinal: abdomen soft, NT, ND, BS+ Musculoskeletal: no deformities, strength intact in all 4 Skin: moist, warm, no rashes Neurological: no tremor with outstretched hands, DTR normal in all 4  ASSESSMENT: 1. Thyroid cancer - see HPI  2. Postsurgical Hypothyroidism  PLAN:  1. Thyroid cancer - papillary - I had a long discussion with the patient about her thyroid cancer. We reviewed together the pathology >> she is stage 1 TNM due to age (path: T3N1M0) - I reassured her that papillary thyroid cancer is a slow growing cancer with good prognosis; her life expectancy is unlikely to be reduced due to the cancer.  - we will check thyroglobulin and Tg antibodies today; since her ATA antibodies are positive in the past, will check a thyroglobulin by the [facility] assay. - I will see the patient back in 6 mo  2. Patient with h/o  total thyroidectomy, now with iatrogenic hypothyroidism, on levothyroxine therapy (Synthroid DAW 175 mcg daily). She appears euthyroid, but she complains of fatigue, weight gain, heavier menses. She does not appear to have neck masses, enlarged cervical lymph nodes, or neck compression symptoms. Her cervical scar is completely healed. - We discussed about correct intake of levothyroxine, fasting, with water, separated by at least 30 minutes from breakfast, and separated by more than 4 hours from calcium, iron, multivitamins, acid reflux medications (PPIs).She is taking it correctly, but occasionally takes it after a meal, which I advised her not to do. - will check thyroid tests today: TSH, free T4 - target TSH: LLN, but I would not recommend to push the target lower than normal. She agrees with this. - I plan to repeat a neck ultrasound at next visit in 6  months  Component     Latest Ref Rng & Units 01/30/2017  TSH     0.35 - 4.50 uIU/mL 0.01 (L)  T4,Free(Direct)     0.60 - 1.60 ng/dL 1.64 (H)  Thyroglobulin Antibody     0.0 - 0.9 IU/mL <1.0  Thyroglobulin by IMA     1.5 - 38.5 ng/mL 1.4 (L)   Thyroglobulin is slightly higher than before, but this could be due to assay variability. Thyroid lab in antibodies are negative. TSH is suppressed. I will advise her to decrease the dose of Synthroid to 150 g daily and repeat her tests in 1.5 months.  Philemon Kingdom, MD PhD [facility]

## 2017-01-30 NOTE — Patient Instructions (Addendum)
Please stop at the lab.  Please continue Levothyroxine 175 mcg daily.  Take the thyroid hormone every day, with water, at least 30 minutes before breakfast, separated by at least 4 hours from: - acid reflux medications - calcium - iron - multivitamins  Please come back for a follow-up appointment in 6 months.  

## 2017-01-31 LAB — TGAB+THYROGLOBULIN IMA OR LCMS: Thyroglobulin Antibody: <1 IU/mL (ref 0.0–0.9)

## 2017-01-31 LAB — THYROGLOBULIN BY IMA: Thyroglobulin by IMA: 1.4 ng/mL — ABNORMAL LOW (ref 1.5–38.5)

## 2017-02-01 ENCOUNTER — Other Ambulatory Visit: Payer: Self-pay | Admitting: Internal Medicine

## 2017-02-01 ENCOUNTER — Telehealth: Payer: Self-pay

## 2017-02-01 ENCOUNTER — Telehealth: Payer: Self-pay | Admitting: Internal Medicine

## 2017-02-01 DIAGNOSIS — E890 Postprocedural hypothyroidism: Secondary | ICD-10-CM

## 2017-02-01 MED ORDER — SYNTHROID 150 MCG PO TABS: 150.0000 ug | ORAL_TABLET | Freq: Every day | ORAL | 1 refills | Status: DC

## 2017-02-01 NOTE — Telephone Encounter (Signed)
Assay variability means that we are now using a different method to check the thyroglobulin c/w [facility] lab. That is why, I would consider this Thyroglobulin a new starting point and continue to check it with the same method in the future to see the trend. If it remains as low as now, this is good news. If it starts increasing, we have to go and look for possible sources (by imaging tests). I would not expect any side effects, but, as I discussed with her at last visit, she may feel more tired until her body get's used to the new hormone level. I actually suspect that she will feel a Ha better since we are bringing the level closer to normal.

## 2017-02-01 NOTE — Telephone Encounter (Signed)
Patient ask you to give her a call concerning her results she has some questions.

## 2017-02-01 NOTE — Telephone Encounter (Signed)
Patient received mychart message, and has some questions. Patient is asking what does assay variability mean that could case her thyroglobulin to be high. Patient also asked with the decrease in medication which side effects can she look to see with the different dose. Please advise. Thank you!

## 2017-02-01 NOTE — Telephone Encounter (Signed)
Questions submitted to MD to review.

## 2017-02-02 ENCOUNTER — Telehealth: Payer: Self-pay

## 2017-02-02 NOTE — Telephone Encounter (Signed)
Called and notified patient of Dr.Gherghe's note. Patient had no other questions at this time.

## 2017-02-13 ENCOUNTER — Encounter: Payer: Self-pay | Admitting: Internal Medicine

## 2017-02-13 DIAGNOSIS — G43839 Menstrual migraine, intractable, without status migrainosus: Secondary | ICD-10-CM | POA: Diagnosis not present

## 2017-02-13 DIAGNOSIS — G43719 Chronic migraine without aura, intractable, without status migrainosus: Secondary | ICD-10-CM | POA: Diagnosis not present

## 2017-02-26 ENCOUNTER — Encounter: Payer: Self-pay | Admitting: Family Medicine

## 2017-02-26 ENCOUNTER — Ambulatory Visit (INDEPENDENT_AMBULATORY_CARE_PROVIDER_SITE_OTHER): Payer: 59 | Admitting: Family Medicine

## 2017-02-26 VITALS — BP 128/82 | HR 80 | Temp 98.2°F | Resp 16 | Ht 64.5 in | Wt 204.0 lb

## 2017-02-26 DIAGNOSIS — J029 Acute pharyngitis, unspecified: Secondary | ICD-10-CM | POA: Diagnosis not present

## 2017-02-26 DIAGNOSIS — B349 Viral infection, unspecified: Secondary | ICD-10-CM

## 2017-02-26 LAB — STREP GROUP A AG, W/REFLEX TO CULT: STREGTOCOCCUS GROUP A AG SCREEN: NOT DETECTED

## 2017-02-26 NOTE — Progress Notes (Signed)
   Subjective:    Patient ID: Alexandria [name], female    DOB: April 01, 1981, 36 y.o.   MRN: [id number]  Patient presents for Illness (x3 days- sneezing, body aches, joint pains, fever/ chills, sore throat, hoarse, productive cough)    Sat started with sneezing,sore throat,mild  cough started on Sunday with body aches,fever, chills, joint pain. Has used zinc lozenges, took homeopathic teas, dayquil. Felt a Biebel better this morning, throat still very scratchy and sore and ear pain. Muscle aches resolved.  No further cough  Husband has been sick and off   +Flu shot   Review Of Systems:  GEN- denies fatigue, fever, weight loss,weakness, recent illness HEENT- denies eye drainage, change in vision, +nasal discharge, CVS- denies chest pain, palpitations RESP- denies SOB, c+ough, wheeze ABD- denies N/V, change in stools, abd pain GU- denies dysuria, hematuria, dribbling, incontinence MSK- denies joint pain, muscle aches, injury Neuro- denies headache, dizziness, syncope, seizure activity       Objective:    BP 128/82   Pulse 80   Temp 98.2 F (36.8 C) (Oral)   Resp 16   Ht 5' 4.5" (1.638 m)   Wt 204 lb (92.5 kg)   LMP 04/07/2016   SpO2 98%   BMI 34.48 kg/m  GEN- NAD, alert and oriented x3 HEENT- PERRL, EOMI, non injected sclera, pink conjunctiva, MMM, oropharynx mild injection, TM clear bilat no effusion,  No maxillary sinus tenderness, + clear  Nasal drainage  Neck- Supple, shotty  LAD CVS- RRR, no murmur RESP-CTAB EXT- No edema Pulses- Radial 2+         Assessment & Plan:      Problem List Items Addressed This Visit    None    Visit Diagnoses    Pharyngitis, unspecified etiology    -  Primary   Strep neg, viral URI, supportive care, throat lozenges, salt water gargle ,nasal saline   Relevant Orders   STREP GROUP A AG, W/REFLEX TO CULT   Viral illness          Note: This dictation was prepared with Dragon dictation along with smaller phrase technology. Any  transcriptional errors that result from this process are unintentional.

## 2017-02-26 NOTE — Patient Instructions (Signed)
Gargle salt water Use nasal saline rinse F/U as needed

## 2017-02-28 LAB — CULTURE, GROUP A STREP

## 2017-03-08 DIAGNOSIS — G43109 Migraine with aura, not intractable, without status migrainosus: Secondary | ICD-10-CM | POA: Diagnosis not present

## 2017-03-08 DIAGNOSIS — G43829 Menstrual migraine, not intractable, without status migrainosus: Secondary | ICD-10-CM | POA: Diagnosis not present

## 2017-03-15 ENCOUNTER — Other Ambulatory Visit (INDEPENDENT_AMBULATORY_CARE_PROVIDER_SITE_OTHER): Payer: 59

## 2017-03-15 ENCOUNTER — Telehealth: Payer: Self-pay

## 2017-03-15 DIAGNOSIS — E890 Postprocedural hypothyroidism: Secondary | ICD-10-CM

## 2017-03-15 LAB — TSH: TSH: 0.01 u[IU]/mL — ABNORMAL LOW (ref 0.35–4.50)

## 2017-03-15 LAB — T4, FREE: Free T4: 1.42 ng/dL (ref 0.60–1.60)

## 2017-03-15 MED ORDER — SYNTHROID 125 MCG PO TABS: 125.0000 ug | ORAL_TABLET | Freq: Every day | ORAL | 1 refills | Status: DC

## 2017-03-15 NOTE — Telephone Encounter (Signed)
-----   Message from Philemon Kingdom, MD sent at 03/15/2017  3:13 PM EDT ----- Alexandria [name], can you please send her a message to my chart or call her: TSH is still low, so we need to decrease her Synthroid d.a.w. further to 125 g daily (can you please send this to her pharmacy) and have her back for labs: TSH and [name] T4 in 1.5 months.

## 2017-03-15 NOTE — Telephone Encounter (Signed)
Called patient and gave lab results. Patient had no questions or concerns.  

## 2017-04-28 ENCOUNTER — Other Ambulatory Visit: Payer: Self-pay | Admitting: Family Medicine

## 2017-05-08 ENCOUNTER — Other Ambulatory Visit: Payer: Self-pay

## 2017-05-08 MED ORDER — SYNTHROID 125 MCG PO TABS: 125.0000 ug | ORAL_TABLET | Freq: Every day | ORAL | 1 refills | Status: DC

## 2017-05-11 ENCOUNTER — Other Ambulatory Visit: Payer: 59

## 2017-05-11 ENCOUNTER — Other Ambulatory Visit (INDEPENDENT_AMBULATORY_CARE_PROVIDER_SITE_OTHER): Payer: 59

## 2017-05-11 DIAGNOSIS — E890 Postprocedural hypothyroidism: Secondary | ICD-10-CM | POA: Diagnosis not present

## 2017-05-11 LAB — TSH: TSH: 0.04 u[IU]/mL — ABNORMAL LOW (ref 0.35–4.50)

## 2017-05-11 LAB — T4, FREE: Free T4: 1.52 ng/dL (ref 0.60–1.60)

## 2017-05-13 ENCOUNTER — Encounter: Payer: Self-pay | Admitting: Internal Medicine

## 2017-05-14 ENCOUNTER — Telehealth: Payer: Self-pay

## 2017-05-14 MED ORDER — SYNTHROID 112 MCG PO TABS: 112.0000 ug | ORAL_TABLET | Freq: Every day | ORAL | 1 refills | Status: DC

## 2017-05-14 NOTE — Telephone Encounter (Signed)
Called patient and gave lab results. Patient had no questions or concerns.  

## 2017-05-14 NOTE — Telephone Encounter (Signed)
-----   Message from Philemon Kingdom, MD sent at 05/11/2017  5:35 PM EDT ----- Alexandria [name], can you please call pt: Alexandria [name], can you please send her a message to my chart or call her: TSH is still low, so we need to decrease her Synthroid d.a.w. further to 112 g daily (can you please send this to her pharmacy) and I can recheck her labs when she comes back in August.

## 2017-05-14 NOTE — Telephone Encounter (Signed)
-----   Message from Philemon Kingdom, MD sent at 05/11/2017  5:35 PM EDT ----- Almyra Free, can you please call pt: Almyra Free, can you please send her a message to my chart or call her: TSH is still low, so we need to decrease her Synthroid d.a.w. further to 112 g daily (can you please send this to her pharmacy) and I can recheck her labs when she comes back in August.

## 2017-05-15 ENCOUNTER — Other Ambulatory Visit: Payer: Self-pay | Admitting: *Deleted

## 2017-05-15 DIAGNOSIS — N920 Excessive and frequent menstruation with regular cycle: Secondary | ICD-10-CM | POA: Diagnosis not present

## 2017-05-15 DIAGNOSIS — G43829 Menstrual migraine, not intractable, without status migrainosus: Secondary | ICD-10-CM | POA: Diagnosis not present

## 2017-05-15 DIAGNOSIS — Z01419 Encounter for gynecological examination (general) (routine) without abnormal findings: Secondary | ICD-10-CM | POA: Diagnosis not present

## 2017-05-15 MED ORDER — TRIAMTERENE-HCTZ 37.5-25 MG PO TABS: 1.0000 | ORAL_TABLET | Freq: Every day | ORAL | 1 refills | Status: DC

## 2017-05-16 ENCOUNTER — Ambulatory Visit (INDEPENDENT_AMBULATORY_CARE_PROVIDER_SITE_OTHER): Payer: 59 | Admitting: Family Medicine

## 2017-05-16 ENCOUNTER — Encounter: Payer: Self-pay | Admitting: Family Medicine

## 2017-05-16 VITALS — BP 120/78 | HR 90 | Temp 98.0°F | Resp 16 | Ht 64.5 in | Wt 204.0 lb

## 2017-05-16 DIAGNOSIS — I10 Essential (primary) hypertension: Secondary | ICD-10-CM

## 2017-05-16 DIAGNOSIS — E6609 Other obesity due to excess calories: Secondary | ICD-10-CM | POA: Diagnosis not present

## 2017-05-16 DIAGNOSIS — Z6834 Body mass index (BMI) 34.0-34.9, adult: Secondary | ICD-10-CM | POA: Diagnosis not present

## 2017-05-16 NOTE — Assessment & Plan Note (Signed)
Well controlled, check renal function No change in dose

## 2017-05-16 NOTE — Progress Notes (Signed)
   Subjective:    Patient ID: Alexandria [name], female    DOB: 11-Mar-1981, 36 y.o.   MRN: [id number]  Patient presents for Medication Management (is not fasting)  Pt here to f/u medications   HTN- taking maxzide as prescribed, no side effects, due for repeat bmet, last Cr. 1.24, 6 months ago   She is still followed by nOB/GYN has also seen neurology for her migraines, on Zonegram, they are worst during her periods   Followed by endocrinology for her hypothyroidism/ history of papillary thyroid cancer Dr. Cruzita Lederer   Review Of Systems:  GEN- denies fatigue, fever, weight loss,weakness, recent illness HEENT- denies eye drainage, change in vision, nasal discharge, CVS- denies chest pain, palpitations RESP- denies SOB, cough, wheeze ABD- denies N/V, change in stools, abd pain GU- denies dysuria, hematuria, dribbling, incontinence MSK- denies joint pain, muscle aches, injury Neuro- denies headache, dizziness, syncope, seizure activity       Objective:    BP 120/78   Pulse 90   Temp 98 F (36.7 C) (Oral)   Resp 16   Ht 5' 4.5" (1.638 m)   Wt 204 lb (92.5 kg)   SpO2 98%   BMI 34.48 kg/m  GEN- NAD, alert and oriented x3 HEENT- PERRL, EOMI, non injected sclera, pink conjunctiva, MMM, oropharynx clear CVS- RRR, no murmur RESP-CTAB EXT- No edema Pulses- Radial, DP- 2+        Assessment & Plan:      Problem List Items Addressed This Visit    Obesity   Hypertension - Primary    Well controlled, check renal function No change in dose       Relevant Orders   Basic metabolic panel      Note: This dictation was prepared with Dragon dictation along with smaller phrase technology. Any transcriptional errors that result from this process are unintentional.

## 2017-05-16 NOTE — Patient Instructions (Signed)
F/U 6 months for Physical  

## 2017-05-17 LAB — BASIC METABOLIC PANEL
BUN: 15 mg/dL (ref 7–25)
CO2: 25 mmol/L (ref 20–31)
Calcium: 9 mg/dL (ref 8.6–10.2)
Chloride: 105 mmol/L (ref 98–110)
Creat: 1.25 mg/dL — ABNORMAL HIGH (ref 0.50–1.10)
Glucose, Bld: 58 mg/dL — ABNORMAL LOW (ref 70–99)
Potassium: 3.5 mmol/L (ref 3.5–5.3)
Sodium: 141 mmol/L (ref 135–146)

## 2017-05-22 DIAGNOSIS — G43719 Chronic migraine without aura, intractable, without status migrainosus: Secondary | ICD-10-CM | POA: Diagnosis not present

## 2017-05-22 DIAGNOSIS — G43839 Menstrual migraine, intractable, without status migrainosus: Secondary | ICD-10-CM | POA: Diagnosis not present

## 2017-07-31 ENCOUNTER — Ambulatory Visit: Payer: 59 | Admitting: Internal Medicine

## 2017-08-17 ENCOUNTER — Ambulatory Visit (INDEPENDENT_AMBULATORY_CARE_PROVIDER_SITE_OTHER): Payer: 59 | Admitting: Internal Medicine

## 2017-08-17 ENCOUNTER — Encounter: Payer: Self-pay | Admitting: Internal Medicine

## 2017-08-17 VITALS — BP 122/82 | HR 76 | Wt 206.0 lb

## 2017-08-17 DIAGNOSIS — C73 Malignant neoplasm of thyroid gland: Secondary | ICD-10-CM

## 2017-08-17 DIAGNOSIS — E890 Postprocedural hypothyroidism: Secondary | ICD-10-CM | POA: Diagnosis not present

## 2017-08-17 MED ORDER — SYNTHROID 112 MCG PO TABS: 112.0000 ug | ORAL_TABLET | Freq: Every day | ORAL | 1 refills | Status: DC

## 2017-08-17 NOTE — Progress Notes (Addendum)
Patient ID: Alexandria [name], female   DOB: [birth year], 36 y.o.   MRN: [id number]    HPI  Alexandria [name] is a 36 y.o.-year-old female, initially referred by her PCP, Dr. Buelah Manis, for management of thyroid cancer and postsurgical hypothyroidism. Last visit 6 mo ago.  Since last visit, she started weight loss supplements: - B complex drops (120 mcg Biotin) - to help with energy - Resolution drops - ctns Thyroidinum - animal thyroid extract (!!!)  - started ~ 1 week ago Last doses: this am.  Pt. has been dx with thyroid cancer in 11/2006. I reviewed the notes from [facility] (Dr. Clarita Leber):  Patient was found to have a right thyroid nodule in 11/2016. 12/2006: Thyroidectomy with central lymph node dissection >> 2 foci of PTC: 4.5 cm and 1.5 cm in the right lobe with 11 lymph nodes positive for metastasis 01/2007: Tg 483, ATA positive 01/2007: RAI treatment 158 mCi 01/2007: Posttreatment whole-body scan with uptake only in the thyroid bed 02/2007: Another whole-body scan: Negative for metastasis 03/2007: Tg 48 (? If stimulated), ATA negative 05/2007: Tg 36 (? If stimulated), ATA negative 02/2008: Tg 128 (? If stimulated), ATA negative 03/2008: FNA of lymph node in the right neck: Positive for metastasis 05/2008: dissection of levels 2, 3, 4 lymph nodes: 12 out of 26 positive, largest being 2.1 cm with extracapsular extension 07/2008: Whole-body scan: Negative for metastasis 07/2008: RAI treatment 209 mCi (because of increasing thyroglobulin) 07/2008: Posttreatment whole-body scan negative for metastasis 03/2009: Whole-body scan: negative for metastases 08/2009: FNA of lymph nodes in the right and left neck: Negative  03/2010: FNA of lymph node in the right neck: Negative 06/2010: Stimulated Tg 4.5, ATA negative 12/2010: Neck ultrasound: Small, subcentimeter lymph nodes, not worrisome 01/2012: Neck ultrasound: Small, subcentimeter lymph nodes, not worrisome 12/2012: Neck ultrasound:  Small, subcentimeter lymph nodes, not worrisome 11/24/2015: Lymph node ultrasound mapping:  R3 lymph node with slightly thickened cortex R2 lymph node with slight decrease in size, with echogenic focus (may be calcification - needs attention L7 lymph node enlarged, with oval contour, may be slightly smaller since 2013 - needs attention  11/24/2015: FNA of lymph node in the right neck (R3): Negative  Also, see below:   Latest Tg + ATA: Component     Latest Ref Rng & Units 01/30/2017  Thyroglobulin Antibody     0.0 - 0.9 IU/mL <1.0  Thyroglobulin by IMA     1.5 - 38.5 ng/mL 1.4 (L)   Dr. Frederik Pear advise her not to have any more Thyrogen-stimulated thyroglobulin levels or whole-body scans to avoid any stimulation of tumor growth. She also suggested that the patient may need PET/CT if the thyroglobulin increases.  Pt denies: - feeling nodules in neck - hoarseness - dysphagia - choking - SOB with lying down  For her postsurgical hypothyroidism, she is on Synthroid DAW 175 >> ... >> 112 mcg, taken: - in am - fasting - at least 30 min from b'fast - + Ca (Tums - at night) - no Fe,  PPIs - not on Biotin - + MVI later in the day - missed LT4 2x in last months  She was maintained with suppressed TSH levels after her surgery. Since then, we have tried to decrease the levothyroxine dose, with persistent low TSH levels.  I reviewed pt's thyroid tests: Lab Results  Component Value Date   TSH 0.04 (L) 05/11/2017   TSH 0.01 (L) 03/15/2017   TSH 0.01 (L) 01/30/2017  TSH 0.01 (L) 10/11/2016   TSH <0.008 (L) 04/05/2015   TSH 0.040 (L) 12/01/2014   TSH 0.010 (L) 02/25/2014   FREET4 1.52 05/11/2017   FREET4 1.42 03/15/2017   FREET4 1.64 (H) 01/30/2017   FREET4 1.6 10/11/2016   FREET4 1.69 04/05/2015   FREET4 1.55 12/01/2014   FREET4 1.63 02/25/2014   Pt denies: - feeling nodules in neck - hoarseness - dysphagia - choking - SOB with lying down  On Mirena IUD. Has been on  NuvaRing, OCPs. She has hormonal migraines. H/o a miscarriage. She is not interested in a future pregnancy.  She has no FH of thyroid disorders. No FH of thyroid cancer. No h/o radiation tx to head or neck.  No seaweed or kelp. No recent contrast studies. No herbal supplements. No Biotin use. No recent steroids use.    ROS: Constitutional: no weight gain/no weight loss, no fatigue, no subjective hyperthermia, no subjective hypothermia Eyes: no blurry vision, no xerophthalmia ENT: no sore throat, no nodules palpated in throat, no dysphagia, no odynophagia, no hoarseness Cardiovascular: no CP/no SOB/no palpitations/no leg swelling Respiratory: no cough/no SOB/no wheezing Gastrointestinal: no N/no V/no D/no C/no acid reflux Musculoskeletal: no muscle aches/no joint aches Skin: no rashes, no hair loss Neurological: no tremors/no numbness/no tingling/no dizziness, + HA  I reviewed pt's medications, allergies, PMH, social hx, family hx, and changes were documented in the history of present illness. Otherwise, unchanged from my initial visit note.   Past Medical History:  Diagnosis Date  . Allergy   . Hypertension   . Migraines    Neurology- Dr. Vela Prose  . Thyroid cancer [facility])    Past Surgical History:  Procedure Laterality Date  . GALLBLADDER SURGERY    . left leg surgery    . parathyroid gland    . thyroid removed     Social History   Social History  . Marital status: Married    Spouse name: N/A  . Number of children: 2   Occupational History  . mental health counselor   Social History Main Topics  . Smoking status: Never Smoker  . Smokeless tobacco: Never Used  . Alcohol use No  . Drug use: No  . Sexual activity: Yes    Birth control/ protection: IUD   Current Outpatient Prescriptions on File Prior to Visit  Medication Sig Dispense Refill  . levonorgestrel (MIRENA) 20 MCG/24HR IUD 1 each by Intrauterine route once.    . Multiple Vitamins-Minerals (MULTIVITAMIN)  LIQD Take by mouth.    . SYNTHROID 112 MCG tablet Take 1 tablet (112 mcg total) by mouth daily before breakfast. 90 tablet 1  . triamterene-hydrochlorothiazide (MAXZIDE-25) 37.5-25 MG tablet Take 1 tablet by mouth daily. 90 tablet 1  . zonisamide (ZONEGRAN) 100 MG capsule Take 100 mg by mouth as needed (headache).     No current facility-administered medications on file prior to visit.    Allergies  Allergen Reactions  . Penicillins Other (See Comments)    Has not had since child, thinks she had reaction to it then   Family History  Problem Relation Age of Onset  . Hypertension Mother   . Diabetes Mother   . Hypertension Paternal Aunt   . Hypertension Paternal Uncle   . Hypertension Maternal Grandmother   . Cancer Maternal Grandfather   . Diabetes Maternal Grandfather   . Parkinsonism Maternal Grandfather   . Alzheimer's disease Maternal Grandfather   . Liver disease Sister    PE: BP 122/82 (BP Location: Left  Arm, Patient Position: Sitting)   Pulse 76   Wt 206 lb (93.4 kg)   SpO2 98%   BMI 34.81 kg/m  Wt Readings from Last 3 Encounters:  08/17/17 206 lb (93.4 kg)  05/16/17 204 lb (92.5 kg)  02/26/17 204 lb (92.5 kg)   Constitutional: overweight, in NAD Eyes: PERRLA, EOMI, no exophthalmos ENT: moist mucous membranes, no neck masses palpated, thyroidectomy scar visible, but healed,  no cervical lymphadenopathy Cardiovascular: RRR, No MRG Respiratory: CTA B Gastrointestinal: abdomen soft, NT, ND, BS+ Musculoskeletal: no deformities, strength intact in all 4 Skin: moist, warm, no rashes Neurological: no tremor with outstretched hands, DTR normal in all 4   ASSESSMENT: 1. Thyroid cancer - see HPI  2. Postsurgical Hypothyroidism  PLAN:  1. Thyroid cancer - papillary - I reviewed the pathology: she is stage 1 TNM due to age (path: T3N1M0) - she had to have 2x RAI txs in the past - has had persistently elevated Tg and MULTIPLE imaging tests and Bx'ed of LNs >>  benign - we will check Tg + ATA at the next lab draw in 1 mo to follow trend - I will see the patient back in 6 mo  2. Patient with h/o total thyroidectomy, now with iatrogenic hypothyroidism - latest thyroid labs reviewed with pt >> TSH still low - dose of LT4 decreased afterwards. - she continues on LT4 DAW 112 mcg daily, however, she recently added a weight loss supplement that contains a thyroid extract. I advised her to stop this and we will recheck her thyroid tests 1 month after she stops. - we discussed about taking the thyroid hormone every day, with water, >30 minutes before breakfast, separated by >4 hours from acid reflux medications, calcium, iron, multivitamins. Pt. is taking it correctly - will check thyroid tests in 1 month: TSH and fT4  Component     Latest Ref Rng & Units 09/28/2017  Thyroglobulin     ng/mL 2.0 (L)  TSH     0.35 - 4.50 uIU/mL 0.32 (L)  T4,Free(Direct)     0.60 - 1.60 ng/dL 1.20  Thyroglobulin Ab     < or = 1 IU/mL <1   TSH slightly low, but will continue current LT4 dose b/c higher Tg. She has had fluctuating Tg in the past. I will repeat it at next visit to check trend. May need PET CT if continues to increase. ATA negative.  Philemon Kingdom, MD PhD [facility] End[facility]

## 2017-08-17 NOTE — Patient Instructions (Signed)
Please continue Synthroid 112 mcg daily.  Take the thyroid hormone every day, with water, at least 30 minutes before breakfast, separated by at least 4 hours from: - acid reflux medications - calcium - iron - multivitamins  Stop Resolution Drops!  You can continue B complex, but stop this in the day of next lab draw  - come back for this in 5 weeks.

## 2017-09-28 ENCOUNTER — Other Ambulatory Visit (INDEPENDENT_AMBULATORY_CARE_PROVIDER_SITE_OTHER): Payer: 59

## 2017-09-28 DIAGNOSIS — E890 Postprocedural hypothyroidism: Secondary | ICD-10-CM | POA: Diagnosis not present

## 2017-09-28 DIAGNOSIS — C73 Malignant neoplasm of thyroid gland: Secondary | ICD-10-CM

## 2017-09-28 LAB — TSH: TSH: 0.32 u[IU]/mL — ABNORMAL LOW (ref 0.35–4.50)

## 2017-09-28 LAB — T4, FREE: Free T4: 1.2 ng/dL (ref 0.60–1.60)

## 2017-10-01 LAB — THYROGLOBULIN ANTIBODY: Thyroglobulin Ab: <1 IU/mL (ref <=1)

## 2017-10-01 LAB — THYROGLOBULIN LEVEL: Thyroglobulin: 2 ng/mL — ABNORMAL LOW

## 2017-10-02 ENCOUNTER — Telehealth: Payer: Self-pay

## 2017-10-02 NOTE — Telephone Encounter (Signed)
Called patient and gave lab results. Patient had no questions or concerns.  

## 2017-10-03 ENCOUNTER — Other Ambulatory Visit: Payer: Self-pay

## 2017-10-03 MED ORDER — SYNTHROID 112 MCG PO TABS: 112.0000 ug | ORAL_TABLET | Freq: Every day | ORAL | 1 refills | Status: DC

## 2017-10-26 ENCOUNTER — Ambulatory Visit: Payer: 59 | Admitting: Family Medicine

## 2017-10-26 DIAGNOSIS — G43909 Migraine, unspecified, not intractable, without status migrainosus: Secondary | ICD-10-CM | POA: Diagnosis not present

## 2017-10-30 ENCOUNTER — Encounter: Payer: Self-pay | Admitting: Family Medicine

## 2017-10-30 ENCOUNTER — Ambulatory Visit: Payer: 59 | Admitting: Family Medicine

## 2017-10-30 VITALS — BP 128/82 | HR 70 | Temp 98.5°F | Resp 16 | Ht 64.5 in | Wt 201.0 lb

## 2017-10-30 DIAGNOSIS — K5900 Constipation, unspecified: Secondary | ICD-10-CM | POA: Diagnosis not present

## 2017-10-30 DIAGNOSIS — R002 Palpitations: Secondary | ICD-10-CM

## 2017-10-30 DIAGNOSIS — K219 Gastro-esophageal reflux disease without esophagitis: Secondary | ICD-10-CM | POA: Diagnosis not present

## 2017-10-30 DIAGNOSIS — F3281 Premenstrual dysphoric disorder: Secondary | ICD-10-CM | POA: Diagnosis not present

## 2017-10-30 LAB — CBC WITH DIFFERENTIAL/PLATELET
Basophils Absolute: 31 cells/uL (ref 0–200)
Basophils Relative: 0.5 %
Eosinophils Absolute: 31 cells/uL (ref 15–500)
Eosinophils Relative: 0.5 %
HCT: 40.5 % (ref 35.0–45.0)
Hemoglobin: 13.5 g/dL (ref 11.7–15.5)
Lymphs Abs: 1787 cells/uL (ref 850–3900)
MCH: 28.2 pg (ref 27.0–33.0)
MCHC: 33.3 g/dL (ref 32.0–36.0)
MCV: 84.7 fL (ref 80.0–100.0)
MPV: 10.4 fL (ref 7.5–12.5)
Monocytes Relative: 7.6 %
Neutro Abs: 3788 cells/uL (ref 1500–7800)
Neutrophils Relative %: 62.1 %
Platelets: 353 10*3/uL (ref 140–400)
RBC: 4.78 10*6/uL (ref 3.80–5.10)
RDW: 12 % (ref 11.0–15.0)
Total Lymphocyte: 29.3 %
WBC mixed population: 464 cells/uL (ref 200–950)
WBC: 6.1 10*3/uL (ref 3.8–10.8)

## 2017-10-30 LAB — COMPREHENSIVE METABOLIC PANEL
AG Ratio: 1.3 (calc) (ref 1.0–2.5)
ALT: 15 U/L (ref 6–29)
AST: 17 U/L (ref 10–30)
Albumin: 4.1 g/dL (ref 3.6–5.1)
Alkaline phosphatase (APISO): 78 U/L (ref 33–115)
BUN/Creatinine Ratio: 10 (calc) (ref 6–22)
BUN: 13 mg/dL (ref 7–25)
CO2: 29 mmol/L (ref 20–32)
Calcium: 9.4 mg/dL (ref 8.6–10.2)
Chloride: 102 mmol/L (ref 98–110)
Creat: 1.28 mg/dL — ABNORMAL HIGH (ref 0.50–1.10)
Globulin: 3.1 g/dL (calc) (ref 1.9–3.7)
Glucose, Bld: 103 mg/dL — ABNORMAL HIGH (ref 65–99)
Potassium: 3.7 mmol/L (ref 3.5–5.3)
Sodium: 138 mmol/L (ref 135–146)
Total Bilirubin: 0.4 mg/dL (ref 0.2–1.2)
Total Protein: 7.2 g/dL (ref 6.1–8.1)

## 2017-10-30 LAB — TROPONIN I: Troponin I: <0.01 ng/mL (ref <=0.0)

## 2017-10-30 MED ORDER — OMEPRAZOLE 40 MG PO CPDR: 40.0000 mg | DELAYED_RELEASE_CAPSULE | Freq: Every day | ORAL | 3 refills | Status: DC

## 2017-10-30 MED ORDER — LINACLOTIDE 145 MCG PO CAPS: 145.0000 ug | ORAL_CAPSULE | Freq: Every day | ORAL | 0 refills | Status: DC

## 2017-10-30 NOTE — Patient Instructions (Addendum)
Take the omeprazole Take linzess 1 capsule daily  I will call with lab results for heart  F/U pending results  GIVE NOTE FOR WORK FOR TODAY

## 2017-10-30 NOTE — Assessment & Plan Note (Signed)
She is currently on her menstrual cycle which is contributing to some mood changes.  I think that she would benefit from being on an SSRI.  She does not want to start anything at this time in the setting of her GI upset.  I think that is reasonable.  After research I think that she would benefit from a low-dose of Paxil.  With regards to the atypical chest pain palpitations I think that this is reflux related will start her on omeprazole 40 mg once a day.  I do not see any signs of infection or heart attack.  I did send stat labs which came back unremarkable including troponin.  For the constipation she is concerned that she has a blockage because of severe gas and sometimes unable to get her bowel movements out.  I am going obtain a KUB.  The meantime I recommend that she start Linzess 145 mg daily which I gave her samples of.

## 2017-10-30 NOTE — Progress Notes (Signed)
Subjective:    Patient ID: Alexandria [name], female    DOB: 1981/01/27, 36 y.o.   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, helping with her mother and her family    Reflux symptonms forpast weeks  indgestion, burrping, gassy, constipated  Burning sensation in chest after eating Worse at night after laying down, would wake up in middle of night with burning sensation  Was taking TUMS medication Unable to eat pizza sauce/ BBQ/spicey foods Using teas to help bowels move, bowels are hard and painful  She has also had some intermittent palpitations    S/P Cholecystectomy    Review Of Systems:  GEN- denies fatigue, fever, weight loss,weakness, recent illness HEENT- denies eye drainage, change in vision, nasal discharge, CVS- denies chest pain, +palpitations RESP- denies SOB, cough, wheeze ABD- denies N/V, change in stools, +abd pain GU- denies dysuria, hematuria, dribbling, incontinence MSK- denies joint pain, muscle aches, injury Neuro- denies headache, dizziness, syncope, seizure activity       Objective:    BP 128/82   Pulse 70   Temp 98.5 F (36.9 C)   Resp 16   Ht 5' 4.5" (1.638 m)   Wt 201 lb (91.2 kg)   SpO2 96%   BMI 33.97 kg/m  GEN- NAD, alert and oriented x3 HEENT- PERRL, EOMI, non injected sclera, pink conjunctiva, MMM, oropharynx clear Neck- Supple, no lad CVS- RRR, no murmur RESP-CTAB ABD-NABS,soft,MILD ttp EPIGASTRIC REGION, NO REBOUND, NO GUARDING  Psych- depressed affect at times, no SI, well groomed  EXT- No edema Pulses- Radial, DP- 2+  EKG- NSR, flat t waves, mild wide QRS AVL       Assessment & Plan:      Problem List Items Addressed This Visit      Unprioritized   PMDD  (premenstrual dysphoric disorder)    She is currently on her menstrual cycle which is contributing to some mood changes.  I think that she would benefit from being on an SSRI.  She does not want to start anything at this time in the setting of her GI upset.  I think that is reasonable.  After research I think that she would benefit from a low-dose of Paxil.  With regards to the atypical chest pain palpitations I think that this is reflux related will start her on omeprazole 40 mg once a day.  I do not see any signs of infection or heart attack.  I did send stat labs which came back unremarkable including troponin.  For the constipation she is concerned that she has a blockage because of severe gas and sometimes unable to get her bowel movements out.  I am going obtain a KUB.  The meantime I recommend that she start Linzess 145 mg daily which I gave her samples of.       Other Visit Diagnoses    Palpitations    -  Primary   Relevant Orders   EKG 12-Lead (Completed)   CBC with Differential/Platelet (Completed)   Comprehensive metabolic panel (Completed)   Troponin I (Completed)   Gastroesophageal reflux disease without esophagitis       Relevant Medications   omeprazole (PRILOSEC) 40 MG capsule   linaclotide (LINZESS) 145 MCG CAPS  capsule   Constipation, unspecified constipation type       Relevant Orders   DG Abd 1 View      Note: This dictation was prepared with Dragon dictation along with smaller phrase technology. Any transcriptional errors that result from this process are unintentional.

## 2017-10-31 ENCOUNTER — Ambulatory Visit
Admission: RE | Admit: 2017-10-31 | Discharge: 2017-10-31 | Disposition: A | Discharge status: Home / Self Care | Payer: 59 | Source: Ambulatory Visit | Attending: Family Medicine | Admitting: Family Medicine

## 2017-10-31 DIAGNOSIS — K5900 Constipation, unspecified: Secondary | ICD-10-CM

## 2017-10-31 DIAGNOSIS — R1032 Left lower quadrant pain: Secondary | ICD-10-CM | POA: Diagnosis not present

## 2017-11-06 ENCOUNTER — Other Ambulatory Visit: Payer: Self-pay | Admitting: *Deleted

## 2017-11-06 ENCOUNTER — Telehealth: Payer: Self-pay | Admitting: *Deleted

## 2017-11-06 DIAGNOSIS — K5900 Constipation, unspecified: Secondary | ICD-10-CM

## 2017-11-06 DIAGNOSIS — K219 Gastro-esophageal reflux disease without esophagitis: Secondary | ICD-10-CM

## 2017-11-06 NOTE — Telephone Encounter (Signed)
Call placed to patient and patient made aware.   Referral orders placed.  

## 2017-11-06 NOTE — Telephone Encounter (Signed)
Send to GI GERD/Constipation Start taking OTC Probiotics- Align or Restora are good brands, thi should help the bloating, she may have more IBS

## 2017-11-06 NOTE — Telephone Encounter (Signed)
Received call from patient.   Reports that she continues to have abdominal pain, but now mostly in lower abdominal area. States that she has bloating and gas, but pain usually occurs after she eats.   MD please advise.

## 2017-11-08 ENCOUNTER — Ambulatory Visit (INDEPENDENT_AMBULATORY_CARE_PROVIDER_SITE_OTHER): Payer: 59 | Admitting: Gastroenterology

## 2017-11-08 ENCOUNTER — Encounter: Payer: Self-pay | Admitting: Gastroenterology

## 2017-11-08 VITALS — BP 110/72 | HR 68 | Ht 63.75 in | Wt 197.5 lb

## 2017-11-08 DIAGNOSIS — R194 Change in bowel habit: Secondary | ICD-10-CM | POA: Diagnosis not present

## 2017-11-08 DIAGNOSIS — R1084 Generalized abdominal pain: Secondary | ICD-10-CM | POA: Diagnosis not present

## 2017-11-08 DIAGNOSIS — K5904 Chronic idiopathic constipation: Secondary | ICD-10-CM

## 2017-11-08 MED ORDER — PLECANATIDE 3 MG PO TABS
1.0000 | ORAL_TABLET | Freq: Every day | ORAL | 11 refills | Status: DC
Start: 2017-11-08 — End: 2018-02-25

## 2017-11-08 MED ORDER — NA SULFATE-K SULFATE-MG SULF 17.5-3.13-1.6 GM/177ML PO SOLN: 1.0000 | Freq: Once | ORAL | 0 refills | Status: AC

## 2017-11-08 NOTE — Patient Instructions (Signed)
Dr Fuller Plan recommends that you complete a bowel purge (to clean out your bowels) using Plenvu: Remain on a clear liquid diet the day of your bowel purge.  Follow instructions on Step 1-3 the morning of your purge and repeat 4 hours later steps 1-3. Please make sure on Dose 2 that you add pouch A and B to to container with water to fill line.   After your bowels are clear, the next day start with over the counter Miralax mixing 17 grams in 8 oz of water three times a day.   Once your bowels have started to move and have become more regular, start Trulance samples one tablet by mouth once daily in the morning. A prescription for Trulance has also been sent to your pharmacy after you have finished samples.  Normal BMI (Body Mass Index- based on height and weight) is between 19 and 25. Your BMI today is Body mass index is 34.17 kg/m. Marland Kitchen Please consider follow up  regarding your BMI with your Primary Care Provider.  Thank you for choosing me and [facility].  Pricilla Riffle. Dagoberto Ligas., MD., Marval Regal

## 2017-11-08 NOTE — Progress Notes (Signed)
History of Present Illness: This is a 36 year old female referred by Alycia Rossetti, MD for the evaluation of constipation and abdominal pain.  She relates the sudden onset of constipation about 1 month ago associated with generalized abdominal pain and bloating.  She notes straining with bowel movements and hard, pellet-like stools.  She tried Linzess with led to significant diarrhea so it was discontinued.  She has tried herbal teas without adequate relief of symptoms.  She states for a few days she had heartburn mainly related to drinking ginger products - she discontinued this and her heartburn abated.  No prior gastrointestinal problems. Denies weight loss, diarrhea, change in stool caliber, melena, hematochezia, nausea, vomiting, dysphagia, reflux symptoms, chest pain.   Allergies  Allergen Reactions  . Penicillins Other (See Comments)    Has not had since child, thinks she had reaction to it then   Outpatient Medications Prior to Visit  Medication Sig Dispense Refill  . ibuprofen (ADVIL,MOTRIN) 600 MG tablet Take 600 mg by mouth every 6 (six) hours as needed.    Marland Kitchen levonorgestrel (MIRENA) 20 MCG/24HR IUD 1 each by Intrauterine route once.    . linaclotide (LINZESS) 145 MCG CAPS capsule Take 1 capsule (145 mcg total) daily before breakfast by mouth. 30 capsule 0  . Multiple Vitamins-Minerals (MULTIVITAMIN) LIQD Take by mouth.    Marland Kitchen omeprazole (PRILOSEC) 40 MG capsule Take 1 capsule (40 mg total) daily by mouth. 30 capsule 3  . SYNTHROID 112 MCG tablet Take 1 tablet (112 mcg total) by mouth daily before breakfast. 90 tablet 1  . triamterene-hydrochlorothiazide (MAXZIDE-25) 37.5-25 MG tablet Take 1 tablet by mouth daily. 90 tablet 1  . zonisamide (ZONEGRAN) 100 MG capsule Take 100 mg by mouth as needed (headache).     No facility-administered medications prior to visit.    Past Medical History:  Diagnosis Date  . Allergy   . Hypertension   . Migraines    Neurology- Dr. [name] Alar  .  Thyroid cancer [facility])    Past Surgical History:  Procedure Laterality Date  . GALLBLADDER SURGERY    . left leg surgery    . parathyroid gland    . thyroid removed     Social History   Socioeconomic History  . Marital status: Married    Spouse name: None  . Number of children: None  . Years of education: None  . Highest education level: None  Social Needs  . Financial resource strain: None  . Food insecurity - worry: None  . Food insecurity - inability: None  . Transportation needs - medical: None  . Transportation needs - non-medical: None  Occupational History  . None  Tobacco Use  . Smoking status: Never Smoker  . Smokeless tobacco: Never Used  Substance and Sexual Activity  . Alcohol use: No  . Drug use: No  . Sexual activity: Yes    Birth control/protection: IUD  Other Topics Concern  . None  Social History Narrative  . None   Family History  Problem Relation Age of Onset  . Hypertension Mother   . Diabetes Mother   . Hypertension Paternal Aunt   . Hypertension Paternal Uncle   . Hypertension Maternal Grandmother   . Cancer Maternal Grandfather   . Diabetes Maternal Grandfather   . Parkinsonism Maternal Grandfather   . Alzheimer's disease Maternal Grandfather   . Liver disease Sister       Review of Systems: Pertinent positive and negative review of systems  were noted in the above HPI section. All other review of systems were otherwise negative.    Physical Exam: General: Well developed, well nourished, no acute distress Head: Normocephalic and atraumatic Eyes:  sclerae anicteric, EOMI Ears: Normal auditory acuity Mouth: No deformity or lesions Neck: Supple, no masses or thyromegaly Lungs: Clear throughout to auscultation Heart: Regular rate and rhythm; no murmurs, rubs or bruits Abdomen: Soft, generalized tenderness and non distended. No masses, hepatosplenomegaly or hernias noted. Normal Bowel sounds Rectal: deferred to  colonoscopy Musculoskeletal: Symmetrical with no gross deformities  Skin: No lesions on visible extremities Pulses:  Normal pulses noted Extremities: No clubbing, cyanosis, edema or deformities noted Neurological: Alert oriented x 4, grossly nonfocal Cervical Nodes:  No significant cervical adenopathy Inguinal Nodes: No significant inguinal adenopathy Psychological:  Alert and cooperative. Normal mood and affect   Assessment and Recommendations:  1.  Change in bowel habits. Presumed CIC vs IBS-C. Failed Linzess. Trulance 3 mg qd. Miralax tid for 2-3 days and then begin Trulance. Call if symptoms not improving. Schedule colonoscopy. The risks (including bleeding, perforation, infection, missed lesions, medication reactions and possible hospitalization or surgery if complications occur), benefits, and alternatives to colonoscopy with possible biopsy and possible polypectomy were discussed with the patient and they consent to proceed.   2. Heartburn, diet related, resolved.    cc: Alycia Rossetti, MD [address] [place], [place] [address]

## 2017-11-12 ENCOUNTER — Other Ambulatory Visit: Payer: Self-pay | Admitting: Family Medicine

## 2017-11-20 ENCOUNTER — Other Ambulatory Visit: Payer: Self-pay

## 2017-11-20 ENCOUNTER — Ambulatory Visit (INDEPENDENT_AMBULATORY_CARE_PROVIDER_SITE_OTHER): Payer: 59 | Admitting: Family Medicine

## 2017-11-20 ENCOUNTER — Encounter: Payer: Self-pay | Admitting: Family Medicine

## 2017-11-20 VITALS — BP 128/74 | HR 70 | Temp 97.6°F | Resp 16 | Ht 64.0 in | Wt 204.0 lb

## 2017-11-20 DIAGNOSIS — E559 Vitamin D deficiency, unspecified: Secondary | ICD-10-CM

## 2017-11-20 DIAGNOSIS — Z0000 Encounter for general adult medical examination without abnormal findings: Secondary | ICD-10-CM | POA: Diagnosis not present

## 2017-11-20 DIAGNOSIS — R7303 Prediabetes: Secondary | ICD-10-CM

## 2017-11-20 DIAGNOSIS — I10 Essential (primary) hypertension: Secondary | ICD-10-CM

## 2017-11-20 DIAGNOSIS — Z6835 Body mass index (BMI) 35.0-35.9, adult: Secondary | ICD-10-CM

## 2017-11-20 DIAGNOSIS — F3281 Premenstrual dysphoric disorder: Secondary | ICD-10-CM | POA: Diagnosis not present

## 2017-11-20 DIAGNOSIS — N182 Chronic kidney disease, stage 2 (mild): Secondary | ICD-10-CM | POA: Diagnosis not present

## 2017-11-20 DIAGNOSIS — Z23 Encounter for immunization: Secondary | ICD-10-CM | POA: Diagnosis not present

## 2017-11-20 DIAGNOSIS — E66812 Obesity, class 2: Secondary | ICD-10-CM

## 2017-11-20 DIAGNOSIS — N2889 Other specified disorders of kidney and ureter: Secondary | ICD-10-CM

## 2017-11-20 NOTE — Patient Instructions (Addendum)
F/U 4 months  We will send labs via mychart Call your therapist  Flu shot given

## 2017-11-20 NOTE — Progress Notes (Signed)
Subjective:    Patient ID: Alexandria Alexandria [name], female    DOB: Nov 01, 1981, 36 y.o.   MRN: [id number]  Patient presents for CPE (is fasting)  Pt here for physical exam.  She has a GYN who does her Pap smears. Her last visit a few weeks ago she was referred to gastroenterology for ongoing abdominal pain she has IBS constipation she was given a cleanout and then she was supposed to start a new medication called Trulance. She has waited until after THanksgiving and plans to do the cleanout   Scheduled for Colonoscopy in January   Had ongoing stress for quite some time.  We discussed at her last visit possibly starting an SSRI but wanted to get over her abdominal GI upset.  Recommend that we start Paxil she has a lot of symptoms also surrounding her menstrual cycle including migraines.  She does have PMDD which was diagnosed by her therapist she has not seen therapist pat few months   Has GYN appt December  3rd , she is going to ask them about checking her for ovarian cancer as well due to  Her bloating history of thyroid cancer   She continues to follow with her endocrinologist for her hypothyroidism in the setting of history of thyroid cancer.  Recent labs also showed chronic mild renal insufficiency.  Creatinine is been about 1.2 for over a year.  Hypertension she is taking the Maxide as prescribed  history of borderline diabetes mellitus her last A1c was normal at 5.5% a year ago however she does continue to gain weight  Flu shot  Due     Review Of Systems:  GEN- denies fatigue, fever, weight loss,weakness, recent illness HEENT- denies eye drainage, change in vision, nasal discharge, CVS- denies chest pain, palpitations RESP- denies SOB, cough, wheeze ABD- denies N/V, +change in stools, abd pain GU- denies dysuria, hematuria, dribbling, incontinence MSK- denies joint pain, muscle aches, injury Neuro- denies headache, dizziness, syncope, seizure activity       Objective:    BP 128/74    Pulse 70   Temp 97.6 F (36.4 C) (Oral)   Resp 16   Ht 5\' 4"  (1.626 m)   Wt 204 lb (92.5 kg)   SpO2 97%   BMI 35.02 kg/m  GEN- NAD, alert and oriented x3 HEENT- PERRL, EOMI, non injected sclera, pink conjunctiva, MMM, oropharynx clear Neck- Supple, no thyromegaly CVS- RRR, no murmur RESP-CTAB ABD-NABS,soft,mild bloating, NT Psych- normal affect and mood  EXT- No edema Pulses- Radial, DP- 2+        Assessment & Plan:      Problem List Items Addressed This Visit      Unprioritized   Vitamin D deficiency   Relevant Orders   Vitamin D, 25-hydroxy   Prediabetes   Relevant Orders   Hemoglobin A1c   Obesity   Hypertension   PMDD (premenstrual dysphoric disorder)    Other Visit Diagnoses    Routine general medical examination at a health care facility    -  Primary   CPE done, flu shot given, fasting labs. She declines taking SSRI, will schedule with her therapist. RECOMMEND she take the cleanout and start IBS meds. F/U GYN for PAP smear and ovarian check. Though seems symptoms more related to her IBS C Blood pressure controlled, check renal function has chronic RI Discussed diet, after cleanout monitor foods causing more symptoms that can lead to weight gain as well     Relevant Orders  COMPLETE METABOLIC PANEL WITH GFR   Lipid panel   Chronic renal impairment, Alexandria [name] 2 (mild)       Relevant Orders   COMPLETE METABOLIC PANEL WITH GFR   Need for immunization against influenza       Relevant Orders   Flu Vaccine QUAD 36+ mos IM (Completed)      Note: This dictation was prepared with Dragon dictation along with smaller phrase technology. Any transcriptional errors that result from this process are unintentional.

## 2017-11-21 ENCOUNTER — Telehealth: Payer: Self-pay | Admitting: Gastroenterology

## 2017-11-21 LAB — COMPLETE METABOLIC PANEL WITH GFR
AG Ratio: 1.5 (calc) (ref 1.0–2.5)
ALT: 66 U/L — ABNORMAL HIGH (ref 6–29)
AST: 37 U/L — ABNORMAL HIGH (ref 10–30)
Albumin: 3.7 g/dL (ref 3.6–5.1)
Alkaline phosphatase (APISO): 80 U/L (ref 33–115)
BUN: 11 mg/dL (ref 7–25)
CO2: 26 mmol/L (ref 20–32)
Calcium: 8.8 mg/dL (ref 8.6–10.2)
Chloride: 104 mmol/L (ref 98–110)
Creat: 1.04 mg/dL (ref 0.50–1.10)
GFR, Est African American: 79 mL/min/{1.73_m2} (ref >=60)
GFR, Est Non African American: 68 mL/min/{1.73_m2} (ref >=60)
Globulin: 2.5 g/dL (calc) (ref 1.9–3.7)
Glucose, Bld: 92 mg/dL (ref 65–99)
Potassium: 3.6 mmol/L (ref 3.5–5.3)
Sodium: 138 mmol/L (ref 135–146)
Total Bilirubin: 0.3 mg/dL (ref 0.2–1.2)
Total Protein: 6.2 g/dL (ref 6.1–8.1)

## 2017-11-21 LAB — VITAMIN D 25 HYDROXY (VIT D DEFICIENCY, FRACTURES): Vit D, 25-Hydroxy: 26 ng/mL — ABNORMAL LOW (ref 30–100)

## 2017-11-21 LAB — LIPID PANEL
Cholesterol: 161 mg/dL (ref <200)
HDL: 57 mg/dL (ref >50)
LDL Cholesterol (Calc): 89 mg/dL (calc)
Non-HDL Cholesterol (Calc): 104 mg/dL (calc) (ref <130)
Total CHOL/HDL Ratio: 2.8 (calc) (ref <5.0)
Triglycerides: 61 mg/dL (ref <150)

## 2017-11-21 LAB — HEMOGLOBIN A1C
Hgb A1c MFr Bld: 5.6 % of total Hgb (ref <5.7)
Mean Plasma Glucose: 114 (calc)
eAG (mmol/L): 6.3 (calc)

## 2017-11-21 NOTE — Telephone Encounter (Signed)
Patient took the first half of the Plenvu .  She is advised to stop purge if the stool is clear.

## 2017-11-22 ENCOUNTER — Other Ambulatory Visit: Payer: Self-pay | Admitting: *Deleted

## 2017-11-22 DIAGNOSIS — R945 Abnormal results of liver function studies: Principal | ICD-10-CM

## 2017-11-22 DIAGNOSIS — R7989 Other specified abnormal findings of blood chemistry: Secondary | ICD-10-CM

## 2017-11-26 DIAGNOSIS — G43829 Menstrual migraine, not intractable, without status migrainosus: Secondary | ICD-10-CM | POA: Diagnosis not present

## 2017-11-26 DIAGNOSIS — R140 Abdominal distension (gaseous): Secondary | ICD-10-CM | POA: Diagnosis not present

## 2017-11-26 DIAGNOSIS — N920 Excessive and frequent menstruation with regular cycle: Secondary | ICD-10-CM | POA: Diagnosis not present

## 2017-11-29 ENCOUNTER — Other Ambulatory Visit: Payer: 59

## 2017-11-29 DIAGNOSIS — R7989 Other specified abnormal findings of blood chemistry: Secondary | ICD-10-CM

## 2017-11-29 DIAGNOSIS — R945 Abnormal results of liver function studies: Principal | ICD-10-CM

## 2017-11-30 LAB — HEPATIC FUNCTION PANEL
AG Ratio: 1.3 (calc) (ref 1.0–2.5)
ALT: 22 U/L (ref 6–29)
AST: 12 U/L (ref 10–30)
Albumin: 3.6 g/dL (ref 3.6–5.1)
Alkaline phosphatase (APISO): 74 U/L (ref 33–115)
Bilirubin, Direct: 0.1 mg/dL (ref 0.0–0.2)
Globulin: 2.8 g/dL (calc) (ref 1.9–3.7)
Indirect Bilirubin: 0.2 mg/dL (calc) (ref 0.2–1.2)
Total Bilirubin: 0.3 mg/dL (ref 0.2–1.2)
Total Protein: 6.4 g/dL (ref 6.1–8.1)

## 2017-11-30 LAB — LIPASE: Lipase: 13 U/L (ref 7–60)

## 2017-12-20 DIAGNOSIS — G43839 Menstrual migraine, intractable, without status migrainosus: Secondary | ICD-10-CM | POA: Diagnosis not present

## 2017-12-20 DIAGNOSIS — R945 Abnormal results of liver function studies: Secondary | ICD-10-CM | POA: Diagnosis not present

## 2017-12-20 DIAGNOSIS — G43719 Chronic migraine without aura, intractable, without status migrainosus: Secondary | ICD-10-CM | POA: Diagnosis not present

## 2017-12-20 DIAGNOSIS — R140 Abdominal distension (gaseous): Secondary | ICD-10-CM | POA: Diagnosis not present

## 2017-12-26 ENCOUNTER — Encounter: Payer: Self-pay | Admitting: Gastroenterology

## 2018-01-02 ENCOUNTER — Encounter: Payer: Self-pay | Admitting: Gastroenterology

## 2018-01-02 ENCOUNTER — Ambulatory Visit (AMBULATORY_SURGERY_CENTER): Payer: 59 | Admitting: Gastroenterology

## 2018-01-02 ENCOUNTER — Other Ambulatory Visit: Payer: Self-pay

## 2018-01-02 VITALS — BP 115/82 | HR 64 | Temp 98.0°F | Resp 19 | Ht 64.0 in | Wt 204.0 lb

## 2018-01-02 DIAGNOSIS — K5909 Other constipation: Secondary | ICD-10-CM

## 2018-01-02 DIAGNOSIS — R1084 Generalized abdominal pain: Secondary | ICD-10-CM | POA: Diagnosis present

## 2018-01-02 MED ORDER — SODIUM CHLORIDE 0.9 % IV SOLN: 500.0000 mL | Freq: Once | INTRAVENOUS | Status: DC

## 2018-01-02 NOTE — Progress Notes (Signed)
Verbal order given for omeprazole 40mg  daily with 11 refills.documented in error.

## 2018-01-02 NOTE — Progress Notes (Signed)
Report to PACU, RN, vss, BBS= Clear.  

## 2018-01-02 NOTE — Patient Instructions (Signed)
YOU HAD AN ENDOSCOPIC PROCEDURE TODAY AT THE [facility]:   Refer to the procedure report that was given to you for any specific questions about what was found during the examination.  If the procedure report does not answer your questions, please call your gastroenterologist to clarify.  If you requested that your care partner not be given the details of your procedure findings, then the procedure report has been included in a sealed envelope for you to review at your convenience later.  YOU SHOULD EXPECT: Some feelings of bloating in the abdomen. Passage of more gas than usual.  Walking can help get rid of the air that was put into your GI tract during the procedure and reduce the bloating. If you had a lower endoscopy (such as a colonoscopy or flexible sigmoidoscopy) you may notice spotting of blood in your stool or on the toilet paper. If you underwent a bowel prep for your procedure, you may not have a normal bowel movement for a few days.  Please Note:  You might notice some irritation and congestion in your nose or some drainage.  This is from the oxygen used during your procedure.  There is no need for concern and it should clear up in a day or so.  SYMPTOMS TO REPORT IMMEDIATELY:   Following lower endoscopy (colonoscopy or flexible sigmoidoscopy):  Excessive amounts of blood in the stool  Significant tenderness or worsening of abdominal pains  Swelling of the abdomen that is new, acute  Fever of 100F or higher    For urgent or emergent issues, a gastroenterologist can be reached at any hour by calling [phone].   DIET:  We do recommend a small meal at first, but then you may proceed to your regular diet.  Drink plenty of fluids but you should avoid alcoholic beverages for 24 hours.  ACTIVITY:  You should plan to take it easy for the rest of today and you should NOT DRIVE or use heavy machinery until tomorrow (because of the sedation medicines used during the test).     FOLLOW UP: Our staff will call the number listed on your records the next business day following your procedure to check on you and address any questions or concerns that you may have regarding the information given to you following your procedure. If we do not reach you, we will leave a message.  However, if you are feeling well and you are not experiencing any problems, there is no need to return our call.  We will assume that you have returned to your regular daily activities without incident.  If any biopsies were taken you will be contacted by phone or by letter within the next 1-3 weeks.  Please call us at [phone] if you have not heard about the biopsies in 3 weeks.    SIGNATURES/CONFIDENTIALITY: You and/or your care partner have signed paperwork which will be entered into your electronic medical record.  These signatures attest to the fact that that the information above on your After Visit Summary has been reviewed and is understood.  Full responsibility of the confidentiality of this discharge information lies with you and/or your care-partner.   Resume medications. Information given on diverticulosis. 

## 2018-01-02 NOTE — Op Note (Signed)
[facility] Patient Name: Alexandria [name] Procedure Date: 01/02/2018 11:23 AM MRN: [id number] Endoscopist: Ladene Artist , MD Age: 37 Referring MD:  Date of Birth: 1981/03/14 Gender: Female Account #: [id number] Procedure:                Colonoscopy Indications:              Generalized abdominal pain, Constipation Medicines:                Monitored Anesthesia Care Procedure:                Pre-Anesthesia Assessment:                           - Prior to the procedure, a History and Physical                            was performed, and patient medications and                            allergies were reviewed. The patient's tolerance of                            previous anesthesia was also reviewed. The risks                            and benefits of the procedure and the sedation                            options and risks were discussed with the patient.                            All questions were answered, and informed consent                            was obtained. Prior Anticoagulants: The patient has                            taken no previous anticoagulant or antiplatelet                            agents. ASA Grade Assessment: II - A patient with                            mild systemic disease. After reviewing the risks                            and benefits, the patient was deemed in                            satisfactory condition to undergo the procedure.                           After obtaining informed consent, the colonoscope  was passed under direct vision. Throughout the                            procedure, the patient's blood pressure, pulse, and                            oxygen saturations were monitored continuously. The                            Model PCF-H190DL [id number]) scope was introduced                            through the anus and advanced to the the cecum,                            identified by  appendiceal orifice and ileocecal                            valve. The ileocecal valve, appendiceal orifice,                            and rectum were photographed. The quality of the                            bowel preparation was good. The colonoscopy was                            performed without difficulty. The patient tolerated                            the procedure well. Scope In: 11:41:07 AM Scope Out: 11:51:00 AM Scope Withdrawal Time: 0 hours 8 minutes 28 seconds  Total Procedure Duration: 0 hours 9 minutes 53 seconds  Findings:                 The perianal and digital rectal examinations were                            normal.                           The exam was otherwise without abnormality on                            direct and retroflexion views.                           A few small-mouthed diverticula were found in the                            left colon. There was no evidence of diverticular                            bleeding. Complications:            No immediate complications. Estimated blood  loss:                            None. Estimated Blood Loss:     Estimated blood loss: none. Impression:               - The examination was otherwise normal on direct                            and retroflexion views.                           - Mild diverticulosis in the left colon. There was                            no evidence of diverticular bleeding.                           - No specimens collected. Recommendation:           - Repeat colonoscopy in 10 years for screening                            purposes.                           - Patient has a contact number available for                            emergencies. The signs and symptoms of potential                            delayed complications were discussed with the                            patient. Return to normal activities tomorrow.                            Written discharge instructions  were provided to the                            patient.                           - Resume previous diet.                           - Continue present medications. Ladene Artist, MD 01/02/2018 11:58:02 AM This report has been signed electronically.

## 2018-01-03 ENCOUNTER — Telehealth: Payer: Self-pay | Admitting: *Deleted

## 2018-01-03 NOTE — Telephone Encounter (Signed)
Unable to leave message for post procedure call back, will attempt to call back later this afternoon. SM

## 2018-01-03 NOTE — Telephone Encounter (Signed)
  Follow up Call-  Call back number 01/02/2018  Post procedure Call Back phone  # [phone]  Permission to leave phone message Yes  Some recent data might be hidden     Patient questions:  Do you have a fever, pain , or abdominal swelling? No. Pain Score  0 *  Have you tolerated food without any problems? Yes.    Have you been able to return to your normal activities? Yes.    Do you have any questions about your discharge instructions: Diet   No. Medications  No. Follow up visit  No.  Do you have questions or concerns about your Care? No.  Actions: * If pain score is 4 or above: No action needed, pain <4.

## 2018-01-03 NOTE — Telephone Encounter (Signed)
Pt returned call and said she is doing good other than some left sided pain

## 2018-01-30 DIAGNOSIS — N920 Excessive and frequent menstruation with regular cycle: Secondary | ICD-10-CM | POA: Diagnosis not present

## 2018-01-30 DIAGNOSIS — N83201 Unspecified ovarian cyst, right side: Secondary | ICD-10-CM | POA: Diagnosis not present

## 2018-01-30 DIAGNOSIS — R140 Abdominal distension (gaseous): Secondary | ICD-10-CM | POA: Diagnosis not present

## 2018-02-18 ENCOUNTER — Ambulatory Visit: Payer: 59 | Admitting: Internal Medicine

## 2018-02-25 ENCOUNTER — Ambulatory Visit (INDEPENDENT_AMBULATORY_CARE_PROVIDER_SITE_OTHER): Payer: 59 | Admitting: Internal Medicine

## 2018-02-25 ENCOUNTER — Encounter: Payer: Self-pay | Admitting: Internal Medicine

## 2018-02-25 VITALS — BP 122/74 | HR 77 | Temp 97.8°F | Ht 63.75 in | Wt 202.2 lb

## 2018-02-25 DIAGNOSIS — C73 Malignant neoplasm of thyroid gland: Secondary | ICD-10-CM | POA: Diagnosis not present

## 2018-02-25 DIAGNOSIS — E890 Postprocedural hypothyroidism: Secondary | ICD-10-CM | POA: Diagnosis not present

## 2018-02-25 LAB — TSH: TSH: 1.55 u[IU]/mL (ref 0.35–4.50)

## 2018-02-25 LAB — T4, FREE: Free T4: 1.12 ng/dL (ref 0.60–1.60)

## 2018-02-25 NOTE — Patient Instructions (Signed)
Please continue Synthroid 112 mcg daily.  Take the thyroid hormone every day, with water, at least 30 minutes before breakfast, separated by at least 4 hours from: - acid reflux medications - calcium - iron - multivitamins  Please stop at the lab.  Please come back for a f/u appt in 1 year.

## 2018-02-25 NOTE — Progress Notes (Signed)
Patient ID: Alexandria [name], female   DOB: [birth year], 37 y.o.   MRN: [id number]    HPI  Alexandria [name] is a 37 y.o.-year-old female, returning for f/u for metastatic thyroid cancer and postsurgical hypothyroidism. Last visit 6 months ago.  She has diverticulitis in 12/2017.  Reviewed cancer history: Pt. has been dx with thyroid cancer in 11/2006. I reviewed the notes from [facility] (Dr. Clarita Leber):  Patient was found to have a right thyroid nodule in 11/2016. 12/2006: Thyroidectomy with central lymph node dissection >> 2 foci of PTC: 4.5 cm and 1.5 cm in the right lobe with 11 lymph nodes positive for metastasis 01/2007: Tg 483, ATA positive 01/2007: RAI treatment 158 mCi 01/2007: Posttreatment whole-body scan with uptake only in the thyroid bed 02/2007: Another whole-body scan: Negative for metastasis 03/2007: Tg 48 (? If stimulated), ATA negative 05/2007: Tg 36 (? If stimulated), ATA negative 02/2008: Tg 128 (? If stimulated), ATA negative 03/2008: FNA of lymph node in the right neck: Positive for metastasis 05/2008: dissection of levels 2, 3, 4 lymph nodes: 12 out of 26 positive, largest being 2.1 cm with extracapsular extension 07/2008: Whole-body scan: Negative for metastasis 07/2008: RAI treatment 209 mCi (because of increasing thyroglobulin) 07/2008: Posttreatment whole-body scan negative for metastasis 03/2009: Whole-body scan: negative for metastases 08/2009: FNA of lymph nodes in the right and left neck: Negative  03/2010: FNA of lymph node in the right neck: Negative 06/2010: Stimulated Tg 4.5, ATA negative 12/2010: Neck ultrasound: Small, subcentimeter lymph nodes, not worrisome 01/2012: Neck ultrasound: Small, subcentimeter lymph nodes, not worrisome 12/2012: Neck ultrasound: Small, subcentimeter lymph nodes, not worrisome 11/24/2015: Lymph node ultrasound mapping:  R3 lymph node with slightly thickened cortex R2 lymph node with slight decrease in size, with  echogenic focus (may be calcification - needs attention L7 lymph node enlarged, with oval contour, may be slightly smaller since 2013 - needs attention  11/24/2015: FNA of lymph node in the right neck (R3): Negative  Also, see below:   Latest Tg was a Hopke higher, with undetectable ATA's: Lab Results  Component Value Date   THYROGLB 2.0 (L) 09/28/2017   THYROGLB 1.4 (L) 01/30/2017   THGAB <1 09/28/2017   THGAB <1.0 01/30/2017   Dr. Frederik Pear advise her not to have any more Thyrogen-stimulated thyroglobulin levels or whole-body scans to avoid any stimulation of tumor growth. She also suggested that the patient may need PET/CT if the thyroglobulin increases.  Postsurgical hypothyroidism:  Pt is on Synthroid 112 mcg daily, taken: - in am - fasting - at least 30 min from b'fast - stopped Ca (Tums) - was taking it at night - + MVI later in the day - no Fe, PPIs - not on Biotin - stopped 2 weeks ago) + supplements at lunchtime ([place], [place], Nitro, Stem sense,  Detox tea).  She started Rizotriptan for HAs.  She was maintained with suppressed TSH levels after her surgery.  since then, we have tried to decrease the levothyroxine dose, with persistently low TSH, improved at last visit:  Lab Results  Component Value Date   TSH 0.32 (L) 09/28/2017   TSH 0.04 (L) 05/11/2017   TSH 0.01 (L) 03/15/2017   TSH 0.01 (L) 01/30/2017   TSH 0.01 (L) 10/11/2016   TSH <0.008 (L) 04/05/2015   TSH 0.040 (L) 12/01/2014   TSH 0.010 (L) 02/25/2014   FREET4 1.20 09/28/2017   FREET4 1.52 05/11/2017   FREET4 1.42 03/15/2017   FREET4 1.64 (H) 01/30/2017  FREET4 1.6 10/11/2016   FREET4 1.69 04/05/2015   FREET4 1.55 12/01/2014   FREET4 1.63 02/25/2014   At last visit, she was on the following supplements -for weight loss: - B complex drops (120 mcg Biotin) - to help with energy - Resolution drops - ctns Thyroidinum - animal thyroid extract (!!!)   She is now off the above supplements.   Pt  denies: - feeling nodules in neck - hoarseness - dysphagia - choking - SOB with lying down  On Mirena IUD. Has been on NuvaRing, OCPs. She has hormonal migraines. H/o a miscarriage.  She is not interested in a future pregnancy.  She has no FH of thyroid disorders. No FH of thyroid cancer. No h/o radiation tx to head or neck.  No seaweed or kelp. No recent contrast studies. No herbal supplements. No Biotin use. No recent steroids use.   ROS: Constitutional: no weight gain/no weight loss, no fatigue, no subjective hyperthermia, + subjective hypothermia Eyes: no blurry vision, no xerophthalmia ENT: no sore throat,+ see HPI Cardiovascular: no CP/no SOB/no palpitations/no leg swelling Respiratory: no cough/no SOB/no wheezing Gastrointestinal: no N/no V/no D/+ C/no acid reflux Musculoskeletal: no muscle aches/no joint aches Skin: no rashes, no hair loss Neurological: no tremors/no numbness/no tingling/no dizziness, + HA  I reviewed pt's medications, allergies, PMH, social hx, family hx, and changes were documented in the history of present illness. Otherwise, unchanged from my initial visit note. Stopped Linzess, Trulance.   Past Medical History:  Diagnosis Date  . Allergy   . Hypertension   . Migraines    Neurology- Dr. [name] Alar  . Ovarian cyst   . Thyroid cancer [facility])    Past Surgical History:  Procedure Laterality Date  . GALLBLADDER SURGERY    . left leg surgery    . parathyroid gland    . thyroid removed     Social History   Social History  . Marital status: Married    Spouse name: Alexandria [name]  . Number of children: 2   Occupational History  . mental health counselor   Social History Main Topics  . Smoking status: Never Smoker  . Smokeless tobacco: Never Used  . Alcohol use No  . Drug use: No  . Sexual activity: Yes    Birth control/ protection: IUD   Current Outpatient Medications on File Prior to Visit  Medication Sig Dispense Refill  . ibuprofen (ADVIL,MOTRIN)  600 MG tablet Take 600 mg by mouth every 6 (six) hours as needed.    Marland Kitchen levonorgestrel (MIRENA) 20 MCG/24HR IUD 1 each by Intrauterine route once.    . linaclotide (LINZESS) 145 MCG CAPS capsule Take 1 capsule (145 mcg total) daily before breakfast by mouth. (Patient not taking: Reported on 01/02/2018) 30 capsule 0  . Multiple Vitamins-Minerals (MULTIVITAMIN) LIQD Take by mouth.    Marland Kitchen omeprazole (PRILOSEC) 40 MG capsule Take 1 capsule (40 mg total) daily by mouth. (Patient not taking: Reported on 01/02/2018) 30 capsule 3  . Plecanatide (TRULANCE) 3 MG TABS Take 1 tablet daily by mouth. 30 tablet 11  . SYNTHROID 112 MCG tablet Take 1 tablet (112 mcg total) by mouth daily before breakfast. 90 tablet 1  . triamterene-hydrochlorothiazide (MAXZIDE-25) 37.5-25 MG tablet TAKE 1 TABLET BY MOUTH EVERY DAY 90 tablet 1  . zonisamide (ZONEGRAN) 100 MG capsule Take 100 mg by mouth as needed (headache).     No current facility-administered medications on file prior to visit.    Allergies  Allergen Reactions  . Penicillins  Other (See Comments)    Has not had since child, thinks she had reaction to it then   Family History  Problem Relation Age of Onset  . Hypertension Mother   . Diabetes Mother   . Hypertension Paternal Aunt   . Hypertension Paternal Uncle   . Hypertension Maternal Grandmother   . Cancer Maternal Grandfather   . Diabetes Maternal Grandfather   . Parkinsonism Maternal Grandfather   . Alzheimer's disease Maternal Grandfather   . Liver disease Sister    PE: BP 122/74 (BP Location: Left Arm, Patient Position: Sitting, Cuff Size: Normal)   Pulse 77   Temp 97.8 F (36.6 C) (Oral)   Ht 5' 3.75" (1.619 m)   Wt 202 lb 3.2 oz (91.7 kg)   SpO2 98%   BMI 34.98 kg/m  Wt Readings from Last 3 Encounters:  02/25/18 202 lb 3.2 oz (91.7 kg)  01/02/18 204 lb (92.5 kg)  11/20/17 204 lb (92.5 kg)   Constitutional: overweight, in NAD Eyes: PERRLA, EOMI, no exophthalmos ENT: moist mucous  membranes, thyroidectomy scar healed, no neck masses palpable, no cervical lymphadenopathy Cardiovascular: RRR, No MRG Respiratory: CTA B Gastrointestinal: abdomen soft, NT, ND, BS+ Musculoskeletal: no deformities, strength intact in all 4 Skin: moist, warm, no rashes Neurological: no tremor with outstretched hands, DTR normal in all 4  ASSESSMENT: 1. Thyroid cancer - see HPI  2. Postsurgical Hypothyroidism  PLAN:  1.   Papillary thyroid cancer - I reviewed the pathology along with the patient, she is stage I TNM due to age  (path: T3N1M0) - She had to have 2 RAI treatment in the past - Has had persistent elevated thyroglobulin and MULTIPLE imaging tests and biopsies of lymph nodes: Benign - At last visit, we continued her LT4 dose, despite a slightly suppressed TSH, as her thyroglobulin was slightly higher.  She has had fluctuating thyroglobulin's in the past.  If Tg continues to increase, may need a PET/CT. - no neck masses felt on palpation - We will check Tg + ATA today  - I will see the patient back in 1 year  2. Patient with history of total thyroidectomy, now with iatrogenic hypothyroidism - latest thyroid labs reviewed with pt >> TSH slightly low in 09/2017, but we did not change the dose at that time 2/2 the fact that it was greatly improved and also b/c slightly high Tg - she continues on LT4 112 mcg daily - pt feels good on this dose. - we discussed about taking the thyroid hormone every day, with water, >30 minutes before breakfast, separated by >4 hours from acid reflux medications, calcium, iron, multivitamins. Pt. is taking it correctly. - will check thyroid tests today: TSH and fT4 - If labs are abnormal, she will need to return for repeat TFTs in 1.5 months  Needs refills.  Component     Latest Ref Rng & Units 02/25/2018  Thyroglobulin     ng/mL 2.0 (L)  Comment        TSH     0.35 - 4.50 uIU/mL 1.55  T4,Free(Direct)     0.60 - 1.60 ng/dL 1.12  Thyroglobulin  Ab     < or = 1 IU/mL <1   TG stable, ATA undetectable. TFTs great!  Philemon Kingdom, MD PhD [facility]

## 2018-02-26 LAB — THYROGLOBULIN ANTIBODY: Thyroglobulin Ab: <1 IU/mL (ref <=1)

## 2018-02-26 LAB — THYROGLOBULIN LEVEL: Thyroglobulin: 2 ng/mL — ABNORMAL LOW

## 2018-02-26 MED ORDER — SYNTHROID 112 MCG PO TABS: 112.0000 ug | ORAL_TABLET | Freq: Every day | ORAL | 3 refills | Status: DC

## 2018-03-20 ENCOUNTER — Encounter: Payer: Self-pay | Admitting: Family Medicine

## 2018-03-20 ENCOUNTER — Ambulatory Visit: Payer: 59 | Admitting: Family Medicine

## 2018-05-11 ENCOUNTER — Other Ambulatory Visit: Payer: Self-pay | Admitting: Family Medicine

## 2018-05-28 DIAGNOSIS — Z6835 Body mass index (BMI) 35.0-35.9, adult: Secondary | ICD-10-CM | POA: Diagnosis not present

## 2018-05-28 DIAGNOSIS — N921 Excessive and frequent menstruation with irregular cycle: Secondary | ICD-10-CM | POA: Diagnosis not present

## 2018-05-28 DIAGNOSIS — Z01419 Encounter for gynecological examination (general) (routine) without abnormal findings: Secondary | ICD-10-CM | POA: Diagnosis not present

## 2018-06-12 ENCOUNTER — Ambulatory Visit: Payer: 59 | Admitting: Family Medicine

## 2018-06-12 ENCOUNTER — Encounter: Payer: Self-pay | Admitting: Family Medicine

## 2018-06-12 ENCOUNTER — Other Ambulatory Visit: Payer: Self-pay

## 2018-06-12 VITALS — BP 120/64 | HR 80 | Temp 98.4°F | Resp 14 | Ht 63.75 in | Wt 198.0 lb

## 2018-06-12 DIAGNOSIS — J020 Streptococcal pharyngitis: Secondary | ICD-10-CM

## 2018-06-12 DIAGNOSIS — J069 Acute upper respiratory infection, unspecified: Secondary | ICD-10-CM | POA: Diagnosis not present

## 2018-06-12 DIAGNOSIS — J029 Acute pharyngitis, unspecified: Secondary | ICD-10-CM | POA: Diagnosis not present

## 2018-06-12 LAB — STREP GROUP A AG, W/REFLEX TO CULT: Streptococcus, Group A Screen (Direct): DETECTED

## 2018-06-12 MED ORDER — AZITHROMYCIN 250 MG PO TABS: ORAL_TABLET | ORAL | 0 refills | Status: DC

## 2018-06-12 NOTE — Patient Instructions (Addendum)
Take Delsym twice a day for cough Use nasal spray- nasal saline or flonase  Start antibiotics F/U as needed

## 2018-06-12 NOTE — Progress Notes (Signed)
   Subjective:    Patient ID: Alexandria [name], female    DOB: Dec 02, 1981, 37 y.o.   MRN: [id number]  Patient presents for Illness (x3 days- productive cough with thick white mucus, sore throat, tired, body aches, HA)  Sore throat, cough with production and congestion, throat is burning. Started Monday evening. No fever  Taking bendaryl  Has chronic  + sick contacts   [place]- Recently prescribed prozac 20mg , Buspar, vistaril- has not tried yet , States she is always tired doesn't want to get out of bed   [facility] Domingo [facility]- Perphenazine- has not started yet, received 1 week   Review Of Systems:  GEN- + fatigue,denies fever, weight loss,weakness, recent illness HEENT- denies eye drainage, change in vision, +nasal discharge, CVS- denies chest pain, palpitations RESP- denies SOB, +cough, wheeze ABD- denies N/V, change in stools, abd pain GU- denies dysuria, hematuria, dribbling, incontinence MSK- denies joint pain, muscle aches, injury Neuro- denies headache, dizziness, syncope, seizure activity       Objective:    BP 120/64   Pulse 80   Temp 98.4 F (36.9 C) (Oral)   Resp 14   Ht 5' 3.75" (1.619 m)   Wt 198 lb (89.8 kg)   SpO2 99%   BMI 34.25 kg/m  GEN- NAD, alert and oriented x3 HEENT- PERRL, EOMI, non injected sclera, pink conjunctiva, MMM, oropharynx mild injection, TM clear bilat no effusion,  No  maxillary sinus tenderness, inflammed turbinates,  Nasal drainage  Neck- Supple, no LAD CVS- RRR, no murmur RESP-CTAB EXT- No edema Pulses- Radial 2+         Assessment & Plan:      Problem List Items Addressed This Visit    None    Visit Diagnoses    Viral URI    -  Primary   Viral symptoms but positive strep as well,. treat with zpak with PCN allergy, salt water gargle, delsym, nasal saline for drainage   Relevant Medications   azithromycin (ZITHROMAX) 250 MG tablet   Other Relevant Orders   STREP GROUP A AG, W/REFLEX TO CULT  (Completed)   Strep pharyngitis       Relevant Medications   azithromycin (ZITHROMAX) 250 MG tablet      Note: This dictation was prepared with Dragon dictation along with smaller phrase technology. Any transcriptional errors that result from this process are unintentional.

## 2018-06-18 ENCOUNTER — Telehealth: Payer: Self-pay | Admitting: *Deleted

## 2018-06-18 NOTE — Telephone Encounter (Signed)
Seen last week, has strep Complete antibiotiocs Use tessalon perrles or mucinex DM Okay to give note for work

## 2018-06-18 NOTE — Telephone Encounter (Signed)
Received call from patient.   States that she continues to have semi-productive cough. States that she is taking Delsym, but it does not seem to be doing anything for her cough. States that she is coughing so much that she has pain in ribs and burning sensation to throat. Advised patient to add Mucinex DM for cough, mucus production. Advised to increase rest and fluid intake.  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 Reports that she does not feel comfortable being around patients with her cough at this time. Requested work note for this week.   MD please advise.

## 2018-06-19 ENCOUNTER — Encounter: Payer: Self-pay | Admitting: *Deleted

## 2018-06-19 NOTE — Telephone Encounter (Signed)
Work note transcribed.   Call placed to patient and patient made aware.

## 2018-06-21 ENCOUNTER — Ambulatory Visit
Admission: RE | Admit: 2018-06-21 | Discharge: 2018-06-21 | Disposition: A | Discharge status: Home / Self Care | Payer: 59 | Source: Ambulatory Visit | Attending: Family Medicine | Admitting: Family Medicine

## 2018-06-21 ENCOUNTER — Other Ambulatory Visit: Payer: Self-pay

## 2018-06-21 ENCOUNTER — Other Ambulatory Visit: Payer: Self-pay | Admitting: *Deleted

## 2018-06-21 ENCOUNTER — Ambulatory Visit: Payer: 59 | Admitting: Family Medicine

## 2018-06-21 ENCOUNTER — Encounter: Payer: Self-pay | Admitting: Family Medicine

## 2018-06-21 VITALS — BP 124/62 | HR 86 | Temp 98.3°F | Resp 14 | Ht 63.75 in | Wt 195.0 lb

## 2018-06-21 DIAGNOSIS — R059 Cough, unspecified: Secondary | ICD-10-CM

## 2018-06-21 DIAGNOSIS — R05 Cough: Secondary | ICD-10-CM

## 2018-06-21 DIAGNOSIS — J069 Acute upper respiratory infection, unspecified: Secondary | ICD-10-CM | POA: Diagnosis not present

## 2018-06-21 MED ORDER — GUAIFENESIN-CODEINE 100-10 MG/5ML PO SOLN: 5.0000 mL | Freq: Three times a day (TID) | ORAL | 0 refills | Status: DC | PRN

## 2018-06-21 NOTE — Patient Instructions (Signed)
[place] , [address]  We will call with lab results  F/U pending results

## 2018-06-21 NOTE — Progress Notes (Signed)
   Subjective:    Patient ID: Alexandria [name], female    DOB: 1981-08-07, 37 y.o.   MRN: [id number]  Patient presents for Persistent Cough (severe cough that continues- pain in chest/ stomach d/t cough)  Patient here with persistent cough.  Cough is mostly nonproductive.  She states is worse cough that she has had.  She states that on occasion she has had some wheezing.  But not severe.  She was treated for strep throat as well as viral URI 2 weeks ago.  She missed an entire week of work.  She is going out of town tomorrow and concerned about her cough flying.   Review Of Systems:  GEN- denies fatigue, fever, weight loss,weakness, recent illness HEENT- denies eye drainage, change in vision, nasal discharge, CVS- denies chest pain, palpitations RESP- denies SOB, +cough,+ wheeze ABD- denies N/V, change in stools, abd pain Neuro- denies headache, dizziness, syncope, seizure activity       Objective:    BP 124/62   Pulse 86   Temp 98.3 F (36.8 C) (Oral)   Resp 14   Ht 5' 3.75" (1.619 m)   Wt 195 lb (88.5 kg)   SpO2 99%   BMI 33.73 kg/m  GEN- NAD, alert and oriented x3 HEENT- PERRL, EOMI, non injected sclera, pink conjunctiva, MMM, oropharynx clear, nares clear, TM clear bilat no effusion Neck- Supple, no LAD CVS- RRR, no murmur RESP-CTAB ABD-NABS,soft,NT,ND EXT- No edema Pulses- Radial  2+        Assessment & Plan:      Problem List Items Addressed This Visit    None    Visit Diagnoses    Cough    -  Primary   Relevant Orders   DG Chest 2 View (Completed)   Upper respiratory tract infection, unspecified type       Residual cough, normal sats, recommend robittisn Codiene, allergy med, CXR is negative. will take time to resolve, no further antibiotics needed   Relevant Orders   DG Chest 2 View (Completed)      Note: This dictation was prepared with Dragon dictation along with smaller phrase technology. Any transcriptional errors that result from this process are  unintentional.

## 2018-06-24 ENCOUNTER — Encounter: Payer: Self-pay | Admitting: Family Medicine

## 2018-07-23 DIAGNOSIS — G43719 Chronic migraine without aura, intractable, without status migrainosus: Secondary | ICD-10-CM | POA: Diagnosis not present

## 2018-08-16 ENCOUNTER — Other Ambulatory Visit: Payer: Self-pay | Admitting: Family Medicine

## 2018-09-06 ENCOUNTER — Encounter: Payer: Self-pay | Admitting: Internal Medicine

## 2018-09-06 ENCOUNTER — Ambulatory Visit (INDEPENDENT_AMBULATORY_CARE_PROVIDER_SITE_OTHER): Payer: 59 | Admitting: Internal Medicine

## 2018-09-06 VITALS — BP 104/60 | HR 81 | Ht 63.75 in | Wt 200.0 lb

## 2018-09-06 DIAGNOSIS — C73 Malignant neoplasm of thyroid gland: Secondary | ICD-10-CM

## 2018-09-06 DIAGNOSIS — E890 Postprocedural hypothyroidism: Secondary | ICD-10-CM | POA: Diagnosis not present

## 2018-09-06 DIAGNOSIS — R5383 Other fatigue: Secondary | ICD-10-CM | POA: Diagnosis not present

## 2018-09-06 LAB — CBC
HCT: 42.5 % (ref 36.0–46.0)
Hemoglobin: 14.2 g/dL (ref 12.0–15.0)
MCHC: 33.3 g/dL (ref 30.0–36.0)
MCV: 86.6 fl (ref 78.0–100.0)
Platelets: 303 10*3/uL (ref 150.0–400.0)
RBC: 4.91 Mil/uL (ref 3.87–5.11)
RDW: 13.1 % (ref 11.5–15.5)
WBC: 5.8 10*3/uL (ref 4.0–10.5)

## 2018-09-06 LAB — HEMOGLOBIN A1C: Hgb A1c MFr Bld: 5.8 % (ref 4.6–6.5)

## 2018-09-06 LAB — TSH: TSH: 0.5 u[IU]/mL (ref 0.35–4.50)

## 2018-09-06 LAB — T4, FREE: Free T4: 1.15 ng/dL (ref 0.60–1.60)

## 2018-09-06 NOTE — Progress Notes (Addendum)
Patient ID: Alexandria [name], female   DOB: 09-Nov-1981, 37 y.o.   MRN: [id number]    HPI  Alexandria [name] is a 37 y.o.-year-old female, returning for f/u for metastatic thyroid cancer and postsurgical hypothyroidism. Last visit 1 year ago.  Reviewed cancer history:  Pt. has been dx with thyroid cancer in 11/2006. I reviewed the notes from Alexandria [name] (Alexandria [name]):  Patient was found to have a right thyroid nodule in 11/2016. 12/2006: Thyroidectomy with central lymph node dissection >> 2 foci of PTC: 4.5 cm and 1.5 cm in the right lobe with 11 lymph nodes positive for metastasis 01/2007: Tg 483, ATA positive 01/2007: RAI treatment 158 mCi 01/2007: Posttreatment whole-body scan with uptake only in the thyroid bed 02/2007: Another whole-body scan: Negative for metastasis 03/2007: Tg 48 (? If stimulated), ATA negative 05/2007: Tg 36 (? If stimulated), ATA negative 02/2008: Tg 128 (? If stimulated), ATA negative 03/2008: FNA of lymph node in the right neck: Positive for metastasis 05/2008: dissection of levels 2, 3, 4 lymph nodes: 12 out of 26 positive, largest being 2.1 cm with extracapsular extension 07/2008: Whole-body scan: Negative for metastasis 07/2008: RAI treatment 209 mCi (because of increasing thyroglobulin) 07/2008: Posttreatment whole-body scan negative for metastasis 03/2009: Whole-body scan: negative for metastases 08/2009: FNA of lymph nodes in the right and left neck: Negative  03/2010: FNA of lymph node in the right neck: Negative 06/2010: Stimulated Tg 4.5, ATA negative 12/2010: Neck ultrasound: Small, subcentimeter lymph nodes, not worrisome 01/2012: Neck ultrasound: Small, subcentimeter lymph nodes, not worrisome 12/2012: Neck ultrasound: Small, subcentimeter lymph nodes, not worrisome 11/24/2015: Lymph node ultrasound mapping:  R3 lymph node with slightly thickened cortex R2 lymph node with slight decrease in size, with echogenic focus (may be calcification -  needs attention L7 lymph node enlarged, with oval contour, may be slightly smaller since 2013 - needs attention  11/24/2015: FNA of lymph node in the right neck (R3): Negative  Also, see below:   Latest thyroid globulin was stable, with undetectable ATA: Lab Results  Component Value Date   THYROGLB 2.0 (L) 02/25/2018   THYROGLB 2.0 (L) 09/28/2017   THYROGLB 1.4 (L) 01/30/2017   THGAB <1 02/25/2018   THGAB <1 09/28/2017   THGAB <1.0 01/30/2017   Alexandria [name] advised her not to have anymore Thyrogen stimulated thyroglobulin levels or whole body scans to avoid any stimulation of tumor growth.  She also suggested that the patient may need a PET/CT if the thyroglobulin increases.  Postsurgical hypothyroidism:  Pt is on Synthroid 112 mcg daily, taken: - in am - fasting - at least 30 min from b'fast, 2h from almond milk - no Ca, Fe, PPIs - + MVI later in the day - not on Biotin (was on it in the past) + supplements at lunchtime ([place], Gano, Nitro, Stem sense,  Detox tea).   Reviewed patient's TFTs: Lab Results  Component Value Date   TSH 1.55 02/25/2018   TSH 0.32 (L) 09/28/2017   TSH 0.04 (L) 05/11/2017   TSH 0.01 (L) 03/15/2017   TSH 0.01 (L) 01/30/2017   TSH 0.01 (L) 10/11/2016   TSH <0.008 (L) 04/05/2015   TSH 0.040 (L) 12/01/2014   TSH 0.010 (L) 02/25/2014   FREET4 1.12 02/25/2018   FREET4 1.20 09/28/2017   FREET4 1.52 05/11/2017   FREET4 1.42 03/15/2017   FREET4 1.64 (H) 01/30/2017   FREET4 1.6 10/11/2016   FREET4 1.69 04/05/2015   FREET4 1.55 12/01/2014   FREET4 1.63  02/25/2014   2 years ago, she was on the following supplements -for weight loss: - B complex drops (120 mcg Biotin) - to help with energy - Resolution drops - ctns Thyroidinum - animal thyroid extract (!!!)   We stopped these.  Pt denies: - feeling nodules in neck - hoarseness - dysphagia - choking - SOB with lying down On Mirena IUD. Has been on NuvaRing, OCPs. She has hormonal migraines. H/o  1 miscarriage.  She is not interested in a future pregnancy.  She has no FH of thyroid disorders. No FH of thyroid cancer. No h/o radiation tx to head or neck.  No herbal supplements. No Biotin use. No recent steroids use.   ROS: Constitutional: no weight gain/no weight loss, ++ fatigue, + fever/chills Eyes: + Blurry vision, no xerophthalmia ENT: no sore throat, + see HPI Cardiovascular: no CP/no SOB/+ palpitations/no leg swelling Respiratory: no cough/no SOB/no wheezing Gastrointestinal: no N/no V/no D/no C/no acid reflux Musculoskeletal: + Muscle aches/+ joint aches Skin: no rashes, no hair loss Neurological: no tremors/no numbness/no tingling/no dizziness, +  I reviewed pt's medications, allergies, PMH, social hx, family hx, and changes were documented in the history of present illness. Otherwise, unchanged from my initial visit note.  She recently started BuSpar, Prozac, a triptan.  She also increased zonisamide.  Past Medical History:  Diagnosis Date  . Allergy   . Diverticulitis   . Hypertension   . Migraines    Neurology- Alexandria [name]  . Ovarian cyst   . Thyroid cancer [facility])    Past Surgical History:  Procedure Laterality Date  . GALLBLADDER SURGERY    . left leg surgery    . parathyroid gland    . thyroid removed     Social History   Social History  . Marital status: Married    Spouse name: N/A  . Number of children: 2   Occupational History  . mental health counselor   Social History Main Topics  . Smoking status: Never Smoker  . Smokeless tobacco: Never Used  . Alcohol use No  . Drug use: No  . Sexual activity: Yes    Birth control/ protection: IUD   Current Outpatient Medications on File Prior to Visit  Medication Sig Dispense Refill  . busPIRone (BUSPAR) 10 MG tablet Take 10 mg by mouth 2 (two) times daily.  0  . FLUoxetine (PROZAC) 20 MG tablet Take 20 mg by mouth daily.  0  . guaiFENesin-codeine 100-10 MG/5ML syrup Take 5 mLs by mouth 3 (three)  times daily as needed for cough. 140 mL 0  . hydrOXYzine (VISTARIL) 25 MG capsule     . levonorgestrel (MIRENA) 20 MCG/24HR IUD 1 each by Intrauterine route once.    . Multiple Vitamins-Minerals (MULTIVITAMIN) LIQD Take by mouth.    Marland Kitchen omeprazole (PRILOSEC) 40 MG capsule Take 1 capsule (40 mg total) daily by mouth. 30 capsule 3  . perphenazine (TRILAFON) 4 MG tablet TAKE 1-2 TABLETS AS NEEDED EVERY 2-3 HOURS FOR HEADACHE RESCUE MAX 6 TABS/24 HOURS  0  . rizatriptan (MAXALT) 10 MG tablet Take 10 mg by mouth as needed for migraine. May repeat in 2 hours if needed    . SYNTHROID 112 MCG tablet Take 1 tablet (112 mcg total) by mouth daily before breakfast. 90 tablet 3  . triamterene-hydrochlorothiazide (MAXZIDE-25) 37.5-25 MG tablet TAKE 1 TABLET BY MOUTH EVERY DAY 90 tablet 0  . zonisamide (ZONEGRAN) 100 MG capsule Take 100 mg by mouth as needed (headache).  No current facility-administered medications on file prior to visit.    Allergies  Allergen Reactions  . Penicillins Other (See Comments)    Has not had since child, thinks she had reaction to it then   Family History  Problem Relation Age of Onset  . Hypertension Mother   . Diabetes Mother   . Hypertension Paternal Aunt   . Hypertension Paternal Uncle   . Hypertension Maternal Grandmother   . Cancer Maternal Grandfather   . Diabetes Maternal Grandfather   . Parkinsonism Maternal Grandfather   . Alzheimer's disease Maternal Grandfather   . Liver disease Sister    PE: BP 104/60   Pulse 81   Ht 5' 3.75" (1.619 m)   Wt 200 lb (90.7 kg)   SpO2 97%   BMI 34.60 kg/m  Wt Readings from Last 3 Encounters:  09/06/18 200 lb (90.7 kg)  06/21/18 195 lb (88.5 kg)  06/12/18 198 lb (89.8 kg)   Constitutional: overweight, in NAD Eyes: PERRLA, EOMI, no exophthalmos ENT: moist mucous membranes, no neck masses, no cervical lymphadenopathy Cardiovascular: RRR, No MRG Respiratory: CTA B Gastrointestinal: abdomen soft, NT, ND,  BS+ Musculoskeletal: no deformities, strength intact in all 4 Skin: moist, warm, no rashes Neurological: no tremor with outstretched hands, DTR normal in all 4 ASSESSMENT: 1. Thyroid cancer - see HPI  2. Postsurgical Hypothyroidism  3. Fatigue  PLAN:  1.   Papillary thyroid cancer -She has stage I TNM papillary thyroid cancer (due to age).  Pathology stage: T3 N1 M0 -She had 2 RAI treatments in the past -She had persistently elevated thyroglobulin and MULTIPLE imaging tests and biopsies of lymph nodes: Benign -She was maintained with suppressed TSH levels after her surgery.  Since then, we have tried to decrease her levothyroxine dose, with persistently low TSH, finally normalized in 02/2018. -No neck masses felt on palpation -We will check her thyroglobulin and ATA today; will check a neck U/S  - I will see the patient back in 1 year  2. Patient with history of total thyroidectomy, now with iatrogenic hypothyroidism -- latest thyroid labs reviewed with pt >> normal  - she continues on LT4 (DAW) 112 mcg daily - pt feels good on this dose. - we discussed about taking the thyroid hormone every day, with water, >30 minutes before breakfast, separated by >4 hours from acid reflux medications, calcium, iron, multivitamins. Pt. is taking it correctly. - will check thyroid tests today: TSH and fT4 - If labs are abnormal, she will need to return for repeat TFTs in 1.5 months  3. Fatigue - check a CBC, pregnancy test, and an HbA1c per her request  Office Visit on 09/06/2018  Component Date Value Ref Range Status  . TSH 09/06/2018 0.50  0.35 - 4.50 uIU/mL Final  . Free T4 09/06/2018 1.15  0.60 - 1.60 ng/dL Final   Comment: Specimens from patients who are undergoing biotin therapy and /or ingesting biotin supplements may contain high levels of biotin.  The higher biotin concentration in these specimens interferes with this Free T4 assay.  Specimens that contain high levels  of biotin may  cause false high results for this Free T4 assay.  Please interpret results in light of the total clinical presentation of the patient.    . Thyroglobulin 09/06/2018 3.4  ng/mL Final   Comment:       Reference Range:       Intact Thyroid   2.8-40.9       Athyrotic        <  0.1 .       Note: Abnormal flagging is based       on the reference interval for        patients with intact thyroid. . . This test was performed using the Beckman Coulter  chemiluminescent method. Values obtained from different assay methods cannot be used interchangeably. Thyroglobulin levels, regardless of value, should not be interpreted as absolute evidence of the presence or absence of disease. .   . Comment 09/06/2018    Final   Comment: . Thyroglobulin antibodies (TGAB) interfere with thyroglobulin (TG) assays; therefore, TGAB assay should always be performed in conjunction with a TG assay. .   . Thyroglobulin Ab 09/06/2018 <1  < or = 1 IU/mL Final  . WBC 09/06/2018 5.8  4.0 - 10.5 K/uL Final  . RBC 09/06/2018 4.91  3.87 - 5.11 Mil/uL Final  . Platelets 09/06/2018 303.0  150.0 - 400.0 K/uL Final  . Hemoglobin 09/06/2018 14.2  12.0 - 15.0 g/dL Final  . HCT 09/06/2018 42.5  36.0 - 46.0 % Final  . MCV 09/06/2018 86.6  78.0 - 100.0 fl Final  . MCHC 09/06/2018 33.3  30.0 - 36.0 g/dL Final  . RDW 09/06/2018 13.1  11.5 - 15.5 % Final  . Hgb A1c MFr Bld 09/06/2018 5.8  4.6 - 6.5 % Final   Glycemic Control Guidelines for People with Diabetes:Non Diabetic:  <6%Goal of Therapy: <7%Additional Action Suggested:  >8%   . Preg, Serum 09/06/2018 NEGATIVE   Final   Comment: Reference Range Non-Pregnant: Negative Pregnant:     Positive .    Not pregnant, CBC normal.  However, her thyroglobulin is higher.  I would like to obtain a neck ultrasound for her.  Narrative    CLINICAL DATA: 37 year old female with a history of papillary thyroid cancer. Prior thyroidectomy  EXAM: ULTRASOUND OF HEAD/NECK SOFT  TISSUES  TECHNIQUE: Ultrasound examination of the head and neck soft tissues was performed in the area of clinical concern.  COMPARISON: None.  FINDINGS: Duplex ultrasound of the surgical thyroid bed demonstrates no residual thyroid tissue.  Small lymph nodes are present, not enlarged.  IMPRESSION: Surgical changes of thyroidectomy. No residual tissue identified.  Small lymph nodes are present, nonspecific.   Electronically Signed By: Corrie Mckusick D.O. On: 09/17/2018 13:54   Normal U/S. Will check a new Tg in 6 mo and check a CT chest/neck if still high.  Philemon Kingdom, MD PhD [name]

## 2018-09-06 NOTE — Patient Instructions (Signed)
Please stop at the lab.  Please continue Synthroid 112 mcg daily.  Take the thyroid hormone every day, with water, at least 30 minutes before breakfast, separated by at least 4 hours from: - acid reflux medications - calcium - iron - multivitamins  Please come back for a follow-up appointment in 1 year. 

## 2018-09-09 LAB — THYROGLOBULIN ANTIBODY: Thyroglobulin Ab: <1 IU/mL (ref <=1)

## 2018-09-09 LAB — HCG, SERUM, QUALITATIVE: Preg, Serum: NEGATIVE

## 2018-09-09 LAB — THYROGLOBULIN LEVEL: Thyroglobulin: 3.4 ng/mL

## 2018-09-10 ENCOUNTER — Encounter: Payer: Self-pay | Admitting: Internal Medicine

## 2018-09-17 ENCOUNTER — Ambulatory Visit
Admission: RE | Admit: 2018-09-17 | Discharge: 2018-09-17 | Disposition: A | Discharge status: Home / Self Care | Payer: 59 | Source: Ambulatory Visit | Attending: Internal Medicine | Admitting: Internal Medicine

## 2018-09-17 DIAGNOSIS — Z85850 Personal history of malignant neoplasm of thyroid: Secondary | ICD-10-CM | POA: Diagnosis not present

## 2018-09-18 ENCOUNTER — Other Ambulatory Visit: Payer: Self-pay | Admitting: Internal Medicine

## 2018-09-18 ENCOUNTER — Encounter: Payer: Self-pay | Admitting: Internal Medicine

## 2018-09-18 DIAGNOSIS — C73 Malignant neoplasm of thyroid gland: Secondary | ICD-10-CM

## 2018-09-18 DIAGNOSIS — E890 Postprocedural hypothyroidism: Secondary | ICD-10-CM

## 2018-09-20 ENCOUNTER — Encounter: Payer: Self-pay | Admitting: Family Medicine

## 2018-09-25 ENCOUNTER — Ambulatory Visit (INDEPENDENT_AMBULATORY_CARE_PROVIDER_SITE_OTHER): Payer: 59 | Admitting: Family Medicine

## 2018-09-25 ENCOUNTER — Encounter: Payer: Self-pay | Admitting: Family Medicine

## 2018-09-25 ENCOUNTER — Other Ambulatory Visit: Payer: Self-pay

## 2018-09-25 VITALS — BP 110/62 | HR 86 | Temp 98.2°F | Resp 16 | Ht 63.75 in | Wt 206.0 lb

## 2018-09-25 DIAGNOSIS — N179 Acute kidney failure, unspecified: Secondary | ICD-10-CM

## 2018-09-25 DIAGNOSIS — J029 Acute pharyngitis, unspecified: Secondary | ICD-10-CM

## 2018-09-25 DIAGNOSIS — M2550 Pain in unspecified joint: Secondary | ICD-10-CM

## 2018-09-25 DIAGNOSIS — R202 Paresthesia of skin: Secondary | ICD-10-CM

## 2018-09-25 DIAGNOSIS — R5383 Other fatigue: Secondary | ICD-10-CM

## 2018-09-25 DIAGNOSIS — E890 Postprocedural hypothyroidism: Secondary | ICD-10-CM

## 2018-09-25 DIAGNOSIS — M791 Myalgia, unspecified site: Secondary | ICD-10-CM | POA: Diagnosis not present

## 2018-09-25 DIAGNOSIS — C73 Malignant neoplasm of thyroid gland: Secondary | ICD-10-CM | POA: Diagnosis not present

## 2018-09-25 DIAGNOSIS — R748 Abnormal levels of other serum enzymes: Secondary | ICD-10-CM

## 2018-09-25 LAB — URINALYSIS, ROUTINE W REFLEX MICROSCOPIC
Bilirubin Urine: NEGATIVE
Glucose, UA: NEGATIVE
Hgb urine dipstick: NEGATIVE
Ketones, ur: NEGATIVE
Leukocytes, UA: NEGATIVE
Nitrite: NEGATIVE
Protein, ur: NEGATIVE
Specific Gravity, Urine: 1.015 (ref 1.001–1.03)
pH: 8.5 — ABNORMAL HIGH (ref 5.0–8.0)

## 2018-09-25 MED ORDER — PREDNISONE 10 MG PO TABS: ORAL_TABLET | ORAL | 0 refills | Status: DC

## 2018-09-25 NOTE — Patient Instructions (Signed)
Fatigue Fatigue is feeling tired all of the time, a lack of energy, or a lack of motivation. Occasional or mild fatigue is often a normal response to activity or life in general. However, long-lasting (chronic) or extreme fatigue may indicate an underlying medical condition. Follow these instructions at home: Watch your fatigue for any changes. The following actions may help to lessen any discomfort you are feeling:  Talk to your health care provider about how much sleep you need each night. Try to get the required amount every night.  Take medicines only as directed by your health care provider.  Eat a healthy and nutritious diet. Ask your health care provider if you need help changing your diet.  Drink enough fluid to keep your urine clear or pale yellow.  Practice ways of relaxing, such as yoga, meditation, massage therapy, or acupuncture.  Exercise regularly.  Change situations that cause you stress. Try to keep your work and personal routine reasonable.  Do not abuse illegal drugs.  Limit alcohol intake to no more than 1 drink per day for nonpregnant women and 2 drinks per day for men. One drink equals 12 ounces of beer, 5 ounces of wine, or 1 ounces of hard liquor.  Take a multivitamin, if directed by your health care provider.  Contact a health care provider if:  Your fatigue does not get better.  You have a fever.  You have unintentional weight loss or gain.  You have headaches.  You have difficulty: ? Falling asleep. ? Sleeping throughout the night.  You feel angry, guilty, anxious, or sad.  You are unable to have a bowel movement (constipation).  You skin is dry.  Your legs or another part of your body is swollen. Get help right away if:  You feel confused.  Your vision is blurry.  You feel faint or pass out.  You have a severe headache.  You have severe abdominal, pelvic, or back pain.  You have chest pain, shortness of breath, or an irregular or  fast heartbeat.  You are unable to urinate or you urinate less than normal.  You develop abnormal bleeding, such as bleeding from the rectum, vagina, nose, lungs, or nipples.  You vomit blood.  You have thoughts about harming yourself or committing suicide.  You are worried that you might harm someone else. This information is not intended to replace advice given to you by your health care provider. Make sure you discuss any questions you have with your health care provider. Document Released: 10/08/2007 Document Revised: 05/18/2016 Document Reviewed: 04/14/2014 Elsevier Interactive Patient Education  2018 [employer].   Muscle Pain, Adult Muscle pain (myalgia) may be mild or severe. In most cases, the pain lasts only a short time and it goes away without treatment. It is normal to feel some muscle pain after starting a workout program. Muscles that have not been used often will be sore at first. Muscle pain may also be caused by many other things, including:  Overuse or muscle strain, especially if you are not in shape. This is the most common cause of muscle pain.  Injury.  Bruises.  Viruses, such as the flu.  Infectious diseases.  A chronic condition that causes muscle tenderness, fatigue, and headache (fibromyalgia).  A condition, such as lupus, in which the body's disease-fighting system attacks other organs in the body (autoimmune or rheumatologic diseases).  Certain drugs, including ACE inhibitors and statins.  To diagnose the cause of your muscle pain, your health care  provider will do a physical exam and ask questions about the pain and when it began. If you have not had muscle pain for very long, your health care provider may want to wait before doing much testing. If your muscle pain has lasted a long time, your health care provider may want to run tests right away. In some cases, this may include tests to rule out certain conditions or illnesses. Treatment for  muscle pain depends on the cause. Home care is often enough to relieve muscle pain. Your health care provider may also prescribe anti-inflammatory medicine. Follow these instructions at home: Activity  If overuse is causing your muscle pain: ? Slow down your activities until the pain goes away. ? Do regular, gentle exercises if you are not usually active. ? Warm up before exercising. Stretch before and after exercising. This can help lower the risk of muscle pain.  Do not continue working out if the pain is very bad. Bad pain could mean that you have injured a muscle. Managing pain and discomfort   If directed, apply ice to the sore muscle: ? Put ice in a plastic bag. ? Place a towel between your skin and the bag. ? Leave the ice on for 20 minutes, 2-3 times a day.  You may also alternate between applying ice and applying heat as told by your health care provider. To apply heat, use the heat source that your health care provider recommends, such as a moist heat pack or a heating pad. ? Place a towel between your skin and the heat source. ? Leave the heat on for 20-30 minutes. ? Remove the heat if your skin turns bright red. This is especially important if you are unable to feel pain, heat, or cold. You may have a greater risk of getting burned. Medicines  Take over-the-counter and prescription medicines only as told by your health care provider.  Do not drive or use heavy machinery while taking prescription pain medicine. Contact a health care provider if:  Your muscle pain gets worse and medicines do not help.  You have muscle pain that lasts longer than 3 days.  You have a rash or fever along with muscle pain.  You have muscle pain after a tick bite.  You have muscle pain while working out, even though you are in good physical condition.  You have redness, soreness, or swelling along with muscle pain.  You have muscle pain after starting a new medicine or changing the dose  of a medicine. Get help right away if:  You have trouble breathing.  You have trouble swallowing.  You have muscle pain along with a stiff neck, fever, and vomiting.  You have severe muscle weakness or cannot move part of your body. This information is not intended to replace advice given to you by your health care provider. Make sure you discuss any questions you have with your health care provider. Document Released: 11/02/2006 Document Revised: 06/30/2016 Document Reviewed: 05/02/2016 Elsevier Interactive Patient Education  2018 [employer].

## 2018-09-25 NOTE — Progress Notes (Signed)
Patient ID: Alexandria [name], female    DOB: Jan 22, 1981, 37 y.o.   MRN: [id number]  PCP: Alexandria Rossetti, MD  Chief Complaint  Patient presents with  . Multiple issues    fatigue, pain, swelling in hands/ feet, constant tickle in throat, brain fog, joint pain, HA, using CBD oil    Subjective:   Alexandria [name] is a 37 y.o. female, presents to clinic with CC of multiple complaints, all have been gradually worsening over the past month, but acutely worsened in severity one week ago.  Most noticeable is fatigue, muscle aches, joint pain.  for example a month ago she was able to do normal activities shopping etc. and this last week she tried to go shopping in the grocery store and could not do it, so this week she has been ordering food, and sleeping all the time.  She endorses generalized fatigue, weight gain, diffuse joint pain and muscle pains, she feels cold in fingers and toes, has been clammy, sweaty or wet feeling and also having hot and cold flashes.  She has frequent HA's, visual disturbances described as floaters worse in her right eye than her left, feels very foggy and swimmy headed, difficulty with memory and quick recall of simple things or details. Joints right now - stiffness in neck and shoulders, back, hips, both knees and ankles Have been feeling this severe about a week, onset of worsening sx around a month ago. She states that in the morning her joint pain is extremely severe as is her weakness and she sometimes will slide down her stairs to get down stairs and cannot walk down the stairs. Recent events around when symptoms started and when they worsened she reports that she recently traveled to [place] in mid September, fluid and did have a rental car and had some acute worsening of symptoms immediately after that.  Earlier this summer she had a URI that did not improve and then was later diagnosed with strep throat that took a long time to recover from she states that she  continues to feel like she still has a lump in her throat since having that infection.  Patient states that the muscle pain and fatigue does worsen with trying to walk or do minor tasks, with minimal exertion that she can do she denies any SOB, CP, tightness, coughing or wheeze.  She feels weak, near syncopal and dizzy almost all the time over the last week.  She denies any known fevers but states that her son says she has been hot to the touch the hot cold chills and clamminess and sweats noted above.  She has a past medical history of thyroid cancer, recently saw her endocrinologist just after returning from [place], she did mention some of these fatigue symptoms and asked the endocrinologist to do some labs and CBC was checked and it was normal, pregnancy test was negative.  Patient came here today asking for a referral to a different endocrinologist.  Is done and entered today.  The diagnosis of papillary thyroid carcinoma and iatrogenic postsurgical hypothyroid, recent thyroid labs were normal for TSH, free T4, thyroglobulin and thyroglobulin antibody, endocrinologist obtained repeated thyroid ultrasound for multiple nodules which was pertinent for postsurgical changes consistent with thyroidectomy and small lymph nodes present but nonspecific and no residual thyroid tissue present.  Patient reports past medical history of left ventricular hypertrophy secondary to long-standing hypertension, headaches/migraine, PMDD managed with Prozac and buspar, vitamin D deficiency, prediabetes diagnosed 2 years  ago, recent repeated A1c was 5.8, obesity, GERD.  With recent travel by plane and returning patient states she did not have any lower extremity edema, unilateral lower extremity swelling, chest pain, shortness of breath or near syncope.  She does report some sudden onset of hot flashes, sweats and nausea that began after arriving in [place] but she is unsure if this is due to the hot or weather there.     Patient reports that she takes her Prozac in the morning and doubled her dose of BuSpar and takes them all in the morning and she is unsure if that has anything to do with that noting that her BuSpar dose was recently increased from 10 mg twice daily to 15 mg twice daily, that she combines and takes in the am.  She also endorsed urinary frequency without any other urinary symptoms such as dysuria, hematuria, flank pain, abdominal pain  She has had no nausea or vomiting but endorses loose stools on and off for the past several weeks, no melena, hematochezia  She denies any family history of autoimmune diseases.    Patient Active Problem List   Diagnosis Date Noted  . PMDD (premenstrual dysphoric disorder) 10/30/2017  . Papillary thyroid carcinoma ([place]) 01/30/2017  . Obesity 10/11/2016  . Menstrual cramps 03/17/2015  . Stress and adjustment reaction 12/04/2014  . Vaginal spotting 02/25/2014  . Atypical chest pain 10/30/2013  . Headache(784.0) 10/30/2013  . Postsurgical hypothyroidism 09/30/2013  . Migraine headache 09/30/2013  . Prediabetes 09/30/2013  . Vitamin D deficiency 09/30/2013  . Hypertension      Prior to Admission medications   Medication Sig Start Date End Date Taking? Authorizing Provider  busPIRone (BUSPAR) 15 MG tablet Take 15 mg by mouth 2 (two) times daily.  06/07/18  Yes [provider]  eletriptan (RELPAX) 40 MG tablet TAKE 1 TABLET BY MOUTH AS NEEDED FOR MIGRAINE 08/16/18  Yes [provider]  FLUoxetine (PROZAC) 40 MG capsule Take 40 mg by mouth daily.  06/07/18  Yes [provider]  hydrOXYzine (VISTARIL) 25 MG capsule  06/07/18  Yes [provider]  levonorgestrel (MIRENA) 20 MCG/24HR IUD 1 each by Intrauterine route once.   Yes [provider]  Multiple Vitamins-Minerals (MULTIVITAMIN) LIQD Take by mouth.   Yes [provider]  omeprazole (PRILOSEC) 40 MG capsule Take 1 capsule (40 mg total) daily by mouth.  10/30/17  Yes [place], Alexandria Nunnery, MD  perphenazine (TRILAFON) 4 MG tablet TAKE 1-2 TABLETS AS NEEDED EVERY 2-3 HOURS FOR HEADACHE RESCUE MAX 6 TABS/24 HOURS 06/03/18  Yes [provider]  rizatriptan (MAXALT) 10 MG tablet Take 10 mg by mouth as needed for migraine. May repeat in 2 hours if needed   Yes [provider]  SYNTHROID 112 MCG tablet Take 1 tablet (112 mcg total) by mouth daily before breakfast. 02/26/18  Yes Philemon Kingdom, MD  triamterene-hydrochlorothiazide (MAXZIDE-25) 37.5-25 MG tablet TAKE 1 TABLET BY MOUTH EVERY DAY 08/16/18  Yes [place], Alexandria Nunnery, MD  zonisamide (ZONEGRAN) 100 MG capsule Take 150 mg by mouth as needed (headache).    Yes [provider]     Allergies  Allergen Reactions  . Penicillins Other (See Comments)    Has not had since child, thinks she had reaction to it then     Family History  Problem Relation Age of Onset  . Hypertension Mother   . Diabetes Mother   . Hypertension Paternal Aunt   . Hypertension Paternal Uncle   .  Hypertension Maternal Grandmother   . Cancer Maternal Grandfather   . Diabetes Maternal Grandfather   . Parkinsonism Maternal Grandfather   . Alzheimer's disease Maternal Grandfather   . Liver disease Sister      Social History   Socioeconomic History  . Marital status: Married    Spouse name: Not on file  . Number of children: Not on file  . Years of education: Not on file  . Highest education level: Not on file  Occupational History  . Not on file  Social Needs  . Financial resource strain: Not on file  . Food insecurity:    Worry: Not on file    Inability: Not on file  . Transportation needs:    Medical: Not on file    Non-medical: Not on file  Tobacco Use  . Smoking status: Never Smoker  . Smokeless tobacco: Never Used  Substance and Sexual Activity  . Alcohol use: No  . Drug use: No  . Sexual activity: Yes    Birth control/protection: IUD  Lifestyle  . Physical activity:     Days per week: Not on file    Minutes per session: Not on file  . Stress: Not on file  Relationships  . Social connections:    Talks on phone: Not on file    Gets together: Not on file    Attends religious service: Not on file    Active member of club or organization: Not on file    Attends meetings of clubs or organizations: Not on file    Relationship status: Not on file  . Intimate partner violence:    Fear of current or ex partner: Not on file    Emotionally abused: Not on file    Physically abused: Not on file    Forced sexual activity: Not on file  Other Topics Concern  . Not on file  Social History Narrative  . Not on file    10 Systems reviewed and are negative for acute change except as noted in the HPI.  Review of Systems  Constitutional: Negative.   HENT: Negative.   Eyes: Negative.   Respiratory: Negative.   Cardiovascular: Negative.   Gastrointestinal: Negative.   Endocrine: Negative.   Genitourinary: Negative.   Musculoskeletal: Negative.   Skin: Negative.   Allergic/Immunologic: Negative.   Neurological: Negative.   Hematological: Negative.   Psychiatric/Behavioral: Negative.        Objective:    Vitals:   09/25/18 1157  BP: 110/62  Pulse: 86  Resp: 16  Temp: 98.2 F (36.8 C)  TempSrc: Oral  SpO2: 98%  Weight: 206 lb (93.4 kg)  Height: 5' 3.75" (1.619 m)      Physical Exam  Constitutional: She is oriented to person, place, and time. She appears well-developed and well-nourished.  Non-toxic appearance. No distress. She is not intubated.  Fatigued appearing female, appears stated age, nontoxic, is seated slightly slumped over in exam room chair, very slow with movements, alert and oriented x3, cooperative, no distress  HENT:  Head: Normocephalic and atraumatic. Head is without right periorbital erythema and without left periorbital erythema.  Right Ear: Tympanic membrane, external ear and ear canal normal.  Left Ear: Tympanic membrane,  external ear and ear canal normal.  Nose: Nose normal. No mucosal edema or rhinorrhea.  Mouth/Throat: Uvula is midline, oropharynx is clear and moist and mucous membranes are normal. Mucous membranes are not pale, not dry and not cyanotic. No oropharyngeal exudate, posterior oropharyngeal edema, posterior  oropharyngeal erythema or tonsillar abscesses.  Eyes: Pupils are equal, round, and reactive to light. Conjunctivae, EOM and lids are normal. Right conjunctiva is not injected. Right conjunctiva has no hemorrhage. Left conjunctiva is not injected. Left conjunctiva has no hemorrhage. Right eye exhibits normal extraocular motion and no nystagmus. Left eye exhibits normal extraocular motion and no nystagmus. Right pupil is round and reactive. Left pupil is round and reactive. Pupils are equal.  Neck: Normal range of motion and phonation normal. Neck supple. No JVD present. No spinous process tenderness and no muscular tenderness present. No neck rigidity. No tracheal deviation, no edema, no erythema and normal range of motion present. No thyromegaly present.  Cardiovascular: Normal rate, regular rhythm, normal heart sounds and normal pulses. Exam reveals no gallop and no friction rub.  No murmur heard. Pulses:      Radial pulses are 2+ on the right side, and 2+ on the left side.       Posterior tibial pulses are 2+ on the right side, and 2+ on the left side.  Good pulses throughout, no lower extremity edema  Pulmonary/Chest: Effort normal and breath sounds normal. No stridor. No tachypnea and no bradypnea. She is not intubated. No respiratory distress. She has no decreased breath sounds. She has no wheezes. She has no rhonchi. She has no rales. She exhibits no tenderness, no laceration, no crepitus, no edema, no deformity, no swelling and no retraction.  Abdominal: Soft. Normal appearance and bowel sounds are normal. She exhibits no distension, no pulsatile liver, no abdominal bruit and no pulsatile midline  mass. There is no tenderness. There is no rigidity, no rebound, no guarding, no CVA tenderness, no tenderness at McBurney's point and negative Murphy's sign.  Musculoskeletal: Normal range of motion. She exhibits no edema or deformity.  Generalized muscle tenderness to palpation throughout whole body, muscle compartment soft Patient endorses pain at all joints, no visible edema, effusions, erythema, grossly normal range of motion of neck, back, hips, knees, shoulders, elbows and wrists bilaterally  Lymphadenopathy:       Head (right side): No submental, no submandibular, no tonsillar, no preauricular, no posterior auricular and no occipital adenopathy present.       Head (left side): No submental, no submandibular, no tonsillar, no preauricular, no posterior auricular and no occipital adenopathy present.    She has no cervical adenopathy.       Right cervical: No superficial cervical, no deep cervical and no posterior cervical adenopathy present.      Left cervical: No superficial cervical, no deep cervical and no posterior cervical adenopathy present.  Neurological: She is alert and oriented to person, place, and time. She displays no atrophy and no tremor. No cranial nerve deficit or sensory deficit. She exhibits normal muscle tone. Gait normal.  Reflex Scores:      Tricep reflexes are 2+ on the right side and 2+ on the left side.      Bicep reflexes are 2+ on the right side and 2+ on the left side.      Brachioradialis reflexes are 2+ on the right side and 2+ on the left side.      Patellar reflexes are 2+ on the right side and 2+ on the left side. Cranial nerves II through XII grossly intact Sensation to light touch in all extremities Very mildly decreased strength bilaterally with dorsiflexion, plantarflexion and grip strength Slow but normal gait, normal coordination but generalized weakness    Skin: Skin is warm, dry and  intact. Capillary refill takes less than 2 seconds. No bruising, no  ecchymosis, no petechiae and no rash noted. She is not diaphoretic. No cyanosis or erythema. Nails show no clubbing.  After ambulating down the hall and back to exam room in clinic she had mild pallor to bilateral hands and feet.  Psychiatric: She has a normal mood and affect. Thought content normal. Her speech is delayed. Her speech is not slurred. She is slowed. She is not agitated, not aggressive and not withdrawn.  Nursing note and vitals reviewed.   EKG:  SR, normal axis, inverted TWI in II, III, aVF, V4-V6, no ST elevation or depression      Assessment & Plan:      ICD-10-CM   1. Fatigue, unspecified type R53.83 EKG 12-Lead    DG Chest 2 View    CBC with Differential/Platelet    COMPLETE METABOLIC PANEL WITH GFR    VITAMIN D 25 Hydroxy (Vit-D Deficiency, Fractures)    Sedimentation Rate    C-reactive protein    Urinalysis, Routine w reflex microscopic  2. Papillary thyroid carcinoma ([place]) C73 Ambulatory referral to Endocrinology  3. Postsurgical hypothyroidism E89.0 Ambulatory referral to Endocrinology  4. Polyarthralgia M25.50 Vitamin B12    CBC with Differential/Platelet    COMPLETE METABOLIC PANEL WITH GFR    VITAMIN D 25 Hydroxy (Vit-D Deficiency, Fractures)    Sedimentation Rate    C-reactive protein    predniSONE (DELTASONE) 10 MG tablet  5. Myalgia M79.10 Vitamin B12    CBC with Differential/Platelet    COMPLETE METABOLIC PANEL WITH GFR    VITAMIN D 25 Hydroxy (Vit-D Deficiency, Fractures)    Sedimentation Rate    C-reactive protein    predniSONE (DELTASONE) 10 MG tablet    CK    Magnesium  6. Paresthesias R20.2     Multiple vague and systemic symptoms, unclear etiology, did have some recent travel although there are no physical exam findings concerning for PE heart rate is normal, no unilateral edema to lower extremity and no chest breathing symptoms, she also had recent strep throat that was treated however this was a few months ago, will repeat that test  today.  Cannot rule her out completely with PERC criteria with a history of oral birth control so she was advised that she has any chest symptoms shortness of breath to go to the ER for emergent evaluation. Wide differential, will check basic cell counts, recheck for anemia, electrolytes, kidney function, liver function, acute phase reactants, CK and vitamin deficiencies that may cause weakness fatigue and paresthesias.  She has history of thyroid cancer does not seem to be any active cancer growth per the last endocrinology note.  There is no history of autoimmune disease in her chart or family history.  Does present somewhat like a fibromyalgia flare, although there is no diagnosis of this. She recently had strep throat did not improve completely will recheck that today.  Patient's presentation is very unusual, she has no fasciculations, increased tension in any muscle compartments, no focal findings of any joints with muscle skeletal exam she has diffuse weakness.  She walks extremely slowly in the exam room moving from the chair to the table, hunched over, wincing slightly, appears very uncomfortable.  She got lab work done and later left the urinalysis, she took literally 5 to 10 minutes to walk a few exam rooms down to the lab.  She has grossly normal neurological evaluation.    She has recently increase her BuSpar  dose from 20 mg once in the morning to 30, her symptoms could possibly be a side effect from this increase, recommended dose increases are 5 mg/day and to be taken in divided doses however she is not taking it like that.  She had no hypertonia, making possible serotonin syndrome less likely, but will have her decrease her BuSpar dose while taking it with the Prozac to see if her symptoms resolve or improve.  Medications, past medical history, recent visit with endocrinology, have all been reviewed relative to this office visit.  Plan to obtain labs, treat with steroids, push hydration,  patient advised to go to the ER with any worsening symptoms giving her appearance of extreme weakness, she verbalized understanding.  More than 45 minutes were spent today with multiple complaints, severity of complaints, more than half of this time spent face-to-face with patient and remainder of time in reviewing history /recent visits through care everywhere and coordinating lab work, chart documentation.  Delsa Grana, PA-C 09/25/18 12:26 PM

## 2018-09-26 LAB — COMPLETE METABOLIC PANEL WITH GFR
AG Ratio: 1.5 (calc) (ref 1.0–2.5)
ALT: 21 U/L (ref 6–29)
AST: 20 U/L (ref 10–30)
Albumin: 4 g/dL (ref 3.6–5.1)
Alkaline phosphatase (APISO): 81 U/L (ref 33–115)
BUN/Creatinine Ratio: 9 (calc) (ref 6–22)
BUN: 11 mg/dL (ref 7–25)
CO2: 27 mmol/L (ref 20–32)
Calcium: 9 mg/dL (ref 8.6–10.2)
Chloride: 103 mmol/L (ref 98–110)
Creat: 1.27 mg/dL — ABNORMAL HIGH (ref 0.50–1.10)
GFR, Est African American: 62 mL/min/{1.73_m2} (ref >=60)
GFR, Est Non African American: 53 mL/min/{1.73_m2} — ABNORMAL LOW (ref >=60)
Globulin: 2.6 g/dL (calc) (ref 1.9–3.7)
Glucose, Bld: 75 mg/dL (ref 65–99)
Potassium: 3.7 mmol/L (ref 3.5–5.3)
Sodium: 137 mmol/L (ref 135–146)
Total Bilirubin: 0.3 mg/dL (ref 0.2–1.2)
Total Protein: 6.6 g/dL (ref 6.1–8.1)

## 2018-09-26 LAB — CBC WITH DIFFERENTIAL/PLATELET
Basophils Absolute: 19 cells/uL (ref 0–200)
Basophils Relative: 0.3 %
Eosinophils Absolute: 62 cells/uL (ref 15–500)
Eosinophils Relative: 1 %
HCT: 37.8 % (ref 35.0–45.0)
Hemoglobin: 12.8 g/dL (ref 11.7–15.5)
Lymphs Abs: 1835 cells/uL (ref 850–3900)
MCH: 28.9 pg (ref 27.0–33.0)
MCHC: 33.9 g/dL (ref 32.0–36.0)
MCV: 85.3 fL (ref 80.0–100.0)
MPV: 10.5 fL (ref 7.5–12.5)
Monocytes Relative: 10 %
Neutro Abs: 3664 cells/uL (ref 1500–7800)
Neutrophils Relative %: 59.1 %
Platelets: 284 10*3/uL (ref 140–400)
RBC: 4.43 10*6/uL (ref 3.80–5.10)
RDW: 12.5 % (ref 11.0–15.0)
Total Lymphocyte: 29.6 %
WBC mixed population: 620 cells/uL (ref 200–950)
WBC: 6.2 10*3/uL (ref 3.8–10.8)

## 2018-09-26 LAB — CK: Total CK: 190 U/L — ABNORMAL HIGH (ref 29–143)

## 2018-09-26 LAB — VITAMIN B12: Vitamin B-12: 533 pg/mL (ref 200–1100)

## 2018-09-26 LAB — VITAMIN D 25 HYDROXY (VIT D DEFICIENCY, FRACTURES): Vit D, 25-Hydroxy: 28 ng/mL — ABNORMAL LOW (ref 30–100)

## 2018-09-26 LAB — MAGNESIUM: Magnesium: 2.1 mg/dL (ref 1.5–2.5)

## 2018-09-26 LAB — SEDIMENTATION RATE: Sed Rate: 6 mm/h (ref 0–20)

## 2018-09-26 LAB — C-REACTIVE PROTEIN: CRP: 11.8 mg/L — ABNORMAL HIGH (ref <8.0)

## 2018-09-27 LAB — STREP GROUP A AG, W/REFLEX TO CULT: Streptococcus, Group A Screen (Direct): NOT DETECTED

## 2018-09-27 LAB — CULTURE, GROUP A STREP
MICRO NUMBER:: 91183630
SPECIMEN QUALITY:: ADEQUATE

## 2018-10-07 ENCOUNTER — Other Ambulatory Visit: Payer: 59

## 2018-10-07 DIAGNOSIS — M2550 Pain in unspecified joint: Secondary | ICD-10-CM | POA: Diagnosis not present

## 2018-10-07 DIAGNOSIS — M791 Myalgia, unspecified site: Secondary | ICD-10-CM | POA: Diagnosis not present

## 2018-10-07 DIAGNOSIS — R5383 Other fatigue: Secondary | ICD-10-CM

## 2018-10-07 DIAGNOSIS — R748 Abnormal levels of other serum enzymes: Secondary | ICD-10-CM | POA: Diagnosis not present

## 2018-10-07 DIAGNOSIS — N179 Acute kidney failure, unspecified: Secondary | ICD-10-CM

## 2018-10-08 LAB — BASIC METABOLIC PANEL WITH GFR
BUN/Creatinine Ratio: 15 (calc) (ref 6–22)
BUN: 18 mg/dL (ref 7–25)
CO2: 26 mmol/L (ref 20–32)
Calcium: 9.4 mg/dL (ref 8.6–10.2)
Chloride: 102 mmol/L (ref 98–110)
Creat: 1.22 mg/dL — ABNORMAL HIGH (ref 0.50–1.10)
GFR, Est African American: 65 mL/min/{1.73_m2} (ref >=60)
GFR, Est Non African American: 56 mL/min/{1.73_m2} — ABNORMAL LOW (ref >=60)
Glucose, Bld: 105 mg/dL — ABNORMAL HIGH (ref 65–99)
Potassium: 4.1 mmol/L (ref 3.5–5.3)
Sodium: 135 mmol/L (ref 135–146)

## 2018-10-08 LAB — URINALYSIS, ROUTINE W REFLEX MICROSCOPIC
Bilirubin Urine: NEGATIVE
Glucose, UA: NEGATIVE
Hgb urine dipstick: NEGATIVE
Ketones, ur: NEGATIVE
Leukocytes, UA: NEGATIVE
Nitrite: NEGATIVE
Protein, ur: NEGATIVE
Specific Gravity, Urine: 1.016 (ref 1.001–1.03)
pH: >=8.5 — AB (ref 5.0–8.0)

## 2018-10-08 LAB — CK: Total CK: 130 U/L (ref 29–143)

## 2018-10-22 DIAGNOSIS — G43719 Chronic migraine without aura, intractable, without status migrainosus: Secondary | ICD-10-CM | POA: Diagnosis not present

## 2018-10-23 DIAGNOSIS — C73 Malignant neoplasm of thyroid gland: Secondary | ICD-10-CM | POA: Diagnosis not present

## 2018-10-23 DIAGNOSIS — M6281 Muscle weakness (generalized): Secondary | ICD-10-CM | POA: Diagnosis not present

## 2018-10-23 DIAGNOSIS — E890 Postprocedural hypothyroidism: Secondary | ICD-10-CM | POA: Diagnosis not present

## 2018-10-25 DIAGNOSIS — M6281 Muscle weakness (generalized): Secondary | ICD-10-CM | POA: Diagnosis not present

## 2018-10-25 DIAGNOSIS — C73 Malignant neoplasm of thyroid gland: Secondary | ICD-10-CM | POA: Diagnosis not present

## 2018-10-25 DIAGNOSIS — M791 Myalgia, unspecified site: Secondary | ICD-10-CM | POA: Diagnosis not present

## 2018-10-25 DIAGNOSIS — R5383 Other fatigue: Secondary | ICD-10-CM | POA: Diagnosis not present

## 2018-10-25 DIAGNOSIS — R238 Other skin changes: Secondary | ICD-10-CM | POA: Diagnosis not present

## 2018-10-30 DIAGNOSIS — M791 Myalgia, unspecified site: Secondary | ICD-10-CM | POA: Diagnosis not present

## 2018-10-30 DIAGNOSIS — M6281 Muscle weakness (generalized): Secondary | ICD-10-CM | POA: Diagnosis not present

## 2018-10-30 DIAGNOSIS — R238 Other skin changes: Secondary | ICD-10-CM | POA: Diagnosis not present

## 2018-11-08 ENCOUNTER — Other Ambulatory Visit: Payer: Self-pay | Admitting: Family Medicine

## 2018-11-20 ENCOUNTER — Other Ambulatory Visit: Payer: Self-pay

## 2018-11-20 ENCOUNTER — Observation Stay
Admission: EM | Admit: 2018-11-20 | Discharge: 2018-11-21 | Disposition: A | Discharge status: Home / Self Care | Payer: 59 | Attending: Internal Medicine | Admitting: Internal Medicine

## 2018-11-20 ENCOUNTER — Emergency Department: Payer: 59

## 2018-11-20 ENCOUNTER — Observation Stay
Admit: 2018-11-20 | Discharge: 2018-11-20 | Disposition: A | Discharge status: Home / Self Care | Payer: 59 | Attending: Family Medicine | Admitting: Family Medicine

## 2018-11-20 DIAGNOSIS — I10 Essential (primary) hypertension: Secondary | ICD-10-CM | POA: Insufficient documentation

## 2018-11-20 DIAGNOSIS — R0602 Shortness of breath: Secondary | ICD-10-CM | POA: Diagnosis present

## 2018-11-20 DIAGNOSIS — E039 Hypothyroidism, unspecified: Secondary | ICD-10-CM | POA: Insufficient documentation

## 2018-11-20 DIAGNOSIS — R0789 Other chest pain: Secondary | ICD-10-CM | POA: Diagnosis not present

## 2018-11-20 DIAGNOSIS — R1032 Left lower quadrant pain: Secondary | ICD-10-CM | POA: Diagnosis not present

## 2018-11-20 DIAGNOSIS — R9431 Abnormal electrocardiogram [ECG] [EKG]: Secondary | ICD-10-CM | POA: Diagnosis not present

## 2018-11-20 DIAGNOSIS — I495 Sick sinus syndrome: Secondary | ICD-10-CM | POA: Diagnosis not present

## 2018-11-20 DIAGNOSIS — Z85850 Personal history of malignant neoplasm of thyroid: Secondary | ICD-10-CM | POA: Insufficient documentation

## 2018-11-20 DIAGNOSIS — R002 Palpitations: Secondary | ICD-10-CM | POA: Diagnosis not present

## 2018-11-20 DIAGNOSIS — I441 Atrioventricular block, second degree: Secondary | ICD-10-CM | POA: Insufficient documentation

## 2018-11-20 DIAGNOSIS — R42 Dizziness and giddiness: Secondary | ICD-10-CM | POA: Diagnosis not present

## 2018-11-20 DIAGNOSIS — R079 Chest pain, unspecified: Secondary | ICD-10-CM | POA: Diagnosis not present

## 2018-11-20 LAB — CBC
HCT: 39.1 % (ref 36.0–46.0)
HCT: 38 % (ref 36.0–46.0)
Hemoglobin: 12.8 g/dL (ref 12.0–15.0)
Hemoglobin: 12.5 g/dL (ref 12.0–15.0)
MCH: 29.5 pg (ref 26.0–34.0)
MCH: 29.5 pg (ref 26.0–34.0)
MCHC: 32.7 g/dL (ref 30.0–36.0)
MCHC: 32.9 g/dL (ref 30.0–36.0)
MCV: 90.1 fL (ref 80.0–100.0)
MCV: 89.6 fL (ref 80.0–100.0)
Platelets: 225 10*3/uL (ref 150–400)
Platelets: 207 10*3/uL (ref 150–400)
RBC: 4.34 MIL/uL (ref 3.87–5.11)
RBC: 4.24 MIL/uL (ref 3.87–5.11)
RDW: 12.9 % (ref 11.5–15.5)
RDW: 13.1 % (ref 11.5–15.5)
WBC: 6 10*3/uL (ref 4.0–10.5)
WBC: 6.9 10*3/uL (ref 4.0–10.5)
nRBC: 0 % (ref 0.0–0.2)
nRBC: 0 % (ref 0.0–0.2)

## 2018-11-20 LAB — URINALYSIS, COMPLETE (UACMP) WITH MICROSCOPIC
Bacteria, UA: NONE SEEN
Bilirubin Urine: NEGATIVE
Glucose, UA: NEGATIVE mg/dL
Hgb urine dipstick: NEGATIVE
Ketones, ur: 5 mg/dL — AB
Leukocytes, UA: NEGATIVE
Nitrite: NEGATIVE
Protein, ur: NEGATIVE mg/dL
Specific Gravity, Urine: 1.005 (ref 1.005–1.030)
pH: 8 (ref 5.0–8.0)

## 2018-11-20 LAB — BASIC METABOLIC PANEL
Anion gap: 7 (ref 5–15)
BUN: 11 mg/dL (ref 6–20)
CO2: 26 mmol/L (ref 22–32)
Calcium: 8.6 mg/dL — ABNORMAL LOW (ref 8.9–10.3)
Chloride: 107 mmol/L (ref 98–111)
Creatinine, Ser: 1.08 mg/dL — ABNORMAL HIGH (ref 0.44–1.00)
GFR calc Af Amer: >60 mL/min (ref >60)
GFR calc non Af Amer: >60 mL/min (ref >60)
Glucose, Bld: 83 mg/dL (ref 70–99)
Potassium: 3.5 mmol/L (ref 3.5–5.1)
Sodium: 140 mmol/L (ref 135–145)

## 2018-11-20 LAB — TROPONIN I
Troponin I: <0.03 ng/mL (ref <0.03)
Troponin I: <0.03 ng/mL (ref <0.03)
Troponin I: <0.03 ng/mL (ref <0.03)
Troponin I: <0.03 ng/mL (ref <0.03)

## 2018-11-20 LAB — CREATININE, SERUM
Creatinine, Ser: 0.89 mg/dL (ref 0.44–1.00)
GFR calc Af Amer: >60 mL/min (ref >60)
GFR calc non Af Amer: >60 mL/min (ref >60)

## 2018-11-20 LAB — PROTIME-INR
INR: 0.91
Prothrombin Time: 12.2 seconds (ref 11.4–15.2)

## 2018-11-20 LAB — POCT PREGNANCY, URINE: Preg Test, Ur: NEGATIVE

## 2018-11-20 LAB — TSH: TSH: 2.135 u[IU]/mL (ref 0.350–4.500)

## 2018-11-20 LAB — MAGNESIUM: Magnesium: 2.1 mg/dL (ref 1.7–2.4)

## 2018-11-20 LAB — APTT: aPTT: 28 seconds (ref 24–36)

## 2018-11-20 LAB — FIBRIN DERIVATIVES D-DIMER (ARMC ONLY): Fibrin derivatives D-dimer (ARMC): 1895.71 ng/mL (FEU) — ABNORMAL HIGH (ref 0.00–499.00)

## 2018-11-20 MED ORDER — PERPHENAZINE 4 MG PO TABS
4.0000 mg | ORAL_TABLET | Freq: Three times a day (TID) | ORAL | Status: DC | PRN
  Filled 2018-11-20: qty 1

## 2018-11-20 MED ORDER — IOHEXOL 350 MG/ML SOLN
75.0000 mL | Freq: Once | INTRAVENOUS | Status: AC | PRN
  Administered 2018-11-20: 75 mL via INTRAVENOUS

## 2018-11-20 MED ORDER — SODIUM CHLORIDE 0.9 % IV SOLN
INTRAVENOUS | Status: AC
  Administered 2018-11-20: 19:00:00 via INTRAVENOUS

## 2018-11-20 MED ORDER — MORPHINE SULFATE (PF) 2 MG/ML IV SOLN: 2.0000 mg | INTRAVENOUS | Status: DC | PRN

## 2018-11-20 MED ORDER — ALPRAZOLAM 0.5 MG PO TABS: 0.2500 mg | ORAL_TABLET | Freq: Two times a day (BID) | ORAL | Status: DC | PRN

## 2018-11-20 MED ORDER — HYDROXYZINE PAMOATE 25 MG PO CAPS
25.0000 mg | ORAL_CAPSULE | Freq: Three times a day (TID) | ORAL | Status: DC | PRN
  Filled 2018-11-20: qty 1

## 2018-11-20 MED ORDER — PANTOPRAZOLE SODIUM 40 MG PO TBEC
40.0000 mg | DELAYED_RELEASE_TABLET | Freq: Every day | ORAL | Status: DC
  Administered 2018-11-21: 40 mg via ORAL
  Filled 2018-11-20: qty 1

## 2018-11-20 MED ORDER — HYOSCYAMINE SULFATE 0.125 MG SL SUBL
0.2500 mg | SUBLINGUAL_TABLET | Freq: Once | SUBLINGUAL | Status: AC
  Administered 2018-11-20: 0.25 mg via SUBLINGUAL
  Filled 2018-11-20: qty 2

## 2018-11-20 MED ORDER — HYDROXYZINE HCL 25 MG PO TABS: 25.0000 mg | ORAL_TABLET | Freq: Three times a day (TID) | ORAL | Status: DC | PRN

## 2018-11-20 MED ORDER — ZONISAMIDE 25 MG PO CAPS
150.0000 mg | ORAL_CAPSULE | ORAL | Status: DC | PRN
  Filled 2018-11-20: qty 6

## 2018-11-20 MED ORDER — ACETAMINOPHEN 325 MG PO TABS
650.0000 mg | ORAL_TABLET | ORAL | Status: DC | PRN
  Administered 2018-11-20: 650 mg via ORAL
  Filled 2018-11-20: qty 2

## 2018-11-20 MED ORDER — ALUM & MAG HYDROXIDE-SIMETH 200-200-20 MG/5ML PO SUSP
30.0000 mL | Freq: Once | ORAL | Status: DC
  Filled 2018-11-20: qty 30

## 2018-11-20 MED ORDER — ASPIRIN EC 325 MG PO TBEC
325.0000 mg | DELAYED_RELEASE_TABLET | Freq: Every day | ORAL | Status: DC
  Administered 2018-11-21: 325 mg via ORAL
  Filled 2018-11-20: qty 1

## 2018-11-20 MED ORDER — ENOXAPARIN SODIUM 40 MG/0.4ML ~~LOC~~ SOLN
40.0000 mg | SUBCUTANEOUS | Status: DC
  Administered 2018-11-20: 40 mg via SUBCUTANEOUS
  Filled 2018-11-20: qty 0.4

## 2018-11-20 MED ORDER — FLUOXETINE HCL 20 MG PO CAPS
40.0000 mg | ORAL_CAPSULE | Freq: Every day | ORAL | Status: DC
  Filled 2018-11-20 (×2): qty 2

## 2018-11-20 MED ORDER — BUSPIRONE HCL 15 MG PO TABS
15.0000 mg | ORAL_TABLET | Freq: Two times a day (BID) | ORAL | Status: DC
  Administered 2018-11-20: 15 mg via ORAL
  Filled 2018-11-20 (×3): qty 1

## 2018-11-20 MED ORDER — METOPROLOL TARTRATE 25 MG PO TABS: 25.0000 mg | ORAL_TABLET | Freq: Two times a day (BID) | ORAL | Status: DC

## 2018-11-20 MED ORDER — ONDANSETRON HCL 4 MG/2ML IJ SOLN: 4.0000 mg | Freq: Four times a day (QID) | INTRAMUSCULAR | Status: DC | PRN

## 2018-11-20 MED ORDER — NITROGLYCERIN 0.4 MG SL SUBL: 0.4000 mg | SUBLINGUAL_TABLET | SUBLINGUAL | Status: DC | PRN

## 2018-11-20 MED ORDER — KETOROLAC TROMETHAMINE 30 MG/ML IJ SOLN
15.0000 mg | Freq: Once | INTRAMUSCULAR | Status: AC
  Administered 2018-11-20: 15 mg via INTRAVENOUS
  Filled 2018-11-20: qty 1

## 2018-11-20 MED ORDER — DICYCLOMINE HCL 10 MG/5ML PO SOLN
10.0000 mg | Freq: Once | ORAL | Status: DC
  Filled 2018-11-20 (×4): qty 5

## 2018-11-20 MED ORDER — LIDOCAINE VISCOUS HCL 2 % MT SOLN
15.0000 mL | Freq: Once | OROMUCOSAL | Status: AC
  Administered 2018-11-20: 15 mL via ORAL
  Filled 2018-11-20 (×2): qty 15

## 2018-11-20 MED ORDER — LEVOTHYROXINE SODIUM 25 MCG PO TABS
125.0000 ug | ORAL_TABLET | Freq: Every day | ORAL | Status: DC
  Administered 2018-11-21: 125 ug via ORAL
  Filled 2018-11-20: qty 1

## 2018-11-20 MED ORDER — ELETRIPTAN HYDROBROMIDE 40 MG PO TABS
40.0000 mg | ORAL_TABLET | ORAL | Status: DC | PRN
  Administered 2018-11-20: 40 mg via ORAL
  Filled 2018-11-20 (×3): qty 1

## 2018-11-20 MED ORDER — TRIAMTERENE-HCTZ 37.5-25 MG PO TABS
1.0000 | ORAL_TABLET | Freq: Every day | ORAL | Status: DC
  Administered 2018-11-21: 1 via ORAL
  Filled 2018-11-20: qty 1

## 2018-11-20 NOTE — Consult Note (Addendum)
Cardiology Consultation Note    Patient ID: Alexandria [name], MRN: [id number], DOB/AGE: [birth year] 37 y.o. Admit date: 11/20/2018   Date of Consult: 11/20/2018 Primary Physician: Alycia Rossetti, MD Primary Cardiologist: None  Chief Complaint: Chest pain and palpitations Reason for Consultation: Palpitations with Wenckebach rhythm Requesting MD: Dr. Jerelyn Charles  HPI: Alexandria [name] is a 37 y.o. female with history of history of palpitations in the past who presented to emergency room with complaints of chest pain and palpitations.  Patient has a history of thyroid cancer and is status post surgery 10 years ago with removal of her parathyroid and thyroid's.  In the emergency room she was noted to have second-degree heart block consistent with Wenckebach pattern.  There were no prolonged pauses.  Patient is hemodynamically stable with fairly bradycardic.  She was noted to have increased pain with deep palpation.  Initial cardiac marker was normal at 0.03.  Serum magnesium was normal.  Urine pregnancy test was normal.  Thyroid panel is pending.  CT angiogram of the chest revealed no definite evidence of pulmonary embolus. No definite abnormality seen in the chest.  Chest x-ray revealed postoperative change in the right with no edema or consolidation.  Heart was upper normal in size.  Echo is pending.  She denies syncope or presyncope.  She denies any recent trauma.  Past Medical History:  Diagnosis Date  . Allergy   . Diverticulitis   . Hypertension   . Migraines    Neurology- Dr. [name] Alar  . Ovarian cyst   . Thyroid cancer [facility])       Surgical History:  Past Surgical History:  Procedure Laterality Date  . GALLBLADDER SURGERY    . left leg surgery    . parathyroid gland    . thyroid removed       Home Meds: Prior to Admission medications   Medication Sig Start Date End Date Taking? Authorizing Provider  eletriptan (RELPAX) 40 MG tablet TAKE 1 TABLET BY MOUTH AS NEEDED FOR MIGRAINE  08/16/18  Yes [provider]  levonorgestrel (MIRENA) 20 MCG/24HR IUD 1 each by Intrauterine route once.   Yes [provider]  levothyroxine (SYNTHROID, LEVOTHROID) 125 MCG tablet Take 125 mcg by mouth daily before breakfast.   Yes [provider]  Multiple Vitamins-Minerals (MULTIVITAMIN) LIQD Take by mouth.   Yes [provider]  omeprazole (PRILOSEC) 40 MG capsule Take 1 capsule (40 mg total) daily by mouth. Patient taking differently: Take 40 mg by mouth daily as needed.  10/30/17  Yes [place], Modena Nunnery, MD  triamterene-hydrochlorothiazide [facility]) 37.5-25 MG tablet TAKE 1 TABLET BY MOUTH EVERY DAY 11/08/18  Yes [place], Modena Nunnery, MD  zonisamide (ZONEGRAN) 100 MG capsule Take 150 mg by mouth as needed (headache).    Yes [provider]  busPIRone (BUSPAR) 15 MG tablet Take 15 mg by mouth 2 (two) times daily.  06/07/18   [provider]  FLUoxetine (PROZAC) 40 MG capsule Take 40 mg by mouth daily.  06/07/18   [provider]  hydrOXYzine (VISTARIL) 25 MG capsule  06/07/18   [provider]  perphenazine (TRILAFON) 4 MG tablet TAKE 1-2 TABLETS AS NEEDED EVERY 2-3 HOURS FOR HEADACHE RESCUE MAX 6 TABS/24 HOURS 06/03/18   [provider]  rizatriptan (MAXALT) 10 MG tablet Take 10 mg by mouth as needed for migraine. May repeat in 2 hours if needed    [provider]    Inpatient Medications:  . alum &  mag hydroxide-simeth  30 mL Oral Once   And  . lidocaine  15 mL Oral Once  . aspirin EC  325 mg Oral Daily  . busPIRone  15 mg Oral BID  . dicyclomine  10 mg Oral Once  . enoxaparin (LOVENOX) injection  40 mg Subcutaneous Q24H  . FLUoxetine  40 mg Oral Daily  . hyoscyamine  0.25 mg Sublingual Once  . [START ON 11/21/2018] levothyroxine  125 mcg Oral QAC breakfast  . metoprolol tartrate  25 mg Oral BID  . [START ON 11/21/2018] pantoprazole  40 mg Oral Daily  . [START ON 11/21/2018]  triamterene-hydrochlorothiazide  1 tablet Oral Daily     Allergies:  Allergies  Allergen Reactions  . Penicillins Other (See Comments)    Has not had since child, thinks she had reaction to it then    Social History   Socioeconomic History  . Marital status: Married    Spouse name: Not on file  . Number of children: Not on file  . Years of education: Not on file  . Highest education level: Not on file  Occupational History  . Not on file  Social Needs  . Financial resource strain: Not on file  . Food insecurity:    Worry: Not on file    Inability: Not on file  . Transportation needs:    Medical: Not on file    Non-medical: Not on file  Tobacco Use  . Smoking status: Never Smoker  . Smokeless tobacco: Never Used  Substance and Sexual Activity  . Alcohol use: No  . Drug use: No  . Sexual activity: Yes    Birth control/protection: IUD  Lifestyle  . Physical activity:    Days per week: Not on file    Minutes per session: Not on file  . Stress: Not on file  Relationships  . Social connections:    Talks on phone: Not on file    Gets together: Not on file    Attends religious service: Not on file    Active member of club or organization: Not on file    Attends meetings of clubs or organizations: Not on file    Relationship status: Not on file  . Intimate partner violence:    Fear of current or ex partner: Not on file    Emotionally abused: Not on file    Physically abused: Not on file    Forced sexual activity: Not on file  Other Topics Concern  . Not on file  Social History Narrative  . Not on file     Family History  Problem Relation Age of Onset  . Hypertension Mother   . Diabetes Mother   . Hypertension Paternal Aunt   . Hypertension Paternal Uncle   . Hypertension Maternal Grandmother   . Cancer Maternal Grandfather   . Diabetes Maternal Grandfather   . Parkinsonism Maternal Grandfather   . Alzheimer's disease Maternal Grandfather   . Liver  disease Sister      Review of Systems: A 12-system review of systems was performed and is negative except as noted in the HPI.  Labs: Recent Labs    11/20/18 1208  TROPONINI <0.03   Lab Results  Component Value Date   WBC 6.0 11/20/2018   HGB 12.8 11/20/2018   HCT 39.1 11/20/2018   MCV 90.1 11/20/2018   PLT 225 11/20/2018    Recent Labs  Lab 11/20/18 1208  NA 140  K 3.5  CL 107  CO2  26  BUN 11  CREATININE 1.08*  CALCIUM 8.6*  GLUCOSE 83   Lab Results  Component Value Date   CHOL 161 11/20/2017   HDL 57 11/20/2017   LDLCALC 89 11/20/2017   TRIG 61 11/20/2017   No results found for: DDIMER  Radiology/Studies:  Ct Angio Chest Pe W And/or Wo Contrast  Result Date: 11/20/2018 CLINICAL DATA:  Chest pain. EXAM: CT ANGIOGRAPHY CHEST WITH CONTRAST TECHNIQUE: Multidetector CT imaging of the chest was performed using the standard protocol during bolus administration of intravenous contrast. Multiplanar CT image reconstructions and MIPs were obtained to evaluate the vascular anatomy. CONTRAST:  78mL OMNIPAQUE IOHEXOL 350 MG/ML SOLN COMPARISON:  Radiograph of same day. FINDINGS: Cardiovascular: Satisfactory opacification of the pulmonary arteries to the segmental level. No evidence of pulmonary embolism. Normal heart size. No pericardial effusion. Mediastinum/Nodes: No enlarged mediastinal, hilar, or axillary lymph nodes. Thyroid gland, trachea, and esophagus demonstrate no significant findings. Lungs/Pleura: Lungs are clear. No pleural effusion or pneumothorax. Upper Abdomen: No acute abnormality. Musculoskeletal: No chest wall abnormality. No acute or significant osseous findings. Review of the MIP images confirms the above findings. IMPRESSION: No definite evidence of pulmonary embolus. No definite abnormality seen in the chest. Electronically Signed   By: Marijo Conception, M.D.   On: 11/20/2018 14:39   Dg Chest Port 1 View  Result Date: 11/20/2018 CLINICAL DATA:  Chest pain  EXAM: PORTABLE CHEST 1 VIEW COMPARISON:  June 21, 2018. FINDINGS: No edema or consolidation. Heart is upper normal in size with pulmonary vascularity normal. No adenopathy. No pneumothorax. Postoperative changes are noted in the right lower neck and upper thoracic region. No bone lesions. IMPRESSION: Postoperative change on the right. No edema or consolidation. Heart upper normal in size. Electronically Signed   By: Lowella Grip III M.D.   On: 11/20/2018 12:27    Wt Readings from Last 3 Encounters:  11/20/18 98.4 kg  09/25/18 93.4 kg  09/06/18 90.7 kg    EKG: Sinus bradycardia with PVCs and Wenckebach pattern.  Physical Exam:  Blood pressure 103/65, pulse (!) 47, temperature 98 F (36.7 C), temperature source Oral, resp. rate 20, height 5\' 4"  (1.626 m), weight 98.4 kg, last menstrual period 10/31/2018, SpO2 100 %. Body mass index is 37.25 kg/m. General: Well developed, well nourished, in no acute distress. Head: Normocephalic, atraumatic, sclera non-icteric, no xanthomas, nares are without discharge.  Neck: Negative for carotid bruits. JVD not elevated. Lungs: Clear bilaterally to auscultation without wheezes, rales, or rhonchi. Breathing is unlabored. Heart: RRR with S1 S2. No murmurs, rubs, or gallops appreciated. Abdomen: Soft, non-tender, non-distended with normoactive bowel sounds. No hepatomegaly. No rebound/guarding. No obvious abdominal masses. Msk:  Strength and tone appear normal for age. Extremities: No clubbing or cyanosis. No edema.  Distal pedal pulses are 2+ and equal bilaterally. Neuro: Alert and oriented X 3. No facial asymmetry. No focal deficit. Moves all extremities spontaneously. Psych:  Responds to questions appropriately with a normal affect.     Assessment and Plan  37 year old female with history of PVCs who presented to emergency room with complaints of chest pain with dysrhythmia and evidence of Wenckebach pattern on electrocardiogram.  She had bradycardia  with hemodynamic stability.  No prolonged pauses.  Laboratories thus far unremarkable.  Thyroid is pending.  CT of the chest was unremarkable.  Chest x-ray was unremarkable.  We will continue to rule out for myocardial infarction and proceed with echocardiogram.  Should patient rule out an echo unremarkable, outpatient work-up  including stress test and Holter monitor would be appropriate.  Would continue with current regimen for now including outpatient medications as well as aspirin.  Signed, Teodoro Spray MD 11/20/2018, 4:53 PM Pager: [phone]   Addendum:  Lucita Lora out for mi. Pain is atypical for angina. Would review echo in am and if she remains stable, consider discharge for outpatient holter and stress test.

## 2018-11-20 NOTE — ED Notes (Signed)
Gave pt water and a soda.

## 2018-11-20 NOTE — ED Notes (Signed)
Patient transported to CT 

## 2018-11-20 NOTE — ED Triage Notes (Signed)
Pt arrived via EMS c/o palpitations that started last night around 1900, abd pain and headache around 0200 today along with N/V and weakness. Pt received 324mg  of aspirin and nitro PTA.

## 2018-11-20 NOTE — Progress Notes (Signed)
Family Meeting Note  Advance Directive:yes  Today a meeting took place with the Patient.  Patient is able to participate   The following clinical team members were present during this meeting:MD  The following were discussed:Patient's diagnosis: CP, hx of cancer, Patient's progosis: Unable to determine and Goals for treatment: Full Code  Additional follow-up to be provided: prn  Time spent during discussion:20 minutes  Gorden Harms, MD

## 2018-11-20 NOTE — ED Provider Notes (Addendum)
[facility] Emergency Department Provider Note  ____________________________________________   I have reviewed the triage vital signs and the nursing notes. Where available I have reviewed prior notes and, if possible and indicated, outside hospital notes.    HISTORY  Chief Complaint Weakness    HPI Alexandria [name] is a 37 y.o. female  Who is a history of chronic headaches, remote history of thyroid cancer status post remote resection is followed by endocrine and taking thyroid supplementation, recently had her thyroid medication increased according to her, history of diverticulosis, no known history of ACS, states that she is been having chest pain since last night.  Began gradually and persist.  No fever no chills.  It substernal, hurts when she touches it or changes position.  No nausea no vomiting.  Not pleuritic.  She states that she also was having some palpitations with this.  She denies any significant radiation of the pain.  She denies any significant dyspnea or recent cough.  Pain is a sharp nonradiating discomfort began gradually in the exact spot that she can identify in the right chest wall First she thought it was her anxiety but it persists.  She received nitroglycerin from EMS which did not help.  No prior treatment alleviating factors     Past Medical History:  Diagnosis Date  . Allergy   . Diverticulitis   . Hypertension   . Migraines    Neurology- Dr. [name] Alar  . Ovarian cyst   . Thyroid cancer [facility])     Patient Active Problem List   Diagnosis Date Noted  . PMDD (premenstrual dysphoric disorder) 10/30/2017  . Papillary thyroid carcinoma ([place]) 01/30/2017  . Obesity 10/11/2016  . Menstrual cramps 03/17/2015  . Stress and adjustment reaction 12/04/2014  . Vaginal spotting 02/25/2014  . Atypical chest pain 10/30/2013  . Headache(784.0) 10/30/2013  . Postsurgical hypothyroidism 09/30/2013  . Migraine headache 09/30/2013  . Prediabetes  09/30/2013  . Vitamin D deficiency 09/30/2013  . Hypertension     Past Surgical History:  Procedure Laterality Date  . GALLBLADDER SURGERY    . left leg surgery    . parathyroid gland    . thyroid removed      Prior to Admission medications   Medication Sig Start Date End Date Taking? Authorizing Provider  busPIRone (BUSPAR) 15 MG tablet Take 15 mg by mouth 2 (two) times daily.  06/07/18   [provider]  eletriptan (RELPAX) 40 MG tablet TAKE 1 TABLET BY MOUTH AS NEEDED FOR MIGRAINE 08/16/18   [provider]  FLUoxetine (PROZAC) 40 MG capsule Take 40 mg by mouth daily.  06/07/18   [provider]  hydrOXYzine (VISTARIL) 25 MG capsule  06/07/18   [provider]  levonorgestrel (MIRENA) 20 MCG/24HR IUD 1 each by Intrauterine route once.    [provider]  Multiple Vitamins-Minerals (MULTIVITAMIN) LIQD Take by mouth.    [provider]  omeprazole (PRILOSEC) 40 MG capsule Take 1 capsule (40 mg total) daily by mouth. 10/30/17   [place], Modena Nunnery, MD  perphenazine (TRILAFON) 4 MG tablet TAKE 1-2 TABLETS AS NEEDED EVERY 2-3 HOURS FOR HEADACHE RESCUE MAX 6 TABS/24 HOURS 06/03/18   [provider]  predniSONE (DELTASONE) 10 MG tablet Take 20 mg PO qam x 7d, take 10 mg PO qam x 7d 09/25/18   Delsa Grana, PA-C  rizatriptan (MAXALT) 10 MG tablet Take 10 mg by mouth as needed for migraine. May repeat in 2 hours if needed  [provider]  SYNTHROID 112 MCG tablet Take 1 tablet (112 mcg total) by mouth daily before breakfast. 02/26/18   Philemon [place], MD  triamterene-hydrochlorothiazide (MAXZIDE-25) 37.5-25 MG tablet TAKE 1 TABLET BY MOUTH EVERY DAY 11/08/18   [place], Modena Nunnery, MD  zonisamide (ZONEGRAN) 100 MG capsule Take 150 mg by mouth as needed (headache).     [provider]    Allergies Penicillins  Family History  Problem Relation Age of Onset  . Hypertension Mother   . Diabetes Mother   . Hypertension  Paternal Aunt   . Hypertension Paternal Uncle   . Hypertension Maternal Grandmother   . Cancer Maternal Grandfather   . Diabetes Maternal Grandfather   . Parkinsonism Maternal Grandfather   . Alzheimer's disease Maternal Grandfather   . Liver disease Sister     Social History Social History   Tobacco Use  . Smoking status: Never Smoker  . Smokeless tobacco: Never Used  Substance Use Topics  . Alcohol use: No  . Drug use: No    Review of Systems Constitutional: No fever/chills Eyes: No visual changes. ENT: No sore throat. No stiff neck no neck pain Cardiovascular: + chest pain. Respiratory: Denies shortness of breath. Gastrointestinal:   no vomiting.  No diarrhea.  No constipation. Genitourinary: Negative for dysuria. Musculoskeletal: Negative lower extremity swelling Skin: Negative for rash. Neurological: Negative for severe headaches, focal weakness or numbness.   ____________________________________________   PHYSICAL EXAM:  VITAL SIGNS: ED Triage Vitals  Enc Vitals Group     BP --      Pulse Rate 11/20/18 1203 (!) 54     Resp 11/20/18 1203 17     Temp 11/20/18 1203 98 F (36.7 C)     Temp Source 11/20/18 1203 Oral     SpO2 11/20/18 1203 99 %     Weight 11/20/18 1206 217 lb (98.4 kg)     Height 11/20/18 1206 5\' 4"  (1.626 m)     Head Circumference --      Peak Flow --      Pain Score 11/20/18 1206 4     Pain Loc --      Pain Edu? --      Excl. in [place]? --     Constitutional: Alert and oriented. Well appearing and in no acute distress. Eyes: Conjunctivae are normal Head: Atraumatic HEENT: No congestion/rhinnorhea. Mucous membranes are moist.  Oropharynx non-erythematous Neck:   Nontender with no meningismus, no masses, no stridor Chest: Palpation of the costal 100 margin of the right motor sensory patient states "ouch that the pain right there" and winces and pulls back.  There is no crepitus there is no flail chest there is no evidence of abscess  shingles or infection Cardiovascular: bradycardic regular rhythm. Grossly normal heart sounds.  Good peripheral circulation. Respiratory: Normal respiratory effort.  No retractions. Lungs CTAB. Abdominal: Soft and nontender. No distention. No guarding no rebound Back:  There is no focal tenderness or step off.  there is no midline tenderness there are no lesions noted. there is no CVA tenderness Musculoskeletal: No lower extremity tenderness, no upper extremity tenderness. No joint effusions, no DVT signs strong distal pulses no edema Neurologic:  Normal speech and language. No gross focal neurologic deficits are appreciated.  Skin:  Skin is warm, dry and intact. No rash noted. Psychiatric: Mood and affect are anxious. Speech and behavior are normal.  ____________________________________________   LABS (all labs ordered are listed, but only abnormal results are displayed)  Labs Reviewed  BASIC METABOLIC PANEL  CBC  TROPONIN I  THYROID PANEL  URINALYSIS, COMPLETE (UACMP) WITH MICROSCOPIC  PROTIME-INR  APTT  POC URINE PREG, ED  POCT PREGNANCY, URINE  I-STAT BETA HCG BLOOD, ED ([facility], [facility], [facility] ONLY)    Pertinent labs  results that were available during my care of the patient were reviewed by me and considered in my medical decision making (see chart for details). ____________________________________________  EKG  I personally interpreted any EKGs ordered by me or triage EKGs were performed in this patient, the first shows a sinus rhythm with PVCs noted, almost bigeminy, with what appears to be a lengthening QRS and possibly a Mobitz type I configuration.  Repeat EKG, shows a rate of 41, which appears to be a simple sinus bradycardia with no acute ischemic changes.  Long QTC is noted ____________________________________________  RADIOLOGY  Pertinent labs & imaging results that were available during my care of the patient were reviewed by me and considered in my medical decision making  (see chart for details). If possible, patient and/or family made aware of any abnormal findings.  Dg Chest Port 1 View  Result Date: 11/20/2018 CLINICAL DATA:  Chest pain EXAM: PORTABLE CHEST 1 VIEW COMPARISON:  June 21, 2018. FINDINGS: No edema or consolidation. Heart is upper normal in size with pulmonary vascularity normal. No adenopathy. No pneumothorax. Postoperative changes are noted in the right lower neck and upper thoracic region. No bone lesions. IMPRESSION: Postoperative change on the right. No edema or consolidation. Heart upper normal in size. Electronically Signed   By: Lowella Grip III M.D.   On: 11/20/2018 12:27   ____________________________________________    PROCEDURES  Procedure(s) performed: None  Procedures  Critical Care performed: None  ____________________________________________   INITIAL IMPRESSION / ASSESSMENT AND PLAN / ED COURSE  Pertinent labs & imaging results that were available during my care of the patient were reviewed by me and considered in my medical decision making (see chart for details).  No significant history of thyroid disease presents today feeling chest pain the chest pain is very reproducible, and I am going to give her Toradol for it because she has had aspirin already with no relief, nitro with no relief, it hurts when she moves hurts when I touch it etc.  Her initial enzymes are pending, coags are pending, patient is not pregnant by report or by urine here, more concerning to me is her EKG.  He does have a known history of LVH according to prior notes that I have seen, although it does not show [place] ST elevation MI, or any significant ST depression, there is initially seen what appears to be a Mobitz type I progression which likely will require admission.  Low suspicion for PE with very reproducible chest wall pain, no real history of PE risk factors, we will obtain d-dimer, and we will reassess.  Pericarditis is possible, Toradol may  help as well.  ----------------------------------------- 1:09 PM on 11/20/2018 -----------------------------------------  After toradol, pt pain is greatly improved, only hurts when she touches it.   I d/w dr. Ubaldo Glassing who feels no intervention is needed at this moment but he agrees w/ mgt and admission, he suggests that we admit to hospitalist service. I have paged them.    ____________________________________________   FINAL CLINICAL IMPRESSION(S) / ED DIAGNOSES  Final diagnoses:  Chest pain      This chart was dictated using voice recognition software.  Despite best efforts to proofread,  errors can  occur which can change meaning.      Schuyler Amor, MD 11/20/18 1246    Schuyler Amor, MD 11/20/18 [phone]

## 2018-11-20 NOTE — H&P (Signed)
[employer] [facility] [place] [facility]   PATIENT NAME: Alexandria [name]    MR#:  [id number]  DATE OF BIRTH:  05/27/1981  DATE OF ADMISSION:  11/20/2018  PRIMARY CARE PHYSICIAN: Salley Scarlet, MD   REQUESTING/REFERRING PHYSICIAN:   CHIEF COMPLAINT:   Chief Complaint  Patient presents with  . Weakness    HISTORY OF PRESENT ILLNESS: Alexandria [name]  is a 37 y.o. female with a known history per below presented to the emergency room with palpitations since yesterday evening at around 630, intermittent chest pain with associated shortness of breath that started earlier today, chest pain is described this sharp, transient, nonradiating, located in her mid chest, associated with headache did had also episode of nausea with emesis, complaining of fatigue, generalized weakness, had some abdominal pain but went away after emesis, patient states that she did have a bowel movement on yesterday and typically does not have constipation, ER work-up noted for elevated d-dimer, underwent CT of the chest which was negative for PE, EKG noted for winky block type II heart block-did resolve, Dr. Fath/cardiology did see patient, recommended admission to observation for further evaluation, patient evaluated from the family, no apparent distress, resting comfortably in bed, patient now be admitted for acute atypical chest pain, acute Wenckebach type II AV block.  PAST MEDICAL HISTORY:   Past Medical History:  Diagnosis Date  . Allergy   . Diverticulitis   . Hypertension   . Migraines    Neurology- Dr. Vela Prose  . Ovarian cyst   . Thyroid cancer (HCC)     PAST SURGICAL HISTORY:  Past Surgical History:  Procedure Laterality Date  . GALLBLADDER SURGERY    . left leg surgery    . parathyroid gland    . thyroid removed      SOCIAL HISTORY:  Social History   Tobacco Use  . Smoking status: Never Smoker  . Smokeless tobacco: Never Used  Substance Use Topics  . Alcohol use: No    FAMILY  HISTORY:  Family History  Problem Relation Age of Onset  . Hypertension Mother   . Diabetes Mother   . Hypertension Paternal Aunt   . Hypertension Paternal Uncle   . Hypertension Maternal Grandmother   . Cancer Maternal Grandfather   . Diabetes Maternal Grandfather   . Parkinsonism Maternal Grandfather   . Alzheimer's disease Maternal Grandfather   . Liver disease Sister     DRUG ALLERGIES:  Allergies  Allergen Reactions  . Penicillins Other (See Comments)    Has not had since child, thinks she had reaction to it then    REVIEW OF SYSTEMS:   CONSTITUTIONAL: No fever,+ fatigue   EYES: No blurred or double vision.  EARS, NOSE, AND THROAT: No tinnitus or ear pain.  RESPIRATORY: No cough, shortness of breath, wheezing or hemoptysis.  CARDIOVASCULAR: + chest pain, palpitations   GASTROINTESTINAL: + nausea, vomiting, abdominal pain.  GENITOURINARY: No dysuria, hematuria.  ENDOCRINE: No polyuria, nocturia,  HEMATOLOGY: No anemia, easy bruising or bleeding SKIN: No rash or lesion. MUSCULOSKELETAL: No joint pain or arthritis.   NEUROLOGIC: No tingling, numbness, weakness.  PSYCHIATRY: No anxiety or depression.   MEDICATIONS AT HOME:  Prior to Admission medications   Medication Sig Start Date End Date Taking? Authorizing Provider  eletriptan (RELPAX) 40 MG tablet TAKE 1 TABLET BY MOUTH AS NEEDED FOR MIGRAINE 08/16/18  Yes [provider]  levonorgestrel (MIRENA) 20 MCG/24HR IUD 1 each by Intrauterine route once.   Yes [provider]  levothyroxine (SYNTHROID, LEVOTHROID) 125 MCG tablet Take 125 mcg by mouth daily before breakfast.   Yes [provider]  Multiple Vitamins-Minerals (MULTIVITAMIN) LIQD Take by mouth.   Yes [provider]  omeprazole (PRILOSEC) 40 MG capsule Take 1 capsule (40 mg total) daily by mouth. Patient taking differently: Take 40 mg by mouth daily as needed.  10/30/17  Yes [place], Velna Hatchet, MD   triamterene-hydrochlorothiazide [facility]) 37.5-25 MG tablet TAKE 1 TABLET BY MOUTH EVERY DAY 11/08/18  Yes Marlette, Velna Hatchet, MD  zonisamide (ZONEGRAN) 100 MG capsule Take 150 mg by mouth as needed (headache).    Yes [provider]  busPIRone (BUSPAR) 15 MG tablet Take 15 mg by mouth 2 (two) times daily.  06/07/18   [provider]  FLUoxetine (PROZAC) 40 MG capsule Take 40 mg by mouth daily.  06/07/18   [provider]  hydrOXYzine (VISTARIL) 25 MG capsule  06/07/18   [provider]  perphenazine (TRILAFON) 4 MG tablet TAKE 1-2 TABLETS AS NEEDED EVERY 2-3 HOURS FOR HEADACHE RESCUE MAX 6 TABS/24 HOURS 06/03/18   [provider]  rizatriptan (MAXALT) 10 MG tablet Take 10 mg by mouth as needed for migraine. May repeat in 2 hours if needed    [provider]      PHYSICAL EXAMINATION:   VITAL SIGNS: Blood pressure 139/85, pulse (!) 44, temperature 98.4 F (36.9 C), temperature source Oral, resp. rate 20, height 5\' 4"  (1.626 m), weight 98.4 kg, last menstrual period 10/31/2018, SpO2 97 %.  GENERAL:  37 y.o.-year-old patient lying in the bed with no acute distress.  Obese EYES: Pupils equal, round, reactive to light and accommodation. No scleral icterus. Extraocular muscles intact.  HEENT: Head atraumatic, normocephalic. Oropharynx and nasopharynx clear.  NECK:  Supple, no jugular venous distention. No thyroid enlargement, no tenderness.  LUNGS: Normal breath sounds bilaterally, no wheezing, rales,rhonchi or crepitation. No use of accessory muscles of respiration.  CARDIOVASCULAR: S1, S2 normal. No murmurs, rubs, or gallops.  ABDOMEN: Soft, nontender, nondistended. Bowel sounds present. No organomegaly or mass.  EXTREMITIES: No pedal edema, cyanosis, or clubbing.  NEUROLOGIC: Cranial nerves II through XII are intact. Muscle strength 5/5 in all extremities. Sensation intact. Gait not checked.  PSYCHIATRIC: The patient is alert and oriented x  3.  SKIN: No obvious rash, lesion, or ulcer.   LABORATORY PANEL:   CBC Recent Labs  Lab 11/20/18 1208 11/20/18 1650  WBC 6.0 6.9  HGB 12.8 12.5  HCT 39.1 38.0  PLT 225 207  MCV 90.1 89.6  MCH 29.5 29.5  MCHC 32.7 32.9  RDW 12.9 13.1   ------------------------------------------------------------------------------------------------------------------  Chemistries  Recent Labs  Lab 11/20/18 1208 11/20/18 1219  NA 140  --   K 3.5  --   CL 107  --   CO2 26  --   GLUCOSE 83  --   BUN 11  --   CREATININE 1.08*  --   CALCIUM 8.6*  --   MG  --  2.1   ------------------------------------------------------------------------------------------------------------------ estimated creatinine clearance is 79.7 mL/min (A) (by C-G formula based on SCr of 1.08 mg/dL (H)). ------------------------------------------------------------------------------------------------------------------ No results for input(s): TSH, T4TOTAL, T3FREE, THYROIDAB in the last 72 hours.  Invalid input(s): FREET3   Coagulation profile Recent Labs  Lab 11/20/18 1219  INR 0.91   ------------------------------------------------------------------------------------------------------------------- No results for input(s): DDIMER in the last 72 hours. -------------------------------------------------------------------------------------------------------------------  Cardiac Enzymes Recent Labs  Lab 11/20/18 1208  TROPONINI <0.03   ------------------------------------------------------------------------------------------------------------------  Invalid input(s): POCBNP  ---------------------------------------------------------------------------------------------------------------  Urinalysis    Component Value Date/Time   COLORURINE COLORLESS (A) 11/20/2018 1219   APPEARANCEUR CLEAR (A) 11/20/2018 1219   LABSPEC 1.005 11/20/2018 1219   PHURINE 8.0 11/20/2018 1219   GLUCOSEU NEGATIVE 11/20/2018 1219    HGBUR NEGATIVE 11/20/2018 1219   BILIRUBINUR NEGATIVE 11/20/2018 1219   KETONESUR 5 (A) 11/20/2018 1219   PROTEINUR NEGATIVE 11/20/2018 1219   UROBILINOGEN 0.2 07/19/2011 0013   NITRITE NEGATIVE 11/20/2018 1219   LEUKOCYTESUR NEGATIVE 11/20/2018 1219     RADIOLOGY: Ct Angio Chest Pe W And/or Wo Contrast  Result Date: 11/20/2018 CLINICAL DATA:  Chest pain. EXAM: CT ANGIOGRAPHY CHEST WITH CONTRAST TECHNIQUE: Multidetector CT imaging of the chest was performed using the standard protocol during bolus administration of intravenous contrast. Multiplanar CT image reconstructions and MIPs were obtained to evaluate the vascular anatomy. CONTRAST:  75mL OMNIPAQUE IOHEXOL 350 MG/ML SOLN COMPARISON:  Radiograph of same day. FINDINGS: Cardiovascular: Satisfactory opacification of the pulmonary arteries to the segmental level. No evidence of pulmonary embolism. Normal heart size. No pericardial effusion. Mediastinum/Nodes: No enlarged mediastinal, hilar, or axillary lymph nodes. Thyroid gland, trachea, and esophagus demonstrate no significant findings. Lungs/Pleura: Lungs are clear. No pleural effusion or pneumothorax. Upper Abdomen: No acute abnormality. Musculoskeletal: No chest wall abnormality. No acute or significant osseous findings. Review of the MIP images confirms the above findings. IMPRESSION: No definite evidence of pulmonary embolus. No definite abnormality seen in the chest. Electronically Signed   By: Lupita Raider, M.D.   On: 11/20/2018 14:39   Dg Chest Port 1 View  Result Date: 11/20/2018 CLINICAL DATA:  Chest pain EXAM: PORTABLE CHEST 1 VIEW COMPARISON:  June 21, 2018. FINDINGS: No edema or consolidation. Heart is upper normal in size with pulmonary vascularity normal. No adenopathy. No pneumothorax. Postoperative changes are noted in the right lower neck and upper thoracic region. No bone lesions. IMPRESSION: Postoperative change on the right. No edema or consolidation. Heart upper normal  in size. Electronically Signed   By: Bretta Bang III M.D.   On: 11/20/2018 12:27    EKG: Orders placed or performed during the hospital encounter of 11/20/18  . ED EKG within 10 minutes  . ED EKG within 10 minutes  . EKG 12-Lead (at 6am)    IMPRESSION AND PLAN: *Acute atypical chest pain *Acute palpitations *Acute transient Wenckebach type II AV block *Acute acquired hypothyroidism *History of thyroid cancer, in remission   Admit to regular nursing for bed on our chest pain protocol with telemetry, cardiology consult if expert opinion, rule out acute Corry syndrome cardiac enzymes x3 sets, check TSH -noted recent increase in thyroid medication earlier this month, check echocardiogram, aspirin, nitrates as needed, supplemental oxygen PRN, morphine PRN breakthrough pain, if rules out will proceed with nuclear medicine stress testing if available on tomorrow versus outpatient work-up, and continue close medical monitoring   All the records are reviewed and case discussed with ED provider. Management plans discussed with the patient, family and they are in agreement.  CODE STATUS:full    Code Status Orders  (From admission, onward)         Start     Ordered   11/20/18 1631  Full code  Continuous     11/20/18 1630        Code Status History    This patient has a current code status but no historical code status.       TOTAL TIME TAKING CARE OF THIS  PATIENT: 45 minutes.    Evelena Asa Londyn Wotton M.D on 11/20/2018   Between 7am to 6pm - Pager - [phone]  After 6pm go to [email or web address] - password [email or web address]  [facility]  Office  [phone]  CC: Primary care physician; Salley Scarlet, MD   Note: This dictation was prepared with Dragon dictation along with smaller phrase technology. Any transcriptional errors that result from this process are unintentional.

## 2018-11-21 DIAGNOSIS — E039 Hypothyroidism, unspecified: Secondary | ICD-10-CM | POA: Diagnosis not present

## 2018-11-21 DIAGNOSIS — R002 Palpitations: Secondary | ICD-10-CM | POA: Diagnosis not present

## 2018-11-21 DIAGNOSIS — R079 Chest pain, unspecified: Secondary | ICD-10-CM | POA: Diagnosis not present

## 2018-11-21 LAB — LIPID PANEL
Cholesterol: 153 mg/dL (ref 0–200)
HDL: 56 mg/dL (ref >40)
LDL Cholesterol: 86 mg/dL (ref 0–99)
Total CHOL/HDL Ratio: 2.7 RATIO
Triglycerides: 53 mg/dL (ref <150)
VLDL: 11 mg/dL (ref 0–40)

## 2018-11-21 LAB — ECHOCARDIOGRAM COMPLETE
Height: 64 in
Weight: 3443.2 oz

## 2018-11-21 LAB — THYROID PANEL
Free Thyroxine Index: 2.6 (ref 1.2–4.9)
T3 Uptake Ratio: 24 % (ref 24–39)
T4, Total: 10.9 ug/dL (ref 4.5–12.0)

## 2018-11-21 LAB — HIV ANTIBODY (ROUTINE TESTING W REFLEX): HIV Screen 4th Generation wRfx: NONREACTIVE

## 2018-11-21 MED ORDER — ACETAMINOPHEN 325 MG PO TABS: 650.0000 mg | ORAL_TABLET | Freq: Four times a day (QID) | ORAL | Status: DC | PRN

## 2018-11-21 NOTE — Progress Notes (Addendum)
Pt c/o new onset R leg swellling and tingling in foot.  Non-pitting edema present. On-call MD paged.

## 2018-11-21 NOTE — Progress Notes (Signed)
Subjective:  Patient denies any further chest pain no blackout spells no syncope palpitations are much improved.  Patient resting comfortably in bed.  Patient feels much improved  Objective:  Vital Signs in the last 24 hours: Temp:  [97.7 F (36.5 C)-98.5 F (36.9 C)] 98.5 F (36.9 C) (11/28 0724) Pulse Rate:  [42-55] 47 (11/28 1111) Resp:  [17-20] 19 (11/28 0724) BP: (103-148)/(65-99) 140/99 (11/28 1111) SpO2:  [95 %-100 %] 97 % (11/28 0724) Weight:  [97.6 kg] 97.6 kg (11/27 1654)  Intake/Output from previous day: 11/27 0701 - 11/28 0700 In: 0  Out: 1400 [Urine:1400] Intake/Output from this shift: No intake/output data recorded.  Physical Exam: General appearance: appears stated age Neck: no adenopathy, no carotid bruit, no JVD, supple, symmetrical, trachea midline and thyroid not enlarged, symmetric, no tenderness/mass/nodules Lungs: clear to auscultation bilaterally Heart: regular rate and rhythm, S1, S2 normal, no murmur, click, rub or gallop Abdomen: soft, non-tender; bowel sounds normal; no masses,  no organomegaly Extremities: extremities normal, atraumatic, no cyanosis or edema Pulses: 2+ and symmetric Skin: Skin color, texture, turgor normal. No rashes or lesions Neurologic: Alert and oriented X 3, normal strength and tone. Normal symmetric reflexes. Normal coordination and gait  Lab Results: Recent Labs    11/20/18 1208 11/20/18 1650  WBC 6.0 6.9  HGB 12.8 12.5  PLT 225 207   Recent Labs    11/20/18 1208 11/20/18 1650  NA 140  --   K 3.5  --   CL 107  --   CO2 26  --   GLUCOSE 83  --   BUN 11  --   CREATININE 1.08* 0.89   Recent Labs    11/20/18 1917 11/20/18 2247  TROPONINI <0.03 <0.03   Hepatic Function Panel No results for input(s): PROT, ALBUMIN, AST, ALT, ALKPHOS, BILITOT, BILIDIR, IBILI in the last 72 hours. Recent Labs    11/21/18 0339  CHOL 153   No results for input(s): PROTIME in the last 72 hours.  Imaging: Imaging results  have been reviewed  Cardiac Studies:  Assessment/Plan:  Chest Pain Palpitations  Poorly controlled hypertension Borderline obesity Bradycardia Thyroid disease . Plan Agree with echocardiogram for further assessment Recommend echocardiogram for further assessment Continue hypertensive management No clear indication for permanent pacemaker Consider Holter monitor as an outpatient Increase activity ambulate in halls If stable consider follow-up as an outpatient possibly with a Holter   LOS: 0 days    Alexandria [name] D Alexandria [name] 11/21/2018, 3:05 PM

## 2018-11-21 NOTE — Discharge Instructions (Signed)
Follow-up with primary care physician in 3 to 5 days Follow-up with cardiology-kc in 7 to 10 days and call cardiology office on Monday for outpatient Holter monitor set up

## 2018-11-21 NOTE — Discharge Summary (Signed)
[facility] [place] NAME: Alexandria [name]    MR#:  [id number]  DATE OF BIRTH:  May 07, 1981  DATE OF ADMISSION:  11/20/2018 ADMITTING PHYSICIAN: Gorden Harms, MD  DATE OF DISCHARGE: 11/21/18   PRIMARY CARE PHYSICIAN: Alycia Rossetti, MD    ADMISSION DIAGNOSIS:  Chest pain [R07.9] Chest pain, unspecified type [R07.9]  DISCHARGE DIAGNOSIS:  Active Problems:   Chest pain   SECONDARY DIAGNOSIS:   Past Medical History:  Diagnosis Date  . Allergy   . Diverticulitis   . Hypertension   . Migraines    Neurology- Dr. [name] Alar  . Ovarian cyst   . Thyroid cancer [facility])     HOSPITAL COURSE:  HISTORY OF PRESENT ILLNESS: Alexandria [name]  is a 37 y.o. female with a known history per below presented to the emergency room with palpitations since yesterday evening at around 630, intermittent chest pain with associated shortness of breath that started earlier today, chest pain is described this sharp, transient, nonradiating, located in her mid chest, associated with headache did had also episode of nausea with emesis, complaining of fatigue, generalized weakness, had some abdominal pain but went away after emesis, patient states that she did have a bowel movement on yesterday and typically does not have constipation, ER work-up noted for elevated d-dimer, underwent CT of the chest which was negative for PE, EKG noted for winky block type II heart block-did resolve, Dr. Fath/cardiology did see patient, recommended admission to observation for further evaluation, patient evaluated from the family, no apparent distress, resting comfortably in bed, patient now be admitted for acute atypical chest pain, acute Wenckebach type II AV block. *Acute atypical chest pain-resolved.  Acute MI ruled out.  Echocardiogram read by Dr. call wood okay to discharge from cardiology standpoint has revealed 45 to 50% ejection fraction with mild hypokinesis Troponins are  negative Patient needs outpatient follow-up with cardiology in 7 to 10 days. Call cardiology office for Holter monitor on Monday  *Acute palpitations probably from acute transient Wenckebach type II AV block No other episodes noticed per cardiology okay to discharge patient.  Patient is asymptomatic  *Acute acquired hypothyroidism *History of thyroid cancer, in remission  noted recent increase in thyroid medication earlier this month, TSH normal Outpatient follow-up with endocrinology as recommended    DISCHARGE CONDITIONS:   stable CONSULTS OBTAINED:  Treatment Team:  Teodoro Spray, MD   PROCEDURES  None   DRUG ALLERGIES:   Allergies  Allergen Reactions  . Penicillins Other (See Comments)    Has not had since child, thinks she had reaction to it then    DISCHARGE MEDICATIONS:   Allergies as of 11/21/2018      Reactions   Penicillins Other (See Comments)   Has not had since child, thinks she had reaction to it then      Medication List    TAKE these medications   acetaminophen 325 MG tablet Commonly known as:  TYLENOL Take 2 tablets (650 mg total) by mouth every 6 (six) hours as needed for mild pain or headache.   busPIRone 15 MG tablet Commonly known as:  BUSPAR Take 15 mg by mouth 2 (two) times daily.   eletriptan 40 MG tablet Commonly known as:  RELPAX TAKE 1 TABLET BY MOUTH AS NEEDED FOR MIGRAINE   FLUoxetine 40 MG capsule Commonly known as:  PROZAC Take 40 mg by mouth daily.   hydrOXYzine 25 MG capsule Commonly known as:  VISTARIL   levonorgestrel 20 MCG/24HR IUD Commonly known as:  MIRENA 1 each by Intrauterine route once.   levothyroxine 125 MCG tablet Commonly known as:  SYNTHROID, LEVOTHROID Take 125 mcg by mouth daily before breakfast.   MULTIVITAMIN Liqd Take by mouth.   omeprazole 40 MG capsule Commonly known as:  PRILOSEC Take 1 capsule (40 mg total) daily by mouth. What changed:    when to take this  reasons to take  this   perphenazine 4 MG tablet Commonly known as:  TRILAFON TAKE 1-2 TABLETS AS NEEDED EVERY 2-3 HOURS FOR HEADACHE RESCUE MAX 6 TABS/24 HOURS   rizatriptan 10 MG tablet Commonly known as:  MAXALT Take 10 mg by mouth as needed for migraine. May repeat in 2 hours if needed   triamterene-hydrochlorothiazide 37.5-25 MG tablet Commonly known as:  MAXZIDE-25 TAKE 1 TABLET BY MOUTH EVERY DAY   zonisamide 100 MG capsule Commonly known as:  ZONEGRAN Take 150 mg by mouth as needed (headache).        DISCHARGE INSTRUCTIONS:   Follow-up with primary care physician in 3 to 5 days Follow-up with cardiology-kc in 7 to 10 days and call cardiology office on Monday for outpatient Holter monitor set up  DIET:  Low fat diet  DISCHARGE CONDITION:  Fair  ACTIVITY:  Activity as tolerated  OXYGEN:  Home Oxygen: No.   Oxygen Delivery: room air  DISCHARGE LOCATION:  home   If you experience worsening of your admission symptoms, develop shortness of breath, life threatening emergency, suicidal or homicidal thoughts you must seek medical attention immediately by calling 911 or calling your MD immediately  if symptoms less severe.  You Must read complete instructions/literature along with all the possible adverse reactions/side effects for all the Medicines you take and that have been prescribed to you. Take any new Medicines after you have completely understood and accpet all the possible adverse reactions/side effects.   Please note  You were cared for by a hospitalist during your hospital stay. If you have any questions about your discharge medications or the care you received while you were in the hospital after you are discharged, you can call the unit and asked to speak with the hospitalist on call if the hospitalist that took care of you is not available. Once you are discharged, your primary care physician will handle any further medical issues. Please note that NO REFILLS for any  discharge medications will be authorized once you are discharged, as it is imperative that you return to your primary care physician (or establish a relationship with a primary care physician if you do not have one) for your aftercare needs so that they can reassess your need for medications and monitor your lab values.     Today  Chief Complaint  Patient presents with  . Weakness   Patient denies any chest pain or palpitations.  Wants to go home.  Okay to discharge patient from cardiology standpoint recommending outpatient follow-up and Holter monitoring and outpatient stress test  ROS:  CONSTITUTIONAL: Denies fevers, chills. Denies any fatigue, weakness.  EYES: Denies blurry vision, double vision, eye pain. EARS, NOSE, THROAT: Denies tinnitus, ear pain, hearing loss. RESPIRATORY: Denies cough, wheeze, shortness of breath.  CARDIOVASCULAR: Denies chest pain, palpitations, edema.  GASTROINTESTINAL: Denies nausea, vomiting, diarrhea, abdominal pain. Denies bright red blood per rectum. GENITOURINARY: Denies dysuria, hematuria. ENDOCRINE: Denies nocturia or thyroid problems. HEMATOLOGIC AND LYMPHATIC: Denies easy bruising or bleeding. SKIN: Denies rash or lesion. MUSCULOSKELETAL: Denies pain  in neck, back, shoulder, knees, hips or arthritic symptoms.  NEUROLOGIC: Denies paralysis, paresthesias.  PSYCHIATRIC: Denies anxiety or depressive symptoms.   VITAL SIGNS:  Blood pressure (!) 148/88, pulse (!) 45, temperature 98.5 F (36.9 C), temperature source Oral, resp. rate 19, height 5\' 4"  (1.626 m), weight 97.6 kg, last menstrual period 10/31/2018, SpO2 97 %.  I/O:    Intake/Output Summary (Last 24 hours) at 11/21/2018 1050 Last data filed at 11/21/2018 0400 Gross per 24 hour  Intake 0 ml  Output 1400 ml  Net -1400 ml    PHYSICAL EXAMINATION:  GENERAL:  37 y.o.-year-old patient lying in the bed with no acute distress.  EYES: Pupils equal, round, reactive to light and  accommodation. No scleral icterus. Extraocular muscles intact.  HEENT: Head atraumatic, normocephalic. Oropharynx and nasopharynx clear.  NECK:  Supple, no jugular venous distention. No thyroid enlargement, no tenderness.  LUNGS: Normal breath sounds bilaterally, no wheezing, rales,rhonchi or crepitation. No use of accessory muscles of respiration.  CARDIOVASCULAR: S1, S2 normal. No murmurs, rubs, or gallops.  ABDOMEN: Soft, non-tender, non-distended. Bowel sounds present. No organomegaly or mass.  EXTREMITIES: No pedal edema, cyanosis, or clubbing.  NEUROLOGIC: Cranial nerves II through XII are intact. Muscle strength 5/5 in all extremities. Sensation intact. Gait not checked.  PSYCHIATRIC: The patient is alert and oriented x 3.  SKIN: No obvious rash, lesion, or ulcer.   DATA REVIEW:   CBC Recent Labs  Lab 11/20/18 1650  WBC 6.9  HGB 12.5  HCT 38.0  PLT 207    Chemistries  Recent Labs  Lab 11/20/18 1208 11/20/18 1219 11/20/18 1650  NA 140  --   --   K 3.5  --   --   CL 107  --   --   CO2 26  --   --   GLUCOSE 83  --   --   BUN 11  --   --   CREATININE 1.08*  --  0.89  CALCIUM 8.6*  --   --   MG  --  2.1  --     Cardiac Enzymes Recent Labs  Lab 11/20/18 2247  TROPONINI <0.03    Microbiology Results  Results for orders placed or performed in visit on 09/25/18  STREP GROUP A AG, W/REFLEX TO CULT     Status: None   Collection Time: 09/25/18  2:37 PM  Result Value Ref Range Status   Streptococcus, Group A Screen (Direct) NONE DETECTED  Final  Culture, Group A Strep     Status: None   Collection Time: 09/25/18  2:37 PM  Result Value Ref Range Status   MICRO NUMBER: [id number]  Final   SPECIMEN QUALITY: ADEQUATE  Final   SOURCE: THROAT  Final   STATUS: FINAL  Final   RESULT: No group A Streptococcus isolated  Final    RADIOLOGY:  Ct Angio Chest Pe W And/or Wo Contrast  Result Date: 11/20/2018 CLINICAL DATA:  Chest pain. EXAM: CT ANGIOGRAPHY CHEST WITH  CONTRAST TECHNIQUE: Multidetector CT imaging of the chest was performed using the standard protocol during bolus administration of intravenous contrast. Multiplanar CT image reconstructions and MIPs were obtained to evaluate the vascular anatomy. CONTRAST:  54mL OMNIPAQUE IOHEXOL 350 MG/ML SOLN COMPARISON:  Radiograph of same day. FINDINGS: Cardiovascular: Satisfactory opacification of the pulmonary arteries to the segmental level. No evidence of pulmonary embolism. Normal heart size. No pericardial effusion. Mediastinum/Nodes: No enlarged mediastinal, hilar, or axillary lymph nodes. Thyroid gland, trachea, and esophagus demonstrate  no significant findings. Lungs/Pleura: Lungs are clear. No pleural effusion or pneumothorax. Upper Abdomen: No acute abnormality. Musculoskeletal: No chest wall abnormality. No acute or significant osseous findings. Review of the MIP images confirms the above findings. IMPRESSION: No definite evidence of pulmonary embolus. No definite abnormality seen in the chest. Electronically Signed   By: Marijo Conception, M.D.   On: 11/20/2018 14:39   Dg Chest Port 1 View  Result Date: 11/20/2018 CLINICAL DATA:  Chest pain EXAM: PORTABLE CHEST 1 VIEW COMPARISON:  June 21, 2018. FINDINGS: No edema or consolidation. Heart is upper normal in size with pulmonary vascularity normal. No adenopathy. No pneumothorax. Postoperative changes are noted in the right lower neck and upper thoracic region. No bone lesions. IMPRESSION: Postoperative change on the right. No edema or consolidation. Heart upper normal in size. Electronically Signed   By: Lowella Grip III M.D.   On: 11/20/2018 12:27    EKG:   Orders placed or performed during the hospital encounter of 11/20/18  . ED EKG within 10 minutes  . ED EKG within 10 minutes  . EKG 12-Lead (at 6am)  . EKG 12-Lead (at 6am)      Management plans discussed with the patient, family and they are in agreement.  CODE STATUS:     Code Status  Orders  (From admission, onward)         Start     Ordered   11/20/18 1631  Full code  Continuous     11/20/18 1630        Code Status History    This patient has a current code status but no historical code status.      TOTAL TIME TAKING CARE OF THIS PATIENT: 43  minutes.   Note: This dictation was prepared with Dragon dictation along with smaller phrase technology. Any transcriptional errors that result from this process are unintentional.   @[facility] @  on 11/21/2018 at 10:50 AM  Between 7am to 6pm - Pager - [phone]  After 6pm go to [email or web address] - password [email or web address] [facility]  [facility]  Office  [phone]  CC: Primary care physician; Alycia Rossetti, MD

## 2018-11-25 DIAGNOSIS — R0602 Shortness of breath: Secondary | ICD-10-CM | POA: Diagnosis not present

## 2018-11-25 DIAGNOSIS — R002 Palpitations: Secondary | ICD-10-CM | POA: Diagnosis not present

## 2018-11-25 DIAGNOSIS — R079 Chest pain, unspecified: Secondary | ICD-10-CM | POA: Diagnosis not present

## 2018-12-02 DIAGNOSIS — E890 Postprocedural hypothyroidism: Secondary | ICD-10-CM | POA: Diagnosis not present

## 2018-12-03 DIAGNOSIS — R6889 Other general symptoms and signs: Secondary | ICD-10-CM | POA: Diagnosis not present

## 2018-12-03 DIAGNOSIS — R079 Chest pain, unspecified: Secondary | ICD-10-CM | POA: Diagnosis not present

## 2018-12-03 DIAGNOSIS — R0602 Shortness of breath: Secondary | ICD-10-CM | POA: Diagnosis not present

## 2018-12-06 DIAGNOSIS — C73 Malignant neoplasm of thyroid gland: Secondary | ICD-10-CM | POA: Diagnosis not present

## 2018-12-06 DIAGNOSIS — R0789 Other chest pain: Secondary | ICD-10-CM | POA: Diagnosis not present

## 2018-12-06 DIAGNOSIS — I10 Essential (primary) hypertension: Secondary | ICD-10-CM | POA: Diagnosis not present

## 2018-12-06 DIAGNOSIS — E890 Postprocedural hypothyroidism: Secondary | ICD-10-CM | POA: Diagnosis not present

## 2018-12-06 DIAGNOSIS — E6609 Other obesity due to excess calories: Secondary | ICD-10-CM | POA: Diagnosis not present

## 2018-12-19 ENCOUNTER — Other Ambulatory Visit (HOSPITAL_COMMUNITY): Payer: Self-pay | Admitting: Internal Medicine

## 2018-12-19 DIAGNOSIS — C73 Malignant neoplasm of thyroid gland: Secondary | ICD-10-CM

## 2019-01-03 ENCOUNTER — Ambulatory Visit (INDEPENDENT_AMBULATORY_CARE_PROVIDER_SITE_OTHER): Payer: 59 | Admitting: Family Medicine

## 2019-01-03 ENCOUNTER — Other Ambulatory Visit: Payer: Self-pay

## 2019-01-03 ENCOUNTER — Encounter: Payer: Self-pay | Admitting: Family Medicine

## 2019-01-03 VITALS — BP 130/72 | HR 78 | Temp 97.9°F | Resp 14 | Ht 63.75 in | Wt 213.0 lb

## 2019-01-03 DIAGNOSIS — I10 Essential (primary) hypertension: Secondary | ICD-10-CM | POA: Diagnosis not present

## 2019-01-03 DIAGNOSIS — E890 Postprocedural hypothyroidism: Secondary | ICD-10-CM | POA: Diagnosis not present

## 2019-01-03 DIAGNOSIS — Z23 Encounter for immunization: Secondary | ICD-10-CM | POA: Diagnosis not present

## 2019-01-03 DIAGNOSIS — Z1239 Encounter for other screening for malignant neoplasm of breast: Secondary | ICD-10-CM | POA: Diagnosis not present

## 2019-01-03 DIAGNOSIS — R7303 Prediabetes: Secondary | ICD-10-CM

## 2019-01-03 DIAGNOSIS — Z0000 Encounter for general adult medical examination without abnormal findings: Secondary | ICD-10-CM | POA: Diagnosis not present

## 2019-01-03 DIAGNOSIS — E66812 Obesity, class 2: Secondary | ICD-10-CM

## 2019-01-03 DIAGNOSIS — Z20828 Contact with and (suspected) exposure to other viral communicable diseases: Secondary | ICD-10-CM

## 2019-01-03 DIAGNOSIS — Z6836 Body mass index (BMI) 36.0-36.9, adult: Secondary | ICD-10-CM

## 2019-01-03 MED ORDER — OSELTAMIVIR PHOSPHATE 75 MG PO CAPS: 75.0000 mg | ORAL_CAPSULE | Freq: Every day | ORAL | 0 refills | Status: DC

## 2019-01-03 MED ORDER — LEVOTHYROXINE SODIUM 150 MCG PO TABS: 150.0000 ug | ORAL_TABLET | Freq: Every day | ORAL | Status: DC

## 2019-01-03 NOTE — Progress Notes (Signed)
Subjective:    Patient ID: Alexandria [name], female    DOB: 01-06-1981, 38 y.o.   MRN: [id number]  Patient presents for Annual Exam (is fasting- has GYN)  Pt here for CPE, medications and history reviewed    She was seen by cardiology in Dec, due to chest pain and palpitations and she was off of blood pressure medications. She now has a HR monitor. She does have spells when she is anxious, but states when she exerts herself her heart rate goes down? To 60's  She had a holter monitor that came back normal   Hypothyroidism- Dr. KerrEndocrinology-  She was changed to 128mcg  In December based on symptoms and this has helped, her labs were normal on the 155mcg ,  energy levels have improved and no longer having the cardiac symptoms   Migraine- Dr. Domingo Cocking uses relpax,, has not zonegran   GYN- UTD, has had PAP [place]   Obesity- started drinking water, also takes detox teas    Due for TDAP /flu      Mother has the flu , she has been caring for her, wants flu shot and prophylaxis, she has not symptoms    Review Of Systems:  GEN- denies fatigue, fever, weight loss,weakness, recent illness HEENT- denies eye drainage, change in vision, nasal discharge, CVS- denies chest pain, palpitations RESP- denies SOB, cough, wheeze ABD- denies N/V, change in stools, abd pain GU- denies dysuria, hematuria, dribbling, incontinence MSK- denies joint pain, muscle aches, injury Neuro- denies headache, dizziness, syncope, seizure activity       Objective:    BP 130/72   Pulse 78   Temp 97.9 F (36.6 C) (Oral)   Resp 14   Ht 5' 3.75" (1.619 m)   Wt 213 lb (96.6 kg)   SpO2 100%   BMI 36.85 kg/m  GEN- NAD, alert and oriented x3 HEENT- PERRL, EOMI, non injected sclera, pink conjunctiva, MMM, oropharynx clear Neck- Supple, no LAD CVS- RRR, no murmur RESP-CTAB ABD-NABS,soft,NT,ND Psych- normal affect and mood  EXT- No edema Pulses- Radial, DP- 2+        Assessment & Plan:       Problem List Items Addressed This Visit      Unprioritized   Hypertension    Blood pressure is controlled now that she is back on her medications.  We will check her fasting labs today including cholesterol.  We discussed dietary changes and exercise now that she is feeling back to her baseline.      Relevant Medications   losartan (COZAAR) 25 MG tablet   Obesity    Per above for dietary changes.      Postsurgical hypothyroidism    Recent adjustment by her endocrinologist.  She states that her symptoms have improved she feels great energy is up      Relevant Medications   levothyroxine (SYNTHROID, LEVOTHROID) 150 MCG tablet   Prediabetes   Relevant Orders   Hemoglobin A1c    Other Visit Diagnoses    Routine general medical examination at a health care facility    -  Primary   cpe DONE , patient to get mammogram after her 67th birthday.  She will follow-up with her GYN for her Pap smear Tdap and flu given   Relevant Orders   Comprehensive metabolic panel   CBC with Differential/Platelet (Completed)   Lipid panel   Tdap vaccine greater than or equal to 7yo IM (Completed)   Breast cancer screening  Relevant Orders   MM 3D SCREEN BREAST BILATERAL   Need for immunization against influenza       Relevant Orders   Flu Vaccine QUAD 36+ mos IM (Completed)   Need for tetanus, diphtheria, and acellular pertussis (Tdap) vaccine in patient of adolescent age or older       Relevant Orders   Tdap vaccine greater than or equal to 7yo IM (Completed)   Exposure to the flu       No symptoms, 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 Any transcriptional errors that result from this process are unintentional.

## 2019-01-03 NOTE — Patient Instructions (Signed)
I recommend eye visit once a year I recommend dental visit every 6 months Goal is to  Exercise 30 minutes 5 days a week We will call with lab results  F/U 6 months

## 2019-01-03 NOTE — Assessment & Plan Note (Addendum)
Recent adjustment by her endocrinologist.  She states that her symptoms have improved she feels great energy is up

## 2019-01-03 NOTE — Assessment & Plan Note (Signed)
Blood pressure is controlled now that she is back on her medications.  We will check her fasting labs today including cholesterol.  We discussed dietary changes and exercise now that she is feeling back to her baseline.

## 2019-01-03 NOTE — Assessment & Plan Note (Signed)
Per above for dietary changes.

## 2019-01-04 LAB — CBC WITH DIFFERENTIAL/PLATELET
Absolute Monocytes: 480 cells/uL (ref 200–950)
Basophils Absolute: 40 cells/uL (ref 0–200)
Basophils Relative: 0.8 %
Eosinophils Absolute: 80 cells/uL (ref 15–500)
Eosinophils Relative: 1.6 %
HCT: 39.2 % (ref 35.0–45.0)
Hemoglobin: 13.1 g/dL (ref 11.7–15.5)
Lymphs Abs: 1645 cells/uL (ref 850–3900)
MCH: 28.5 pg (ref 27.0–33.0)
MCHC: 33.4 g/dL (ref 32.0–36.0)
MCV: 85.4 fL (ref 80.0–100.0)
MPV: 10.9 fL (ref 7.5–12.5)
Monocytes Relative: 9.6 %
Neutro Abs: 2755 cells/uL (ref 1500–7800)
Neutrophils Relative %: 55.1 %
Platelets: 295 10*3/uL (ref 140–400)
RBC: 4.59 10*6/uL (ref 3.80–5.10)
RDW: 11.8 % (ref 11.0–15.0)
Total Lymphocyte: 32.9 %
WBC: 5 10*3/uL (ref 3.8–10.8)

## 2019-01-04 LAB — COMPREHENSIVE METABOLIC PANEL
AG Ratio: 1.4 (calc) (ref 1.0–2.5)
ALT: 16 U/L (ref 6–29)
AST: 19 U/L (ref 10–30)
Albumin: 4 g/dL (ref 3.6–5.1)
Alkaline phosphatase (APISO): 75 U/L (ref 33–115)
BUN: 9 mg/dL (ref 7–25)
CO2: 28 mmol/L (ref 20–32)
Calcium: 9.4 mg/dL (ref 8.6–10.2)
Chloride: 104 mmol/L (ref 98–110)
Creat: 1.08 mg/dL (ref 0.50–1.10)
Globulin: 2.8 g/dL (calc) (ref 1.9–3.7)
Glucose, Bld: 87 mg/dL (ref 65–99)
Potassium: 4 mmol/L (ref 3.5–5.3)
Sodium: 139 mmol/L (ref 135–146)
Total Bilirubin: 0.4 mg/dL (ref 0.2–1.2)
Total Protein: 6.8 g/dL (ref 6.1–8.1)

## 2019-01-04 LAB — LIPID PANEL
Cholesterol: 128 mg/dL (ref <200)
HDL: 50 mg/dL — ABNORMAL LOW (ref >50)
LDL Cholesterol (Calc): 64 mg/dL (calc)
Non-HDL Cholesterol (Calc): 78 mg/dL (calc) (ref <130)
Total CHOL/HDL Ratio: 2.6 (calc) (ref <5.0)
Triglycerides: 48 mg/dL (ref <150)

## 2019-01-04 LAB — HEMOGLOBIN A1C
Hgb A1c MFr Bld: 5.5 % of total Hgb (ref <5.7)
Mean Plasma Glucose: 111 (calc)
eAG (mmol/L): 6.2 (calc)

## 2019-01-09 ENCOUNTER — Encounter: Payer: Self-pay | Admitting: *Deleted

## 2019-01-11 ENCOUNTER — Encounter: Payer: Self-pay | Admitting: *Deleted

## 2019-01-13 ENCOUNTER — Encounter (HOSPITAL_COMMUNITY)
Admission: RE | Admit: 2019-01-13 | Discharge: 2019-01-13 | Disposition: A | Discharge status: Home / Self Care | Payer: 59 | Source: Ambulatory Visit | Attending: Internal Medicine | Admitting: Internal Medicine

## 2019-01-13 DIAGNOSIS — C73 Malignant neoplasm of thyroid gland: Secondary | ICD-10-CM | POA: Insufficient documentation

## 2019-01-13 MED ORDER — STERILE WATER FOR INJECTION IJ SOLN
INTRAMUSCULAR | Status: AC
  Filled 2019-01-13: qty 20

## 2019-01-13 MED ORDER — THYROTROPIN ALFA 1.1 MG IM SOLR
0.9000 mg | INTRAMUSCULAR | Status: AC
  Administered 2019-01-13: 0.9 mg via INTRAMUSCULAR

## 2019-01-14 ENCOUNTER — Ambulatory Visit (HOSPITAL_COMMUNITY)
Admission: RE | Admit: 2019-01-14 | Discharge: 2019-01-14 | Disposition: A | Discharge status: Home / Self Care | Payer: 59 | Source: Ambulatory Visit | Attending: Internal Medicine | Admitting: Internal Medicine

## 2019-01-14 DIAGNOSIS — C73 Malignant neoplasm of thyroid gland: Secondary | ICD-10-CM | POA: Diagnosis not present

## 2019-01-14 MED ORDER — THYROTROPIN ALFA 1.1 MG IM SOLR
0.9000 mg | INTRAMUSCULAR | Status: AC
  Administered 2019-01-14: 0.9 mg via INTRAMUSCULAR

## 2019-01-14 MED ORDER — STERILE WATER FOR INJECTION IJ SOLN
INTRAMUSCULAR | Status: AC
  Filled 2019-01-14: qty 10

## 2019-01-14 MED ORDER — STERILE WATER FOR INJECTION IJ SOLN: 1.0000 mL | Freq: Once | INTRAMUSCULAR | Status: DC

## 2019-01-14 MED ORDER — THYROTROPIN ALFA 1.1 MG IM SOLR: 0.9000 mg | INTRAMUSCULAR | Status: DC

## 2019-01-15 ENCOUNTER — Encounter (HOSPITAL_COMMUNITY)
Admission: RE | Admit: 2019-01-15 | Discharge: 2019-01-15 | Disposition: A | Discharge status: Home / Self Care | Payer: 59 | Source: Ambulatory Visit | Attending: Internal Medicine | Admitting: Internal Medicine

## 2019-01-15 DIAGNOSIS — C73 Malignant neoplasm of thyroid gland: Secondary | ICD-10-CM | POA: Diagnosis present

## 2019-01-15 LAB — HCG, SERUM, QUALITATIVE: Preg, Serum: NEGATIVE

## 2019-01-15 MED ORDER — SODIUM IODIDE I 131 CAPSULE
4.0000 | Freq: Once | INTRAVENOUS | Status: AC | PRN
  Administered 2019-01-15: 4 via ORAL

## 2019-01-17 ENCOUNTER — Encounter (HOSPITAL_COMMUNITY)
Admission: RE | Admit: 2019-01-17 | Discharge: 2019-01-17 | Disposition: A | Discharge status: Home / Self Care | Payer: 59 | Source: Ambulatory Visit | Attending: Internal Medicine | Admitting: Internal Medicine

## 2019-01-17 DIAGNOSIS — C73 Malignant neoplasm of thyroid gland: Secondary | ICD-10-CM | POA: Insufficient documentation

## 2019-01-17 DIAGNOSIS — E890 Postprocedural hypothyroidism: Secondary | ICD-10-CM | POA: Diagnosis not present

## 2019-01-17 MED ORDER — SODIUM IODIDE I 131 CAPSULE: 3067.0000 | Freq: Once | INTRAVENOUS | Status: DC | PRN

## 2019-01-17 MED ORDER — SODIUM IODIDE I 131 CAPSULE
3.6700 | Freq: Once | INTRAVENOUS | Status: AC | PRN
  Administered 2019-01-16: 3.67 via ORAL

## 2019-01-18 LAB — TGAB+THYROGLOBULIN IMA OR LCMS: Thyroglobulin Antibody: <1 IU/mL (ref 0.0–0.9)

## 2019-01-18 LAB — THYROGLOBULIN BY IMA: Thyroglobulin by IMA: 13.4 ng/mL (ref 1.5–38.5)

## 2019-01-23 DIAGNOSIS — E890 Postprocedural hypothyroidism: Secondary | ICD-10-CM | POA: Diagnosis not present

## 2019-01-27 ENCOUNTER — Other Ambulatory Visit: Payer: Self-pay | Admitting: Internal Medicine

## 2019-01-27 DIAGNOSIS — E890 Postprocedural hypothyroidism: Secondary | ICD-10-CM | POA: Diagnosis not present

## 2019-01-27 DIAGNOSIS — C73 Malignant neoplasm of thyroid gland: Secondary | ICD-10-CM | POA: Diagnosis not present

## 2019-01-30 ENCOUNTER — Inpatient Hospital Stay: Admission: RE | Admit: 2019-01-30 | Payer: 59 | Source: Ambulatory Visit

## 2019-01-31 ENCOUNTER — Ambulatory Visit
Admission: RE | Admit: 2019-01-31 | Discharge: 2019-01-31 | Disposition: A | Discharge status: Home / Self Care | Payer: 59 | Source: Ambulatory Visit | Attending: Internal Medicine | Admitting: Internal Medicine

## 2019-01-31 ENCOUNTER — Encounter: Payer: Self-pay | Admitting: Radiology

## 2019-01-31 DIAGNOSIS — R946 Abnormal results of thyroid function studies: Secondary | ICD-10-CM | POA: Diagnosis not present

## 2019-01-31 DIAGNOSIS — C73 Malignant neoplasm of thyroid gland: Secondary | ICD-10-CM

## 2019-01-31 MED ORDER — IOPAMIDOL (ISOVUE-300) INJECTION 61%
100.0000 mL | Freq: Once | INTRAVENOUS | Status: AC | PRN
  Administered 2019-01-31: 100 mL via INTRAVENOUS

## 2019-02-05 ENCOUNTER — Other Ambulatory Visit: Payer: Self-pay | Admitting: Internal Medicine

## 2019-02-05 ENCOUNTER — Other Ambulatory Visit: Payer: Self-pay | Admitting: Family Medicine

## 2019-02-05 DIAGNOSIS — C73 Malignant neoplasm of thyroid gland: Secondary | ICD-10-CM

## 2019-02-05 DIAGNOSIS — E890 Postprocedural hypothyroidism: Secondary | ICD-10-CM | POA: Diagnosis not present

## 2019-02-06 ENCOUNTER — Other Ambulatory Visit: Payer: Self-pay | Admitting: Internal Medicine

## 2019-02-06 DIAGNOSIS — C73 Malignant neoplasm of thyroid gland: Secondary | ICD-10-CM

## 2019-02-07 ENCOUNTER — Other Ambulatory Visit: Payer: 59

## 2019-02-10 ENCOUNTER — Other Ambulatory Visit: Payer: 59

## 2019-02-13 ENCOUNTER — Other Ambulatory Visit: Payer: Self-pay | Admitting: Internal Medicine

## 2019-02-19 ENCOUNTER — Other Ambulatory Visit (HOSPITAL_COMMUNITY): Payer: Self-pay | Admitting: Internal Medicine

## 2019-02-19 ENCOUNTER — Other Ambulatory Visit: Payer: 59

## 2019-02-19 DIAGNOSIS — C73 Malignant neoplasm of thyroid gland: Secondary | ICD-10-CM

## 2019-02-20 ENCOUNTER — Other Ambulatory Visit (HOSPITAL_COMMUNITY): Payer: Self-pay | Admitting: Internal Medicine

## 2019-02-26 ENCOUNTER — Ambulatory Visit (HOSPITAL_COMMUNITY): Payer: 59

## 2019-02-26 ENCOUNTER — Ambulatory Visit: Payer: 59 | Admitting: Internal Medicine

## 2019-03-04 ENCOUNTER — Other Ambulatory Visit: Payer: Self-pay | Admitting: Student

## 2019-03-05 ENCOUNTER — Other Ambulatory Visit (HOSPITAL_COMMUNITY): Payer: Self-pay | Admitting: Internal Medicine

## 2019-03-05 ENCOUNTER — Ambulatory Visit (HOSPITAL_COMMUNITY)
Admission: RE | Admit: 2019-03-05 | Discharge: 2019-03-05 | Disposition: A | Discharge status: Home / Self Care | Payer: 59 | Source: Ambulatory Visit | Attending: Internal Medicine | Admitting: Internal Medicine

## 2019-03-05 ENCOUNTER — Other Ambulatory Visit (HOSPITAL_COMMUNITY): Payer: Self-pay | Admitting: Interventional Radiology

## 2019-03-05 ENCOUNTER — Other Ambulatory Visit: Payer: Self-pay

## 2019-03-05 DIAGNOSIS — Z9089 Acquired absence of other organs: Secondary | ICD-10-CM | POA: Diagnosis not present

## 2019-03-05 DIAGNOSIS — Z8249 Family history of ischemic heart disease and other diseases of the circulatory system: Secondary | ICD-10-CM | POA: Insufficient documentation

## 2019-03-05 DIAGNOSIS — R590 Localized enlarged lymph nodes: Secondary | ICD-10-CM | POA: Diagnosis not present

## 2019-03-05 DIAGNOSIS — Z809 Family history of malignant neoplasm, unspecified: Secondary | ICD-10-CM | POA: Insufficient documentation

## 2019-03-05 DIAGNOSIS — I10 Essential (primary) hypertension: Secondary | ICD-10-CM | POA: Insufficient documentation

## 2019-03-05 DIAGNOSIS — C73 Malignant neoplasm of thyroid gland: Secondary | ICD-10-CM

## 2019-03-05 DIAGNOSIS — Z793 Long term (current) use of hormonal contraceptives: Secondary | ICD-10-CM | POA: Diagnosis not present

## 2019-03-05 DIAGNOSIS — E041 Nontoxic single thyroid nodule: Secondary | ICD-10-CM | POA: Diagnosis not present

## 2019-03-05 DIAGNOSIS — Z79899 Other long term (current) drug therapy: Secondary | ICD-10-CM | POA: Insufficient documentation

## 2019-03-05 DIAGNOSIS — Z79890 Hormone replacement therapy (postmenopausal): Secondary | ICD-10-CM | POA: Diagnosis not present

## 2019-03-05 DIAGNOSIS — Z85850 Personal history of malignant neoplasm of thyroid: Secondary | ICD-10-CM | POA: Diagnosis not present

## 2019-03-05 LAB — CBC
HCT: 39.8 % (ref 36.0–46.0)
Hemoglobin: 12.6 g/dL (ref 12.0–15.0)
MCH: 27.5 pg (ref 26.0–34.0)
MCHC: 31.7 g/dL (ref 30.0–36.0)
MCV: 86.7 fL (ref 80.0–100.0)
Platelets: 293 10*3/uL (ref 150–400)
RBC: 4.59 MIL/uL (ref 3.87–5.11)
RDW: 12.3 % (ref 11.5–15.5)
WBC: 6.2 10*3/uL (ref 4.0–10.5)
nRBC: 0 % (ref 0.0–0.2)

## 2019-03-05 LAB — PROTIME-INR
INR: 1.1 (ref 0.8–1.2)
Prothrombin Time: 13.6 seconds (ref 11.4–15.2)

## 2019-03-05 LAB — APTT: aPTT: 31 seconds (ref 24–36)

## 2019-03-05 MED ORDER — FENTANYL CITRATE (PF) 100 MCG/2ML IJ SOLN
INTRAMUSCULAR | Status: AC
  Filled 2019-03-05: qty 2

## 2019-03-05 MED ORDER — MIDAZOLAM HCL 2 MG/2ML IJ SOLN
INTRAMUSCULAR | Status: DC | PRN
  Administered 2019-03-05 (×3): 1 mg via INTRAVENOUS

## 2019-03-05 MED ORDER — SODIUM CHLORIDE 0.9 % IV SOLN: INTRAVENOUS | Status: DC

## 2019-03-05 MED ORDER — MIDAZOLAM HCL 2 MG/2ML IJ SOLN
INTRAMUSCULAR | Status: AC
  Filled 2019-03-05: qty 2

## 2019-03-05 MED ORDER — LIDOCAINE-EPINEPHRINE 1 %-1:100000 IJ SOLN
INTRAMUSCULAR | Status: AC
  Filled 2019-03-05: qty 1

## 2019-03-05 MED ORDER — FENTANYL CITRATE (PF) 100 MCG/2ML IJ SOLN
INTRAMUSCULAR | Status: DC | PRN
  Administered 2019-03-05: 25 ug via INTRAVENOUS
  Administered 2019-03-05: 50 ug via INTRAVENOUS
  Administered 2019-03-05: 25 ug via INTRAVENOUS

## 2019-03-05 NOTE — Discharge Instructions (Signed)
Needle Biopsy, Care After These instructions tell you how to care for yourself after your procedure. Your doctor may also give you more specific instructions. Call your doctor if you have any problems or questions. What can I expect after the procedure? After the procedure, it is common to have:  Soreness.  Bruising.  Mild pain. Follow these instructions at home:   Return to your normal activities as told by your doctor. Ask your doctor what activities are safe for you.  Take over-the-counter and prescription medicines only as told by your doctor.  Wash your hands with soap and water before you change your bandage (dressing). If you cannot use soap and water, use hand sanitizer.  Follow instructions from your doctor about: ? When to remove your bandage. In 24 hours  Check your puncture site every day for signs of infection. Watch for: ? Redness, swelling, or pain. ? Fluid or blood. ? Pus or a bad smell. ? Warmth.  Do not take baths, swim, or use a hot tub until your doctor approves. Ask your doctor if you may take showers. You may only be allowed to take sponge baths.  Keep all follow-up visits as told by your doctor. This is important. Contact a doctor if you have:  A fever.  Redness, swelling, or pain at the puncture site, and it lasts longer than a few days.  Fluid, blood, or pus coming from the puncture site.  Warmth coming from the puncture site. Get help right away if:  You have a lot of bleeding from the puncture site. Summary  After the procedure, it is common to have soreness, bruising, or mild pain at the puncture site.  Check your puncture site every day for signs of infection, such as redness, swelling, or pain.  Get help right away if you have severe bleeding from your puncture site. This information is not intended to replace advice given to you by your health care provider. Make sure you discuss any questions you have with your health care  provider. Document Released: 11/23/2008 Document Revised: 12/24/2017 Document Reviewed: 12/24/2017 Elsevier Interactive Patient Education  2019 [employer].  Moderate Conscious Sedation, Adult, Care After These instructions provide you with information about caring for yourself after your procedure. Your health care provider may also give you more specific instructions. Your treatment has been planned according to current medical practices, but problems sometimes occur. Call your health care provider if you have any problems or questions after your procedure. What can I expect after the procedure? After your procedure, it is common:  To feel sleepy for several hours.  To feel clumsy and have poor balance for several hours.  To have poor judgment for several hours.  To vomit if you eat too soon. Follow these instructions at home: For at least 24 hours after the procedure:   Do not: ? Participate in activities where you could fall or become injured. ? Drive. ? Use heavy machinery. ? Drink alcohol. ? Take sleeping pills or medicines that cause drowsiness. ? Make important decisions or sign legal documents. ? Take care of children on your own.  Rest. Eating and drinking  Follow the diet recommended by your health care provider.  If you vomit: ? Drink water, juice, or soup when you can drink without vomiting. ? Make sure you have Tiffany or no nausea before eating solid foods. General instructions  Have a responsible adult stay with you until you are awake and alert.  Take over-the-counter and prescription  medicines only as told by your health care provider.  If you smoke, do not smoke without supervision.  Keep all follow-up visits as told by your health care provider. This is important. Contact a health care provider if:  You keep feeling nauseous or you keep vomiting.  You feel light-headed.  You develop a rash.  You have a fever. Get help right away if:  You  have trouble breathing. This information is not intended to replace advice given to you by your health care provider. Make sure you discuss any questions you have with your health care provider. Document Released: 10/01/2013 Document Revised: 05/15/2016 Document Reviewed: 04/01/2016 Elsevier Interactive Patient Education  2019 [employer].

## 2019-03-05 NOTE — Discharge Instructions (Signed)
Needle Biopsy, Care After These instructions tell you how to care for yourself after your procedure. Your doctor may also give you more specific instructions. Call your doctor if you have any problems or questions. What can I expect after the procedure? After the procedure, it is common to have:  Soreness.  Bruising.  Mild pain. Follow these instructions at home:   Return to your normal activities as told by your doctor. Ask your doctor what activities are safe for you.  Take over-the-counter and prescription medicines only as told by your doctor.  Wash your hands with soap and water before you change your bandage (dressing). If you cannot use soap and water, use hand sanitizer.  Follow instructions from your doctor about: ? When to remove your bandage. In 24-48 hours.  Check your puncture site every day for signs of infection. Watch for: ? Redness, swelling, or pain. ? Fluid or blood. ? Pus or a bad smell. ? Warmth.  Do not take baths, swim, or use a hot tub until your doctor approves. Ask your doctor if you may take showers. You may only be allowed to take sponge baths.  Keep all follow-up visits as told by your doctor. This is important. Contact a doctor if you have:  A fever.  Redness, swelling, or pain at the puncture site, and it lasts longer than a few days.  Fluid, blood, or pus coming from the puncture site.  Warmth coming from the puncture site. Get help right away if:  You have a lot of bleeding from the puncture site. Summary  After the procedure, it is common to have soreness, bruising, or mild pain at the puncture site.  Check your puncture site every day for signs of infection, such as redness, swelling, or pain.  Get help right away if you have severe bleeding from your puncture site. This information is not intended to replace advice given to you by your health care provider. Make sure you discuss any questions you have with your health care  provider. Document Released: 11/23/2008 Document Revised: 12/24/2017 Document Reviewed: 12/24/2017 Elsevier Interactive Patient Education  2019 [place].  Moderate Conscious Sedation, Adult, Care After These instructions provide you with information about caring for yourself after your procedure. Your health care provider may also give you more specific instructions. Your treatment has been planned according to current medical practices, but problems sometimes occur. Call your health care provider if you have any problems or questions after your procedure. What can I expect after the procedure? After your procedure, it is common:  To feel sleepy for several hours.  To feel clumsy and have poor balance for several hours.  To have poor judgment for several hours.  To vomit if you eat too soon. Follow these instructions at home: For at least 24 hours after the procedure:   Do not: ? Participate in activities where you could fall or become injured. ? Drive. ? Use heavy machinery. ? Drink alcohol. ? Take sleeping pills or medicines that cause drowsiness. ? Make important decisions or sign legal documents. ? Take care of children on your own.  Rest. Eating and drinking  Follow the diet recommended by your health care provider.  If you vomit: ? Drink water, juice, or soup when you can drink without vomiting. ? Make sure you have Rauh or no nausea before eating solid foods. General instructions  Have a responsible adult stay with you until you are awake and alert.  Take over-the-counter and prescription  medicines only as told by your health care provider.  If you smoke, do not smoke without supervision.  Keep all follow-up visits as told by your health care provider. This is important. Contact a health care provider if:  You keep feeling nauseous or you keep vomiting.  You feel light-headed.  You develop a rash.  You have a fever. Get help right away if:  You  have trouble breathing. This information is not intended to replace advice given to you by your health care provider. Make sure you discuss any questions you have with your health care provider. Document Released: 10/01/2013 Document Revised: 05/15/2016 Document Reviewed: 04/01/2016 Elsevier Interactive Patient Education  2019 [employer].

## 2019-03-05 NOTE — Procedures (Signed)
Pre Procedure Dx: Thyroid cancer, concern for recurrence Post Procedural Dx: Same  Technically successful US guided biopsy of indeterminate nodule within the right thyroidectomy bed. Technically successful US guided biopsy of indeterminate lymph node within the right thyroidectomy bed. Technically successful US guided biopsy of indeterminate lymph node within the right inferior lateral neck.  EBL: None  No immediate complications.   Ronny Bacon, MD Pager #: [phone]

## 2019-03-05 NOTE — Progress Notes (Signed)
Chief Complaint: Patient was seen in consultation today for lymphadenopathy  Referring Physician(s): Kerr,Jeffrey  Supervising Physician: Sandi Mariscal  Patient Status: [facility] - Out-pt  History of Present Illness: Alexandria [name] is a 38 y.o. female with past medical history of HTN and papillary thyroid cancer s/p thyroidectomy presents to radiology for possible lymph node biopsy, possible thyroid bed fine need aspiration at the request of Dr. Buddy Duty.  Case reviewed and approved by Dr. Earleen Newport.  Patient has been NPO.  She does not take blood thinners.   Past Medical History:  Diagnosis Date  . Allergy   . Diverticulitis   . Hypertension   . Migraines    Neurology- Dr. [name] Alar  . Ovarian cyst   . Thyroid cancer [facility])     Past Surgical History:  Procedure Laterality Date  . GALLBLADDER SURGERY    . left leg surgery    . parathyroid gland    . thyroid removed      Allergies: Penicillins  Medications: Prior to Admission medications   Medication Sig Start Date End Date Taking? Authorizing Provider  Cholecalciferol (VITAMIN D3 GUMMIES ADULT PO) Take 2 each by mouth.   Yes [provider]  levothyroxine (SYNTHROID, LEVOTHROID) 150 MCG tablet Take 1 tablet (150 mcg total) by mouth daily before breakfast. 01/03/19  Yes Kalifornsky, Modena Nunnery, MD  losartan (COZAAR) 25 MG tablet Take 25 mg by mouth daily.   Yes [provider]  Multiple Vitamins-Minerals (MULTIVITAMIN) LIQD Take by mouth.   Yes [provider]  OVER THE COUNTER MEDICATION Hemp extract for sleep PRN   Yes [provider]  OVER THE COUNTER MEDICATION inonotus obliquus mushroom extract- 2 daily for immune system   Yes [provider]  triamterene-hydrochlorothiazide (MAXZIDE-25) 37.5-25 MG tablet TAKE 1 TABLET BY MOUTH EVERY DAY 02/05/19  Yes [place], Modena Nunnery, MD  acetaminophen (TYLENOL) 325 MG tablet Take 2 tablets (650 mg total) by mouth every 6 (six) hours as needed for mild  pain or headache. 11/21/18   Gouru, Illene Silver, MD  eletriptan (RELPAX) 40 MG tablet TAKE 1 TABLET BY MOUTH AS NEEDED FOR MIGRAINE 08/16/18   [provider]  levonorgestrel (MIRENA) 20 MCG/24HR IUD 1 each by Intrauterine route once.    [provider]  omeprazole (PRILOSEC) 40 MG capsule Take 1 capsule (40 mg total) daily by mouth. Patient taking differently: Take 40 mg by mouth daily as needed.  10/30/17   Alycia Rossetti, MD  oseltamivir (TAMIFLU) 75 MG capsule Take 1 capsule (75 mg total) by mouth daily. 01/03/19   Clayton, Modena Nunnery, MD  OVER THE COUNTER MEDICATION chrom pico/brindal berry (GARCINIA CAMBOGIA ORAL)    [provider]  perphenazine (TRILAFON) 4 MG tablet TAKE 1-2 TABLETS AS NEEDED EVERY 2-3 HOURS FOR HEADACHE RESCUE MAX 6 TABS/24 HOURS 06/03/18   [provider]  rizatriptan (MAXALT) 10 MG tablet Take 10 mg by mouth as needed for migraine. May repeat in 2 hours if needed    [provider]  zonisamide (ZONEGRAN) 50 MG capsule Take 3 capsules (150 mg total) by mouth daily. 01/03/19   Alycia Rossetti, MD     Family History  Problem Relation Age of Onset  . Hypertension Mother   . Diabetes Mother   . Hypertension Paternal Aunt   . Hypertension Paternal Uncle   . Hypertension Maternal Grandmother   . Cancer Maternal Grandfather   . Diabetes Maternal Grandfather   . Parkinsonism Maternal Grandfather   .  Alzheimer's disease Maternal Grandfather   . Liver disease Sister     Social History   Socioeconomic History  . Marital status: Married    Spouse name: Not on file  . Number of children: Not on file  . Years of education: Not on file  . Highest education level: Not on file  Occupational History  . Not on file  Social Needs  . Financial resource strain: Not on file  . Food insecurity:    Worry: Not on file    Inability: Not on file  . Transportation needs:    Medical: Not on file    Non-medical: Not on file  Tobacco Use   . Smoking status: Never Smoker  . Smokeless tobacco: Never Used  Substance and Sexual Activity  . Alcohol use: No  . Drug use: No  . Sexual activity: Yes    Birth control/protection: I.U.D.  Lifestyle  . Physical activity:    Days per week: Not on file    Minutes per session: Not on file  . Stress: Not on file  Relationships  . Social connections:    Talks on phone: Not on file    Gets together: Not on file    Attends religious service: Not on file    Active member of club or organization: Not on file    Attends meetings of clubs or organizations: Not on file    Relationship status: Not on file  Other Topics Concern  . Not on file  Social History Narrative  . Not on file     Review of Systems: A 12 point ROS discussed and pertinent positives are indicated in the HPI above.  All other systems are negative.  Review of Systems  Constitutional: Negative for fatigue and fever.  Respiratory: Negative for cough and shortness of breath.   Cardiovascular: Negative for chest pain.  Gastrointestinal: Negative for abdominal pain.  Musculoskeletal: Negative for back pain.  Psychiatric/Behavioral: Negative for behavioral problems and confusion.    Vital Signs: BP (!) 133/93   Pulse 98   Temp 98.1 F (36.7 C) (Oral)   Resp 16   Ht 5\' 4"  (1.626 m)   Wt 215 lb (97.5 kg)   SpO2 95%   BMI 36.90 kg/m   Physical Exam Vitals signs and nursing note reviewed.  Constitutional:      Appearance: Normal appearance.  Neck:     Musculoskeletal: Normal range of motion and neck supple.     Comments: No palpable lymphadenopathy Cardiovascular:     Rate and Rhythm: Normal rate and regular rhythm.  Pulmonary:     Effort: Pulmonary effort is normal. No respiratory distress.     Breath sounds: Normal breath sounds.  Skin:    General: Skin is warm and dry.  Neurological:     General: No focal deficit present.     Mental Status: She is alert and oriented to person, place, and time.   Psychiatric:        Mood and Affect: Mood normal.        Behavior: Behavior normal.        Thought Content: Thought content normal.        Judgment: Judgment normal.     MD Evaluation Airway: WNL Heart: WNL Abdomen: WNL Chest/ Lungs: WNL ASA  Classification: 3 Mallampati/Airway Score: One   Imaging: No results found.  Labs:  CBC: Recent Labs    11/20/18 1208 11/20/18 1650 01/03/19 0914 03/05/19 1132  WBC 6.0 6.9 5.0  6.2  HGB 12.8 12.5 13.1 12.6  HCT 39.1 38.0 39.2 39.8  PLT 225 207 295 293    COAGS: Recent Labs    11/20/18 1219 03/05/19 1132  INR 0.91 1.1  APTT 28 31    BMP: Recent Labs    09/25/18 1307 10/07/18 1433 11/20/18 1208 11/20/18 1650 01/03/19 0914  NA 137 135 140  --  139  K 3.7 4.1 3.5  --  4.0  CL 103 102 107  --  104  CO2 27 26 26   --  28  GLUCOSE 75 105* 83  --  87  BUN 11 18 11   --  9  CALCIUM 9.0 9.4 8.6*  --  9.4  CREATININE 1.27* 1.22* 1.08* 0.89 1.08  GFRNONAA 53* 56* >60 >60  --   GFRAA 62 65 >60 >60  --     LIVER FUNCTION TESTS: Recent Labs    09/25/18 1307 01/03/19 0914  BILITOT 0.3 0.4  AST 20 19  ALT 21 16  PROT 6.6 6.8    TUMOR MARKERS: No results for input(s): AFPTM, CEA, CA199, CHROMGRNA in the last 8760 hours.  Assessment and Plan: Patient with past medical history of papillary thyroid cancer presents with complaint of lymphadenopathy.  IR consulted for lymph node biopsy, thyroid bed biopsy at the request of Dr. Buddy Duty. Case reviewed by Dr. Earleen Newport who approves patient for procedure.  Patient presents today in their usual state of health.  She has been NPO and is not currently on blood thinners.  Discussed with Dr. Pascal Lux.  Will plan to proceed with pre-procedure imaging prior to biopsy.   Risks and benefits of biopsy was discussed with the patient and/or patient's family including, but not limited to bleeding, infection, damage to adjacent structures or low yield requiring additional tests.  All of the  questions were answered and there is agreement to proceed.  Consent signed and in chart.  Thank you for this interesting consult.  I greatly enjoyed [place]oley and look forward to participating in their care.  A copy of this report was sent to the requesting provider on this date.  Electronically Signed: Docia Barrier, PA 03/05/2019, 12:29 PM   I spent a total of  30 Minutes   in face to face in clinical consultation, greater than 50% of which was counseling/coordinating care for lymphadenopathy.

## 2019-03-24 DIAGNOSIS — C73 Malignant neoplasm of thyroid gland: Secondary | ICD-10-CM | POA: Diagnosis not present

## 2019-04-14 DIAGNOSIS — C73 Malignant neoplasm of thyroid gland: Secondary | ICD-10-CM | POA: Diagnosis not present

## 2019-04-14 DIAGNOSIS — Z9089 Acquired absence of other organs: Secondary | ICD-10-CM | POA: Diagnosis not present

## 2019-04-29 ENCOUNTER — Other Ambulatory Visit: Payer: Self-pay | Admitting: Family Medicine

## 2019-07-29 ENCOUNTER — Other Ambulatory Visit: Payer: Self-pay | Admitting: Family Medicine

## 2019-08-21 ENCOUNTER — Telehealth: Payer: Self-pay | Admitting: *Deleted

## 2019-08-21 NOTE — Telephone Encounter (Signed)
Received call from patient [phone]~ telephone.   Reports that she is requesting letter for spouse's employer stating that she is high risk for complications if contracting COVID due to her thyroid cancer hx.  States that spouse's employer is trying to find out who needs to come back in office and who needs to continue to work from home. Requesting letter to keep husband at home so that he does not increase risk of contracting COVID and therefore exposing her.   Advised that telephone call will need to be completed with PCP.   Appointment scheduled.

## 2019-08-22 ENCOUNTER — Other Ambulatory Visit: Payer: Self-pay

## 2019-08-22 ENCOUNTER — Encounter: Payer: Self-pay | Admitting: Family Medicine

## 2019-08-22 ENCOUNTER — Ambulatory Visit (INDEPENDENT_AMBULATORY_CARE_PROVIDER_SITE_OTHER): Payer: 59 | Admitting: Family Medicine

## 2019-08-22 DIAGNOSIS — C73 Malignant neoplasm of thyroid gland: Secondary | ICD-10-CM

## 2019-08-22 DIAGNOSIS — I10 Essential (primary) hypertension: Secondary | ICD-10-CM

## 2019-08-22 DIAGNOSIS — Z7189 Other specified counseling: Secondary | ICD-10-CM

## 2019-08-22 NOTE — Telephone Encounter (Signed)
noted 

## 2019-08-22 NOTE — Progress Notes (Signed)
Virtual Visit via Telephone Note  I connected with Alexandria [name] on 08/22/19 at 9:55am by telephone and verified that I am speaking with the correct person using two identifiers.      Pt location: at home   Physician location:  In office, [facility], Vic Blackbird MD     On call: patient and physician   I discussed the limitations, risks, security and privacy concerns of performing an evaluation and management service by telephone and the availability of in person appointments. I also discussed with the patient that there may be a patient responsible charge related to this service. The patient expressed understanding and agreed to proceed.   History of Present Illness:  Pt husband works at [employer] he has been working from home for many months. He work requires notiication of any family members who are high risk as well as themselves  Husband has medical issues as well but goes to [place] She would like a letter stating that she is high risk for complications for [id number] the setting of her papillary thyroid carcinoma.  Note she was seen by ENT earlier this year she had a biopsy done in February that did show active papillary thyroid carcinoma but a small amount.  She was given option of surgery versus ablation they are currently trying to get the ablation set up but this is been difficult in the setting of COVID.  She also has risk factors of hypertension and obesity.  She currently does not have any symptoms.  She is working from home secondary to her risk as well  - Ireviewed care everywhere notes from Dr. Wilburn [email or web address] [email or web address]    Observations/Objective:  NAD noted on phone   Assessment and Plan: COVID-19 advice-involved in the high risk category secondary to her hypertension also her active papillary thyroid carcinoma.  She is at high risk of complications if she were to contract the disease therefore the ability to work from home with regards  to her husband would decrease her risk of coming in contact with the virus.  Letter will be provided for her husband's work  Follow Up Instructions:    I discussed the assessment and treatment plan with the patient. The patient was provided an opportunity to ask questions and all were answered. The patient agreed with the plan and demonstrated an understanding of the instructions.   The patient was advised to call back or seek an in-person evaluation if the symptoms worsen or if the condition fails to improve as anticipated.  I provided 11  minutes of non-face-to-face time during this encounter. End time: 10:06am  Vic Blackbird, MD

## 2019-11-11 IMAGING — CT CT ANGIO CHEST
2 of 6 series · 18 of 46 positions shown · IV contrast (omnipaque)
Comparison: Radiograph of same day.

CLINICAL DATA: Chest pain.

EXAM:
CT ANGIOGRAPHY CHEST WITH CONTRAST
TECHNIQUE: Multidetector CT imaging of the chest was performed using the
standard protocol during bolus administration of intravenous
contrast. Multiplanar CT image reconstructions and MIPs were
obtained to evaluate the vascular anatomy.
CONTRAST:  75mL OMNIPAQUE IOHEXOL 350 MG/ML SOLN

[Series 5: thins · axial · 0.64mm/px · z∈[-292,-76]mm · 15 of 238 slices shown]
[im 11/238  lung]
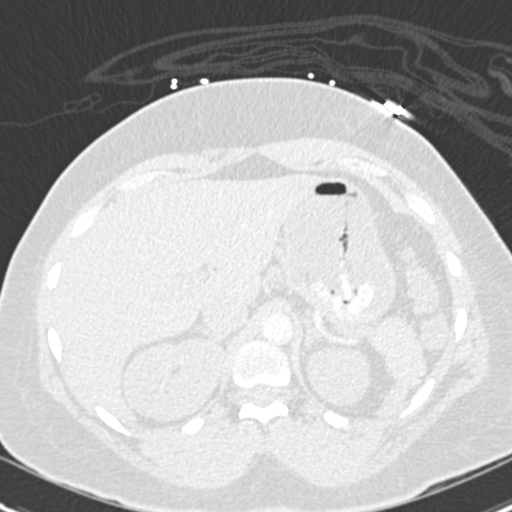
[im 31/238  soft-tissue]
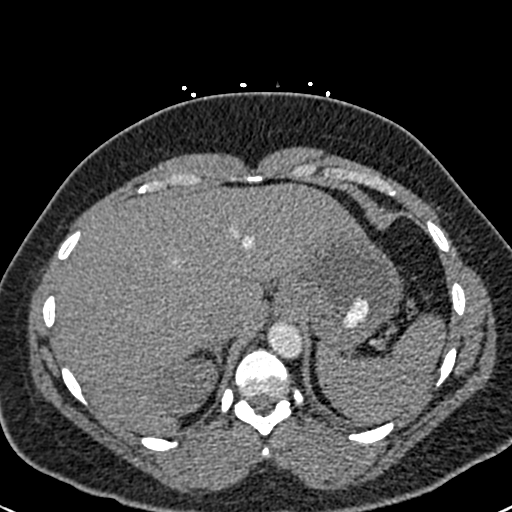
[im 42/238  lung]
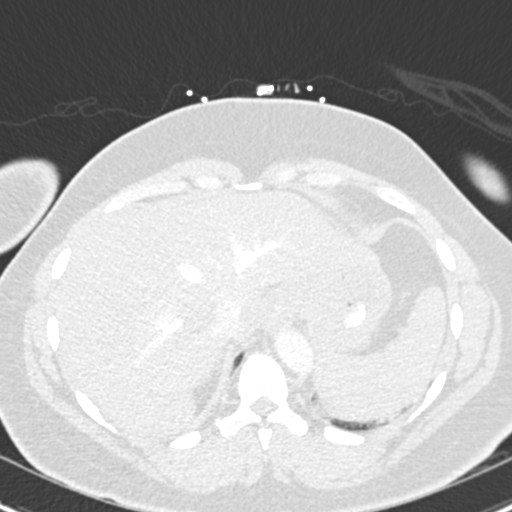
[im 62/238  soft-tissue]
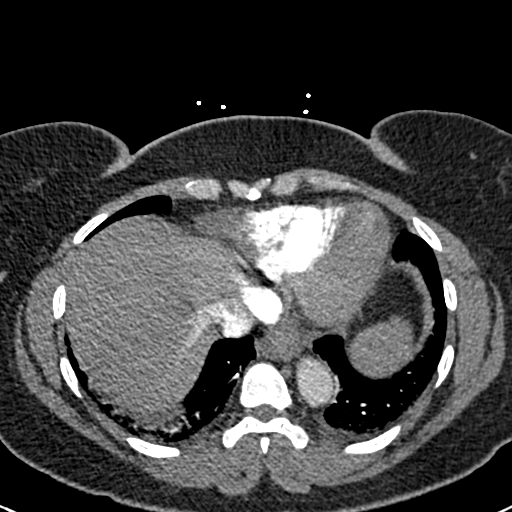
[im 73/238  lung]
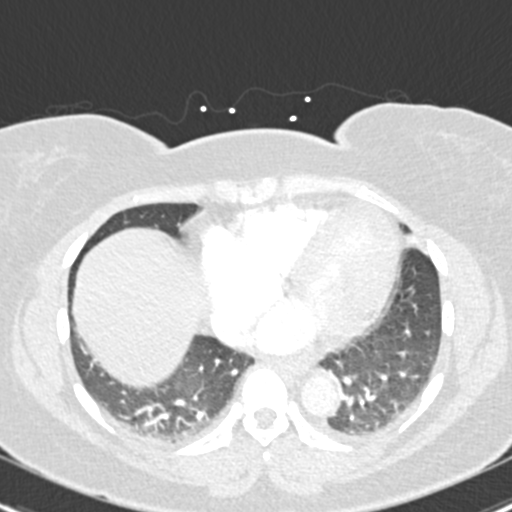
[im 93/238  soft-tissue]
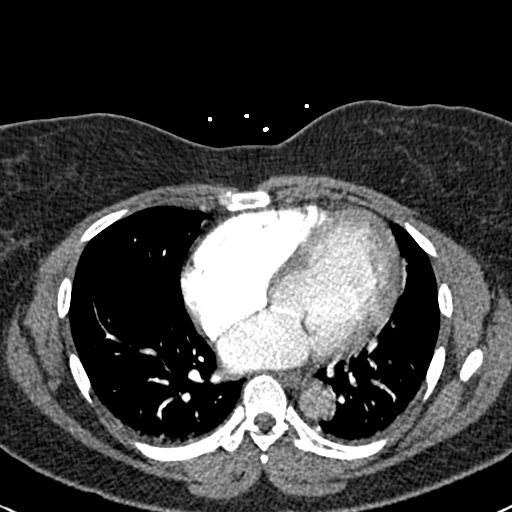
[im 104/238  lung]
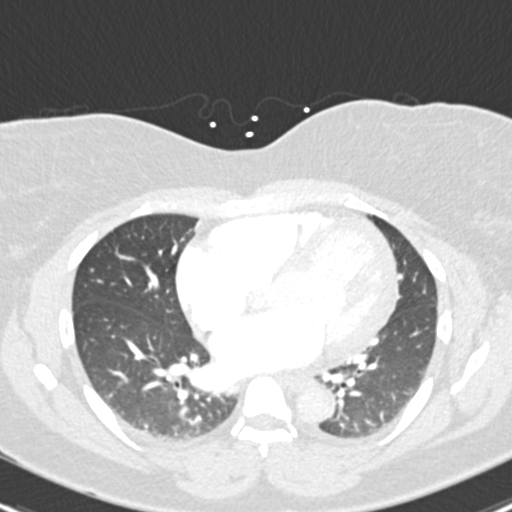
[im 124/238  soft-tissue]
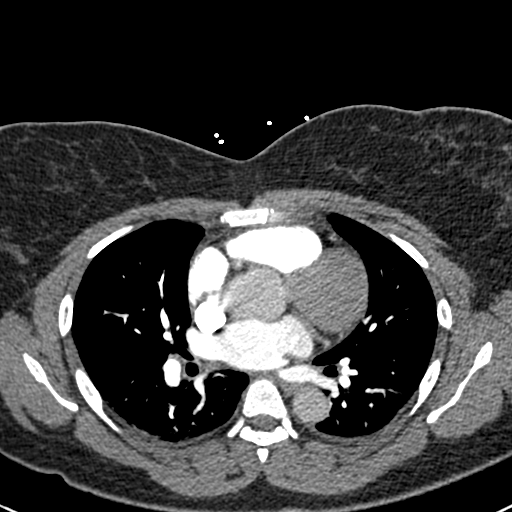
[im 134/238  lung]
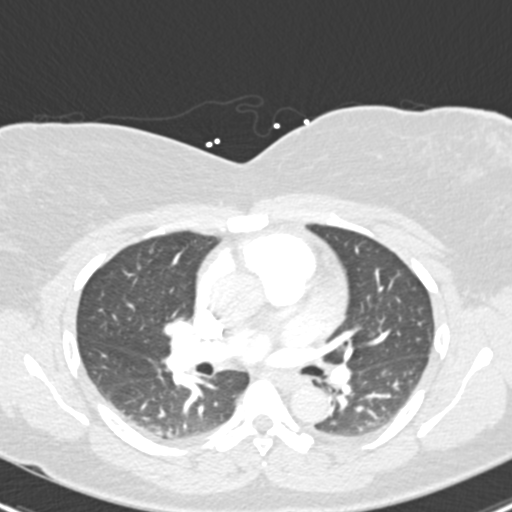
[im 145/238  soft-tissue]
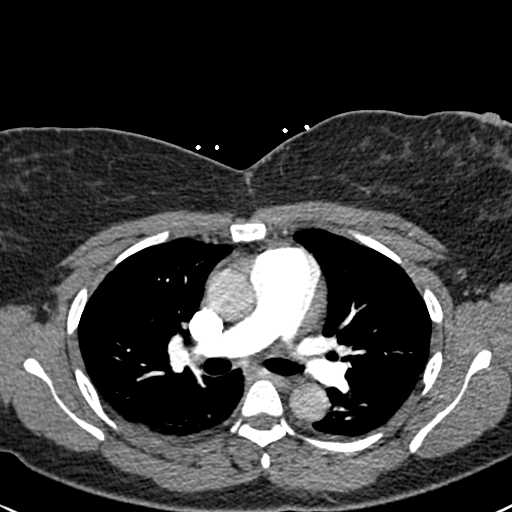
[im 165/238  lung]
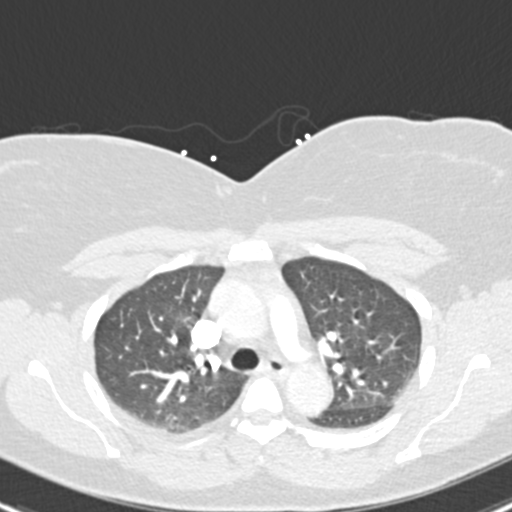
[im 176/238  soft-tissue]
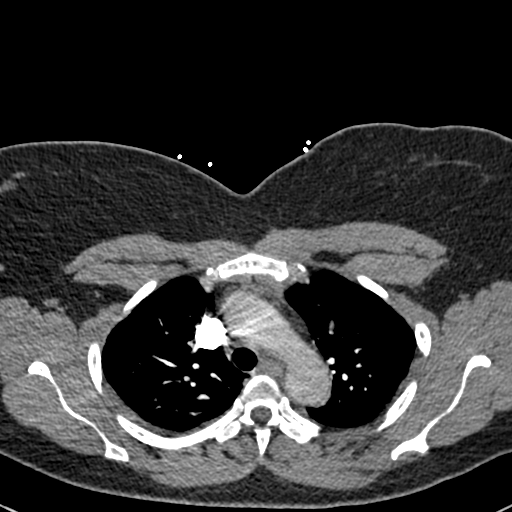
[im 196/238  lung]
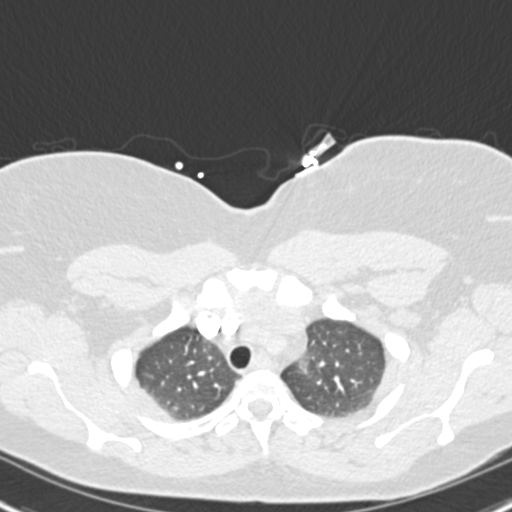
[im 207/238  soft-tissue]
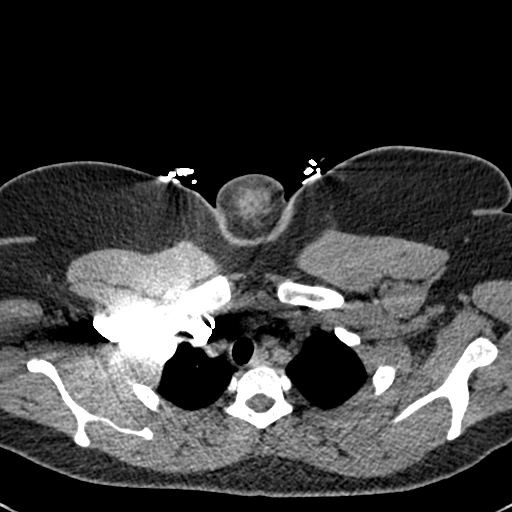
[im 227/238  lung]
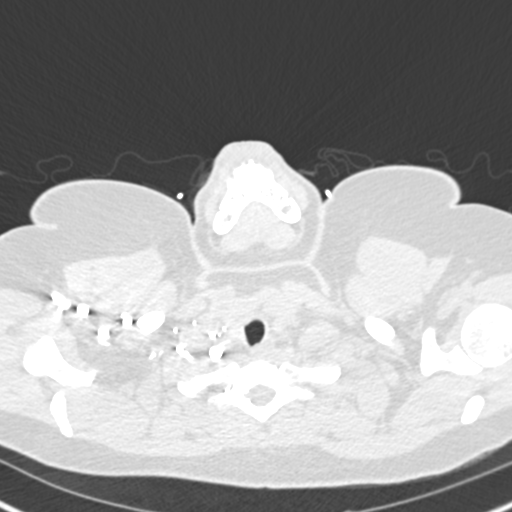

[Series 7: coronal mpr · coronal · 0.48mm/px · 3 of 82 slices shown]
[im 21/82  soft-tissue]
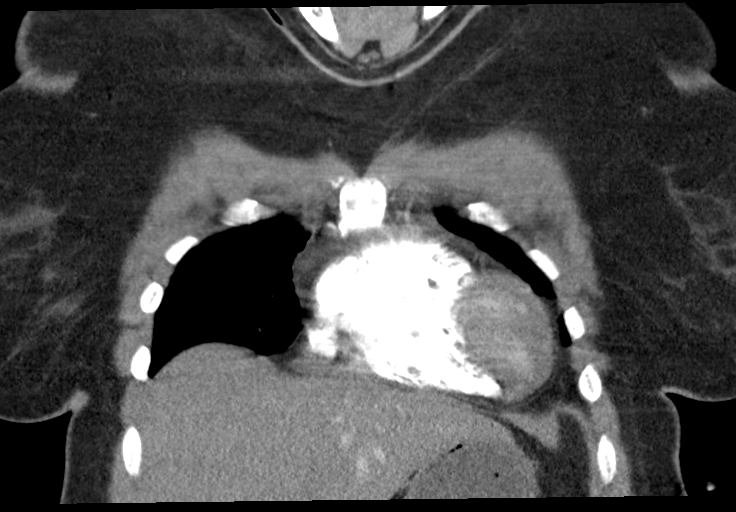
[im 41/82  soft-tissue]
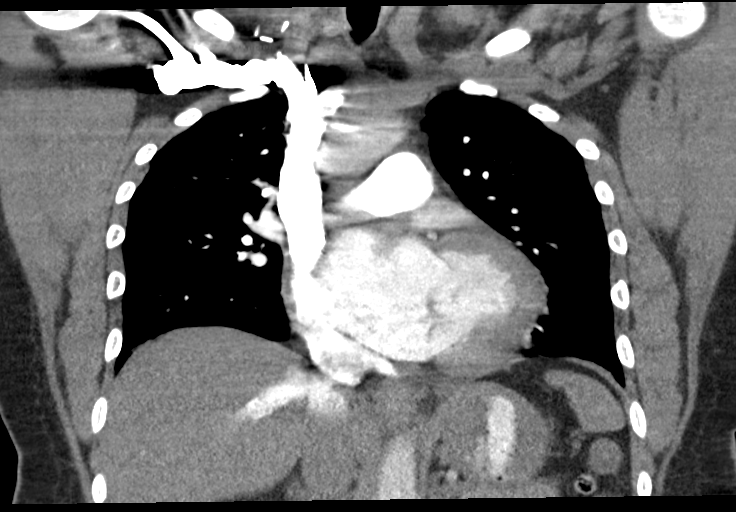
[im 61/82  soft-tissue]
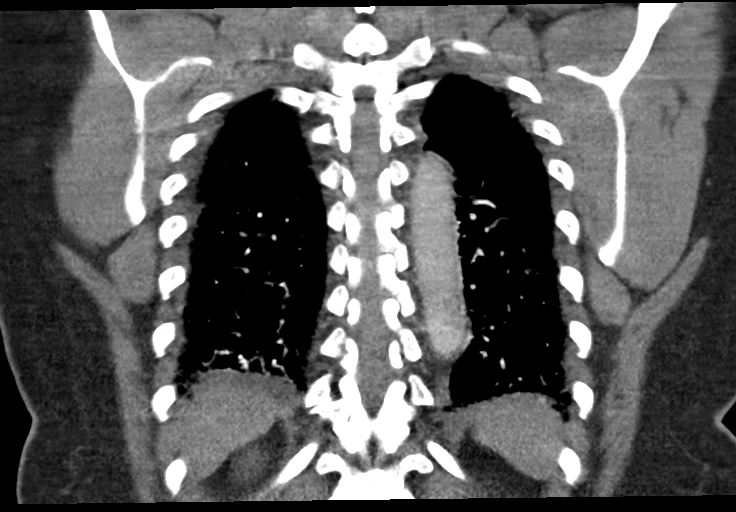

[18 of 46 positions shown; findings below may reference images not displayed]

FINDINGS: Cardiovascular: Satisfactory opacification of the pulmonary arteries
to the segmental level. No evidence of pulmonary embolism. Normal
heart size. No pericardial effusion.

Mediastinum/Nodes: No enlarged mediastinal, hilar, or axillary lymph
nodes. Thyroid gland, trachea, and esophagus demonstrate no
significant findings.

Lungs/Pleura: Lungs are clear. No pleural effusion or pneumothorax.

Upper Abdomen: No acute abnormality.

Musculoskeletal: No chest wall abnormality. No acute or significant
osseous findings.

Review of the MIP images confirms the above findings.
IMPRESSION: No definite evidence of pulmonary embolus. No definite abnormality
seen in the chest.

## 2019-12-13 ENCOUNTER — Other Ambulatory Visit: Payer: Self-pay | Admitting: Family Medicine

## 2019-12-24 ENCOUNTER — Other Ambulatory Visit: Payer: Self-pay | Admitting: Family Medicine

## 2019-12-24 DIAGNOSIS — Z1231 Encounter for screening mammogram for malignant neoplasm of breast: Secondary | ICD-10-CM

## 2019-12-29 ENCOUNTER — Other Ambulatory Visit: Payer: Self-pay

## 2019-12-29 ENCOUNTER — Ambulatory Visit
Admission: RE | Admit: 2019-12-29 | Discharge: 2019-12-29 | Disposition: A | Discharge status: Home / Self Care | Payer: 59 | Source: Ambulatory Visit

## 2019-12-29 DIAGNOSIS — Z1231 Encounter for screening mammogram for malignant neoplasm of breast: Secondary | ICD-10-CM

## 2019-12-30 ENCOUNTER — Other Ambulatory Visit: Payer: Self-pay | Admitting: Family Medicine

## 2019-12-30 ENCOUNTER — Telehealth: Payer: Self-pay

## 2019-12-30 DIAGNOSIS — R928 Other abnormal and inconclusive findings on diagnostic imaging of breast: Secondary | ICD-10-CM

## 2019-12-30 NOTE — Telephone Encounter (Signed)
The breast center handles ordering these things. They will also contact her for the appointment

## 2019-12-30 NOTE — Telephone Encounter (Signed)
Pt called today about her mammo results. They are wanting a diagnostic test and possible US done on both breasts. Is it ok to put in the orders? Please advise.

## 2020-01-01 ENCOUNTER — Ambulatory Visit
Admission: RE | Admit: 2020-01-01 | Discharge: 2020-01-01 | Disposition: A | Discharge status: Home / Self Care | Payer: 59 | Source: Ambulatory Visit | Attending: Family Medicine | Admitting: Family Medicine

## 2020-01-01 ENCOUNTER — Ambulatory Visit: Payer: 59

## 2020-01-01 ENCOUNTER — Other Ambulatory Visit: Payer: Self-pay | Admitting: Family Medicine

## 2020-01-01 ENCOUNTER — Other Ambulatory Visit: Payer: Self-pay

## 2020-01-01 DIAGNOSIS — R928 Other abnormal and inconclusive findings on diagnostic imaging of breast: Secondary | ICD-10-CM

## 2020-01-01 DIAGNOSIS — N6320 Unspecified lump in the left breast, unspecified quadrant: Secondary | ICD-10-CM

## 2020-01-01 DIAGNOSIS — N6310 Unspecified lump in the right breast, unspecified quadrant: Secondary | ICD-10-CM

## 2020-01-13 ENCOUNTER — Ambulatory Visit
Admission: RE | Admit: 2020-01-13 | Discharge: 2020-01-13 | Disposition: A | Discharge status: Home / Self Care | Payer: 59 | Source: Ambulatory Visit | Attending: Family Medicine | Admitting: Family Medicine

## 2020-01-13 ENCOUNTER — Other Ambulatory Visit: Payer: Self-pay

## 2020-01-13 DIAGNOSIS — N6320 Unspecified lump in the left breast, unspecified quadrant: Secondary | ICD-10-CM

## 2020-03-18 ENCOUNTER — Ambulatory Visit: Payer: 59 | Attending: Internal Medicine

## 2020-03-18 DIAGNOSIS — Z23 Encounter for immunization: Secondary | ICD-10-CM

## 2020-03-18 NOTE — Progress Notes (Signed)
   Covid-19 Vaccination Clinic  Name:  Alexandria [name]    MRN: [id number] DOB: [birth year]  03/18/2020  Alexandria [name] was observed post Covid-19 immunization for 15 minutes without incident. She was provided with Vaccine Information Sheet and instruction to access the V-Safe system.   Alexandria [name] was instructed to call 911 with any severe reactions post vaccine: Marland Kitchen Difficulty breathing  . Swelling of face and throat  . A fast heartbeat  . A bad rash all over body  . Dizziness and weakness   Immunizations Administered    Name Date Dose VIS Date Route   Pfizer COVID-19 Vaccine 03/18/2020 12:53 PM 0.3 mL 12/05/2019 Intramuscular   Manufacturer: [place]   Lot: [id number]   [place]: [id number]

## 2020-04-06 ENCOUNTER — Other Ambulatory Visit: Payer: Self-pay

## 2020-04-06 ENCOUNTER — Encounter: Payer: Self-pay | Admitting: Family Medicine

## 2020-04-06 ENCOUNTER — Ambulatory Visit (INDEPENDENT_AMBULATORY_CARE_PROVIDER_SITE_OTHER): Payer: 59 | Admitting: Family Medicine

## 2020-04-06 VITALS — BP 128/64 | HR 98 | Temp 98.7°F | Resp 14 | Ht 64.0 in | Wt 204.0 lb

## 2020-04-06 DIAGNOSIS — B9689 Other specified bacterial agents as the cause of diseases classified elsewhere: Secondary | ICD-10-CM

## 2020-04-06 DIAGNOSIS — Z6835 Body mass index (BMI) 35.0-35.9, adult: Secondary | ICD-10-CM

## 2020-04-06 DIAGNOSIS — N760 Acute vaginitis: Secondary | ICD-10-CM

## 2020-04-06 DIAGNOSIS — E890 Postprocedural hypothyroidism: Secondary | ICD-10-CM

## 2020-04-06 DIAGNOSIS — Z0001 Encounter for general adult medical examination with abnormal findings: Secondary | ICD-10-CM | POA: Diagnosis not present

## 2020-04-06 DIAGNOSIS — G43709 Chronic migraine without aura, not intractable, without status migrainosus: Secondary | ICD-10-CM

## 2020-04-06 DIAGNOSIS — E559 Vitamin D deficiency, unspecified: Secondary | ICD-10-CM

## 2020-04-06 DIAGNOSIS — I10 Essential (primary) hypertension: Secondary | ICD-10-CM

## 2020-04-06 DIAGNOSIS — Z124 Encounter for screening for malignant neoplasm of cervix: Secondary | ICD-10-CM

## 2020-04-06 DIAGNOSIS — Z0000 Encounter for general adult medical examination without abnormal findings: Secondary | ICD-10-CM

## 2020-04-06 LAB — WET PREP FOR TRICH, YEAST, CLUE

## 2020-04-06 MED ORDER — METRONIDAZOLE 500 MG PO TABS: 500.0000 mg | ORAL_TABLET | Freq: Two times a day (BID) | ORAL | 0 refills | Status: DC

## 2020-04-06 NOTE — Progress Notes (Signed)
Subjective:    Patient ID: Alexandria [name], female    DOB: 13-Jun-1981, 39 y.o.   MRN: [id number]  Patient presents for Gynecologic Exam (iss fasting)  Pt here for CPE    She has appt on Friday with her thyroid specialist shehad recurrence of her thyroid cancer, , now on synthroid  And cytomel 5mg  once a day   Hypertension- Maxide /losartan without any SE   Migraine- zonegran  Seasonal allergies- using zyrtec and flonase   Vaginal odor- concern for BV, she has used lavender tea/tree on the outside  LMP- was regular, has monthly cycle   Due for PAP Smear  She had a fall 1 week ago, walking up the stairs, she was carrying something and a cord was hang She fell onto right knee and elevaated, Iced it, Now improved   Vitamin D def- she is taking D3 gummies    Mammogram UTD  - Jan 2021, had biopsy   COVID vaccine #1 done  / TDAP/FLU UTD       Review Of Systems:  GEN- denies fatigue, fever, weight loss,weakness, recent illness HEENT- denies eye drainage, change in vision, nasal discharge, CVS- denies chest pain, palpitations RESP- denies SOB, cough, wheeze ABD- denies N/V, change in stools, abd pain GU- denies dysuria, hematuria, dribbling, incontinence MSK- denies joint pain, muscle aches, injury Neuro- denies headache, dizziness, syncope, seizure activity       Objective:    BP 128/64   Pulse 98   Temp 98.7 F (37.1 C) (Temporal)   Resp 14   Ht 5\' 4"  (1.626 m)   Wt 204 lb (92.5 kg)   SpO2 98%   BMI 35.02 kg/m  GEN- NAD, alert and oriented x3 HEENT- PERRL, EOMI, non injected sclera, pink conjunctiva, MMM, oropharynx clear Neck- Supple, no thyromegaly Breast- normal symmetry, no nipple inversion,no nipple drainage, no nodules or lumps felt Nodes- no axillary nodes CVS- RRR, no murmur RESP-CTAB ABD-NABS,soft,NT,ND GU- normal external genitalia, vaginal mucosa pink and moist, cervix visualized no growth, no blood form os, minimal thin clear discharge, no  CMT, no ovarian masses, uterus normal size, IUD strings visible EXT- No edema Pulses- Radial, DP- 2+        Assessment & Plan:      Problem List Items Addressed This Visit      Unprioritized   Hypertension    Controled no changes       Relevant Orders   CBC with Differential/Platelet (Completed)   Comprehensive metabolic panel (Completed)   Lipid panel (Completed)   Migraine headache    Controlled on meds per neurology       Obesity   Postsurgical hypothyroidism   Relevant Medications   liothyronine (CYTOMEL) 5 MCG tablet   levothyroxine (SYNTHROID) 137 MCG tablet   Vitamin D deficiency    Recheck level       Relevant Orders   Vitamin D, 25-hydroxy (Completed)    Other Visit Diagnoses    Routine general medical examination at a health care facility    -  Primary   CPE done, mammo done but needs repeat diagnostic in 2 months, she wants to transfer to another    Relevant Orders   CBC with Differential/Platelet (Completed)   Comprehensive metabolic panel (Completed)   Lipid panel (Completed)   BV (bacterial vaginosis)       Relevant Medications   metroNIDAZOLE (FLAGYL) 500 MG tablet   Other Relevant Orders   WET PREP FOR [place], YEAST, CLUE (Completed)  Cervical cancer screening       Relevant Orders   Pap IG w/ reflex to HPV when ASC-U (Completed)      Note: This dictation was prepared with Dragon dictation along with smaller phrase technology. Any transcriptional errors that result from this process are unintentional.

## 2020-04-06 NOTE — Patient Instructions (Signed)
F/U 6 months  Schedule your

## 2020-04-07 ENCOUNTER — Encounter: Payer: Self-pay | Admitting: Family Medicine

## 2020-04-07 LAB — PAP IG W/ RFLX HPV ASCU

## 2020-04-07 NOTE — Assessment & Plan Note (Signed)
Controlled on meds per neurology

## 2020-04-07 NOTE — Assessment & Plan Note (Signed)
Recheck level 

## 2020-04-07 NOTE — Assessment & Plan Note (Signed)
Controled no changes 

## 2020-04-10 LAB — CBC WITH DIFFERENTIAL/PLATELET
Absolute Monocytes: 481 cells/uL (ref 200–950)
Basophils Absolute: 32 cells/uL (ref 0–200)
Basophils Relative: 0.6 %
Eosinophils Absolute: 173 cells/uL (ref 15–500)
Eosinophils Relative: 3.2 %
HCT: 42.4 % (ref 35.0–45.0)
Hemoglobin: 13.9 g/dL (ref 11.7–15.5)
Lymphs Abs: 2252 cells/uL (ref 850–3900)
MCH: 28.5 pg (ref 27.0–33.0)
MCHC: 32.8 g/dL (ref 32.0–36.0)
MCV: 86.9 fL (ref 80.0–100.0)
MPV: 10.8 fL (ref 7.5–12.5)
Monocytes Relative: 8.9 %
Neutro Abs: 2462 cells/uL (ref 1500–7800)
Neutrophils Relative %: 45.6 %
Platelets: 346 10*3/uL (ref 140–400)
RBC: 4.88 10*6/uL (ref 3.80–5.10)
RDW: 12.3 % (ref 11.0–15.0)
Total Lymphocyte: 41.7 %
WBC: 5.4 10*3/uL (ref 3.8–10.8)

## 2020-04-10 LAB — COMPREHENSIVE METABOLIC PANEL
AG Ratio: 1.4 (calc) (ref 1.0–2.5)
ALT: 18 U/L (ref 6–29)
AST: 15 U/L (ref 10–30)
Albumin: 4.1 g/dL (ref 3.6–5.1)
Alkaline phosphatase (APISO): 93 U/L (ref 31–125)
BUN: 14 mg/dL (ref 7–25)
CO2: 28 mmol/L (ref 20–32)
Calcium: 9.7 mg/dL (ref 8.6–10.2)
Chloride: 107 mmol/L (ref 98–110)
Creat: 1.05 mg/dL (ref 0.50–1.10)
Globulin: 3 g/dL (calc) (ref 1.9–3.7)
Glucose, Bld: 103 mg/dL — ABNORMAL HIGH (ref 65–99)
Potassium: 4.1 mmol/L (ref 3.5–5.3)
Sodium: 141 mmol/L (ref 135–146)
Total Bilirubin: 0.2 mg/dL (ref 0.2–1.2)
Total Protein: 7.1 g/dL (ref 6.1–8.1)

## 2020-04-10 LAB — LIPID PANEL
Cholesterol: 142 mg/dL (ref <200)
HDL: 55 mg/dL (ref >=50)
LDL Cholesterol (Calc): 73 mg/dL (calc)
Non-HDL Cholesterol (Calc): 87 mg/dL (calc) (ref <130)
Total CHOL/HDL Ratio: 2.6 (calc) (ref <5.0)
Triglycerides: 67 mg/dL (ref <150)

## 2020-04-10 LAB — TEST AUTHORIZATION

## 2020-04-10 LAB — HEMOGLOBIN A1C W/OUT EAG: Hgb A1c MFr Bld: 5.6 % of total Hgb (ref <5.7)

## 2020-04-10 LAB — VITAMIN D 25 HYDROXY (VIT D DEFICIENCY, FRACTURES): Vit D, 25-Hydroxy: 92 ng/mL (ref 30–100)

## 2020-04-12 ENCOUNTER — Ambulatory Visit: Payer: 59 | Attending: Internal Medicine

## 2020-04-12 DIAGNOSIS — Z23 Encounter for immunization: Secondary | ICD-10-CM

## 2020-04-12 NOTE — Progress Notes (Signed)
   Covid-19 Vaccination Clinic  Name:  Alexandria [name]    MRN: [id number] DOB: 07/28/1981  04/12/2020  Alexandria [name] was observed post Covid-19 immunization for 15 minutes without incident. She was provided with Vaccine Information Sheet and instruction to access the V-Safe system.   Alexandria [name] was instructed to call 911 with any severe reactions post vaccine: Marland Kitchen Difficulty breathing  . Swelling of face and throat  . A fast heartbeat  . A bad rash all over body  . Dizziness and weakness   Immunizations Administered    Name Date Dose VIS Date Route   Pfizer COVID-19 Vaccine 04/12/2020 12:42 PM 0.3 mL 02/18/2019 Intramuscular   Manufacturer: [place]   Lot: [id number]   [place]: [id number]

## 2020-04-21 ENCOUNTER — Encounter: Payer: Self-pay | Admitting: Family Medicine

## 2020-06-08 ENCOUNTER — Telehealth: Payer: Self-pay | Admitting: *Deleted

## 2020-06-08 DIAGNOSIS — D242 Benign neoplasm of left breast: Secondary | ICD-10-CM

## 2020-06-08 DIAGNOSIS — R921 Mammographic calcification found on diagnostic imaging of breast: Secondary | ICD-10-CM

## 2020-06-08 NOTE — Addendum Note (Signed)
Addended by: Sheral Flow on: 06/08/2020 01:46 PM   Modules accepted: Orders

## 2020-06-08 NOTE — Telephone Encounter (Signed)
Received call from patient.   Reports that she is attempting to schedule F/U mammogram with [facility] as she does not wish to return to the [facility].   Reports that she requires new orders for [facility].   MD please advise.

## 2020-06-08 NOTE — Telephone Encounter (Signed)
Please get written order form for [facility] Alexandria [name] may have copy   Right breast f/u calcifications, Left breast fibroadenoma f/u s/p biospy Jan 2021  Diagnostic Mammogram/ ultrasound

## 2020-06-08 NOTE — Telephone Encounter (Signed)
Orders placed via Epic and printed for patient.   Noted patient in parking lot to pick up previous imaging reports and orders.

## 2020-06-20 ENCOUNTER — Other Ambulatory Visit: Payer: Self-pay | Admitting: Family Medicine

## 2020-06-21 ENCOUNTER — Other Ambulatory Visit: Payer: Self-pay

## 2020-06-21 MED ORDER — TRIAMTERENE-HCTZ 37.5-25 MG PO TABS: 1.0000 | ORAL_TABLET | Freq: Every day | ORAL | 1 refills | Status: DC

## 2020-06-21 NOTE — Telephone Encounter (Signed)
Last refill 12/15/19

## 2020-06-29 ENCOUNTER — Telehealth: Payer: Self-pay | Admitting: Family Medicine

## 2020-06-29 ENCOUNTER — Encounter: Payer: Self-pay | Admitting: Family Medicine

## 2020-06-29 MED ORDER — TRIAMTERENE-HCTZ 37.5-25 MG PO TABS: 1.0000 | ORAL_TABLET | Freq: Every day | ORAL | 1 refills | Status: DC

## 2020-06-29 NOTE — Telephone Encounter (Signed)
CB# [phone] Pt was told by the Pharmacy trying to reach out get a refill for Triamterene she hasn't taken her blood pressure a then a week ned for a 90 supply for insurance will cover. Pharmacy to [id number] - [place], [place]

## 2020-06-29 NOTE — Telephone Encounter (Signed)
Prescription sent to pharmacy.   Call placed to pharmacy. Was advised that prescription has been received and is in the process of being filled.

## 2020-08-28 ENCOUNTER — Encounter: Payer: Self-pay | Admitting: Family Medicine

## 2020-09-08 ENCOUNTER — Encounter: Payer: Self-pay | Admitting: Family Medicine

## 2020-09-08 ENCOUNTER — Ambulatory Visit (INDEPENDENT_AMBULATORY_CARE_PROVIDER_SITE_OTHER): Payer: 59 | Admitting: Family Medicine

## 2020-09-08 ENCOUNTER — Other Ambulatory Visit: Payer: Self-pay

## 2020-09-08 VITALS — BP 118/64 | HR 76 | Temp 97.8°F | Resp 14 | Ht 64.0 in | Wt 201.0 lb

## 2020-09-08 DIAGNOSIS — I952 Hypotension due to drugs: Secondary | ICD-10-CM

## 2020-09-08 DIAGNOSIS — E6609 Other obesity due to excess calories: Secondary | ICD-10-CM

## 2020-09-08 DIAGNOSIS — Z6834 Body mass index (BMI) 34.0-34.9, adult: Secondary | ICD-10-CM | POA: Diagnosis not present

## 2020-09-08 DIAGNOSIS — E890 Postprocedural hypothyroidism: Secondary | ICD-10-CM | POA: Diagnosis not present

## 2020-09-08 NOTE — Patient Instructions (Signed)
Reduce Maxide to 1/2 tablet daily for 2 weeks, if bp still < 120/80 , stop the medication after 2 weeks We will send results of labs F/U 4 months

## 2020-09-08 NOTE — Assessment & Plan Note (Signed)
She had recent changes made in her thyroid medication.  With regards to her hypertension I think she is being overtreated at this point.  She has made changes in her nutrition as well as her exercise.  I think she is getting some orthostasis during exercise.  We will decrease Maxide to half a tablet daily for 2 weeks.  She will check her blood pressure at home.  If her blood pressure is still on average less than 120/80 I would discontinue the Maxide and continue her on losartan.  She will send me readings in 2 weeks.  I will go ahead and check CBC and metabolic panel to assure no abnormalities

## 2020-09-08 NOTE — Progress Notes (Signed)
   Subjective:    Patient ID: Alexandria [name], female    DOB: 1981-05-25, 39 y.o.   MRN: [id number]  Patient presents for Follow-up (would like to discuss plan to gt off BP meds dizziness and hypotension)  Patient here with concern about her blood pressure.  Over the past month or so she has noted a decrease in her blood pressure.  She gets dizzy when she is exercising or bends over.  When she has had her blood pressure checked with her oncologist her blood pressure has been in the low 100s.  When she checks it at home it is typically less than 277 systolic.  She is taking Maxide as well as losartan.  She may significant changes in her nutrition and is now exercising and believes that this is why her blood pressure has been coming down.  She has not had any abnormal swelling.  She has not had any syncopal episodes.  No chest pain.  She does continue to follow with a [facility]urologist.  They are working that some neuropathic symptoms in her hands and feet and she is going back to have nerve conduction done on her lower extremities.   Review Of Systems:  GEN- denies fatigue, fever, weight loss,weakness, recent illness HEENT- denies eye drainage, change in vision, nasal discharge, CVS- denies chest pain, palpitations RESP- denies SOB, cough, wheeze ABD- denies N/V, change in stools, abd pain GU- denies dysuria, hematuria, dribbling, incontinence MSK- denies joint pain, muscle aches, injury Neuro- denies headache, dizziness, syncope, seizure activity       Objective:    BP 118/64   Pulse 76   Temp 97.8 F (36.6 C) (Temporal)   Resp 14   Ht 5\' 4"  (1.626 m)   Wt 201 lb (91.2 kg)   SpO2 98%   BMI 34.50 kg/m  GEN- NAD, alert and oriented x3 HEENT- PERRL, EOMI, non injected sclera, pink conjunctiva, MMM, oropharynx clear Neck- Supple, no thyromegaly CVS- RRR, no murmur RESP-CTAB EXT- No edema Pulses- Radial, DP- 2+        Assessment & Plan:      Problem  List Items Addressed This Visit      Unprioritized   Obesity   Relevant Orders   CBC with Differential/Platelet   Comprehensive metabolic panel   Postsurgical hypothyroidism    She had recent changes made in her thyroid medication.  With regards to her hypertension I think she is being overtreated at this point.  She has made changes in her nutrition as well as her exercise.  I think she is getting some orthostasis during exercise.  We will decrease Maxide to half a tablet daily for 2 weeks.  She will check her blood pressure at home.  If her blood pressure is still on average less than 120/80 I would discontinue the Maxide and continue her on losartan.  She will send me readings in 2 weeks.  I will go ahead and check CBC and metabolic panel to assure no abnormalities      Relevant Medications   levothyroxine (SYNTHROID) 100 MCG tablet    Other Visit Diagnoses    Hypotension due to drugs    -  Primary   Relevant Orders   CBC with Differential/Platelet   Comprehensive metabolic panel      Note: This dictation was prepared with Dragon dictation along with smaller phrase technology. Any transcriptional errors that result from this process are unintentional.

## 2020-09-09 LAB — CBC WITH DIFFERENTIAL/PLATELET
Absolute Monocytes: 725 cells/uL (ref 200–950)
Basophils Absolute: 30 cells/uL (ref 0–200)
Basophils Relative: 0.4 %
Eosinophils Absolute: 81 cells/uL (ref 15–500)
Eosinophils Relative: 1.1 %
HCT: 39.6 % (ref 35.0–45.0)
Hemoglobin: 13 g/dL (ref 11.7–15.5)
Lymphs Abs: 2627 cells/uL (ref 850–3900)
MCH: 28.3 pg (ref 27.0–33.0)
MCHC: 32.8 g/dL (ref 32.0–36.0)
MCV: 86.3 fL (ref 80.0–100.0)
MPV: 10.8 fL (ref 7.5–12.5)
Monocytes Relative: 9.8 %
Neutro Abs: 3937 cells/uL (ref 1500–7800)
Neutrophils Relative %: 53.2 %
Platelets: 305 10*3/uL (ref 140–400)
RBC: 4.59 10*6/uL (ref 3.80–5.10)
RDW: 12.2 % (ref 11.0–15.0)
Total Lymphocyte: 35.5 %
WBC: 7.4 10*3/uL (ref 3.8–10.8)

## 2020-09-09 LAB — COMPREHENSIVE METABOLIC PANEL
AG Ratio: 1.6 (calc) (ref 1.0–2.5)
ALT: 38 U/L — ABNORMAL HIGH (ref 6–29)
AST: 20 U/L (ref 10–30)
Albumin: 4.1 g/dL (ref 3.6–5.1)
Alkaline phosphatase (APISO): 82 U/L (ref 31–125)
BUN/Creatinine Ratio: 14 (calc) (ref 6–22)
BUN: 16 mg/dL (ref 7–25)
CO2: 28 mmol/L (ref 20–32)
Calcium: 9.5 mg/dL (ref 8.6–10.2)
Chloride: 104 mmol/L (ref 98–110)
Creat: 1.16 mg/dL — ABNORMAL HIGH (ref 0.50–1.10)
Globulin: 2.5 g/dL (calc) (ref 1.9–3.7)
Glucose, Bld: 87 mg/dL (ref 65–99)
Potassium: 3.6 mmol/L (ref 3.5–5.3)
Sodium: 139 mmol/L (ref 135–146)
Total Bilirubin: 0.3 mg/dL (ref 0.2–1.2)
Total Protein: 6.6 g/dL (ref 6.1–8.1)

## 2020-09-10 ENCOUNTER — Other Ambulatory Visit: Payer: Self-pay | Admitting: *Deleted

## 2020-09-10 DIAGNOSIS — R7989 Other specified abnormal findings of blood chemistry: Secondary | ICD-10-CM

## 2020-09-24 DIAGNOSIS — U071 COVID-19: Secondary | ICD-10-CM

## 2020-09-24 HISTORY — DX: COVID-19: U07.1

## 2020-09-25 ENCOUNTER — Encounter: Payer: Self-pay | Admitting: Family Medicine

## 2020-09-29 ENCOUNTER — Other Ambulatory Visit: Payer: Self-pay | Admitting: Nurse Practitioner

## 2020-09-29 ENCOUNTER — Telehealth: Payer: Self-pay | Admitting: Nurse Practitioner

## 2020-09-29 ENCOUNTER — Encounter: Payer: Self-pay | Admitting: Nurse Practitioner

## 2020-09-29 DIAGNOSIS — E669 Obesity, unspecified: Secondary | ICD-10-CM | POA: Insufficient documentation

## 2020-09-29 DIAGNOSIS — U071 COVID-19: Secondary | ICD-10-CM

## 2020-09-29 NOTE — Progress Notes (Signed)
I connected by phone with Alexandria [name] on 09/29/2020 at 12:30 PM to discuss the potential use of a new treatment for mild to moderate COVID-19 viral infection in non-hospitalized patients.  This patient is a 39 y.o. female that meets the FDA criteria for Emergency Use Authorization of COVID monoclonal antibody casirivimab/imdevimab.  Has a (+) direct SARS-CoV-2 viral test result  Has mild or moderate COVID-19   Is NOT hospitalized due to COVID-19  Is within 10 days of symptom onset  Has at least one of the high risk factor(s) for progression to severe COVID-19 and/or hospitalization as defined in EUA.  Specific high risk criteria : BMI > 25; HTN; Cancer w/ ongoing treatment   I have spoken and communicated the following to the patient or parent/caregiver regarding COVID monoclonal antibody treatment:  1. FDA has authorized the emergency use for the treatment of mild to moderate COVID-19 in adults and pediatric patients with positive results of direct SARS-CoV-2 viral testing who are 75 years of age and older weighing at least 40 kg, and who are at high risk for progressing to severe COVID-19 and/or hospitalization.  2. The significant known and potential risks and benefits of COVID monoclonal antibody, and the extent to which such potential risks and benefits are unknown.  3. Information on available alternative treatments and the risks and benefits of those alternatives, including clinical trials.  4. Patients treated with COVID monoclonal antibody should continue to self-isolate and use infection control measures (e.g., wear mask, isolate, social distance, avoid sharing personal items, clean and disinfect "high touch" surfaces, and frequent handwashing) according to CDC guidelines.   5. The patient or parent/caregiver has the option to accept or refuse COVID monoclonal antibody treatment.  After reviewing this information with the patient, the patient has agreed to receive one of the  available covid 19 monoclonal antibodies and will be provided an appropriate fact sheet prior to infusion.  [name], NP 09/29/2020 12:30 PM

## 2020-09-29 NOTE — Telephone Encounter (Signed)
I connected by phone with Alexandria [name] on 09/29/2020 at 12:30 PM to discuss the potential use of a new treatment for mild to moderate COVID-19 viral infection in non-hospitalized patients.  This patient is a 39 y.o. female that meets the FDA criteria for Emergency Use Authorization of COVID monoclonal antibody casirivimab/imdevimab.  Has a (+) direct SARS-CoV-2 viral test result  Has mild or moderate COVID-19   Is NOT hospitalized due to COVID-19  Is within 10 days of symptom onset  Has at least one of the high risk factor(s) for progression to severe COVID-19 and/or hospitalization as defined in EUA.  Specific high risk criteria : BMI > 25; HTN; Cancer w/ ongoing treatment   I have spoken and communicated the following to the patient or parent/caregiver regarding COVID monoclonal antibody treatment:  1. FDA has authorized the emergency use for the treatment of mild to moderate COVID-19 in adults and pediatric patients with positive results of direct SARS-CoV-2 viral testing who are 60 years of age and older weighing at least 40 kg, and who are at high risk for progressing to severe COVID-19 and/or hospitalization.  2. The significant known and potential risks and benefits of COVID monoclonal antibody, and the extent to which such potential risks and benefits are unknown.  3. Information on available alternative treatments and the risks and benefits of those alternatives, including clinical trials.  4. Patients treated with COVID monoclonal antibody should continue to self-isolate and use infection control measures (e.g., wear mask, isolate, social distance, avoid sharing personal items, clean and disinfect "high touch" surfaces, and frequent handwashing) according to CDC guidelines.   5. The patient or parent/caregiver has the option to accept or refuse COVID monoclonal antibody treatment.  After reviewing this information with the patient, the patient has agreed to receive one of the  available covid 19 monoclonal antibodies and will be provided an appropriate fact sheet prior to infusion.  [name], NP 09/29/2020 12:30 PM

## 2020-09-30 ENCOUNTER — Ambulatory Visit (HOSPITAL_COMMUNITY)
Admission: RE | Admit: 2020-09-30 | Discharge: 2020-09-30 | Disposition: A | Discharge status: Home / Self Care | Payer: 59 | Source: Ambulatory Visit | Attending: Pulmonary Disease | Admitting: Pulmonary Disease

## 2020-09-30 DIAGNOSIS — E669 Obesity, unspecified: Secondary | ICD-10-CM | POA: Insufficient documentation

## 2020-09-30 DIAGNOSIS — U071 COVID-19: Secondary | ICD-10-CM | POA: Insufficient documentation

## 2020-09-30 MED ORDER — DIPHENHYDRAMINE HCL 50 MG/ML IJ SOLN: 50.0000 mg | Freq: Once | INTRAMUSCULAR | Status: DC | PRN

## 2020-09-30 MED ORDER — FAMOTIDINE IN NACL 20-0.9 MG/50ML-% IV SOLN: 20.0000 mg | Freq: Once | INTRAVENOUS | Status: DC | PRN

## 2020-09-30 MED ORDER — EPINEPHRINE 0.3 MG/0.3ML IJ SOAJ: 0.3000 mg | Freq: Once | INTRAMUSCULAR | Status: DC | PRN

## 2020-09-30 MED ORDER — ALBUTEROL SULFATE HFA 108 (90 BASE) MCG/ACT IN AERS: 2.0000 | INHALATION_SPRAY | Freq: Once | RESPIRATORY_TRACT | Status: DC | PRN

## 2020-09-30 MED ORDER — SODIUM CHLORIDE 0.9 % IV SOLN: INTRAVENOUS | Status: DC | PRN

## 2020-09-30 MED ORDER — METHYLPREDNISOLONE SODIUM SUCC 125 MG IJ SOLR: 125.0000 mg | Freq: Once | INTRAMUSCULAR | Status: DC | PRN

## 2020-09-30 MED ORDER — SODIUM CHLORIDE 0.9 % IV SOLN: Freq: Once | INTRAVENOUS | Status: AC

## 2020-09-30 NOTE — Discharge Instructions (Signed)

## 2020-09-30 NOTE — Progress Notes (Signed)
  Diagnosis: COVID-19  Physician:Dr. Joya Gaskins  Procedure: [facility] Med: casirivimab\imdevimab infusion - Provided patient with casirivimab\imdevimab fact sheet for patients, parents and caregivers prior to infusion.  Complications: No immediate complications noted.  Discharge: Discharged home   Ludwig Lean 09/30/2020

## 2020-10-22 ENCOUNTER — Encounter: Payer: Self-pay | Admitting: Family Medicine

## 2020-10-22 ENCOUNTER — Ambulatory Visit: Payer: 59 | Admitting: Family Medicine

## 2020-10-22 ENCOUNTER — Other Ambulatory Visit: Payer: Self-pay

## 2020-10-22 VITALS — BP 136/90 | HR 69 | Temp 97.9°F | Resp 18 | Wt 198.0 lb

## 2020-10-22 DIAGNOSIS — Z8616 Personal history of COVID-19: Secondary | ICD-10-CM | POA: Diagnosis not present

## 2020-10-22 DIAGNOSIS — R071 Chest pain on breathing: Secondary | ICD-10-CM | POA: Diagnosis not present

## 2020-10-22 DIAGNOSIS — R7989 Other specified abnormal findings of blood chemistry: Secondary | ICD-10-CM

## 2020-10-22 DIAGNOSIS — J40 Bronchitis, not specified as acute or chronic: Secondary | ICD-10-CM

## 2020-10-22 DIAGNOSIS — I10 Essential (primary) hypertension: Secondary | ICD-10-CM | POA: Diagnosis not present

## 2020-10-22 MED ORDER — PREDNISONE 20 MG PO TABS: 20.0000 mg | ORAL_TABLET | Freq: Every day | ORAL | 0 refills | Status: DC

## 2020-10-22 NOTE — Progress Notes (Signed)
   Subjective:    Patient ID: Alexandria [name], female    DOB: [birth year], 39 y.o.   MRN: [id number]  Patient presents for Cough  Pt here with ongoing cough. She was positive for COVID-10 on in early Oct and received MAB infusion on 10/7 She states she still feels SOB at times  Mucinex and creamotion Cough with  production , some sinus pressure - minimal possutive emesis on Sunday  No fever  She had some wheezing for a few days and she will get tight in the chest at times, used her husband inhaler but it didn't help mucinex has helped the most She feels a [place] winded exerting herself but it is not extreme No GI symptoms  Due for repeat metabolic panel due to ARF in Sept  Review Of Systems:  GEN- denies fatigue, fever, weight loss,weakness, recent illness HEENT- denies eye drainage, change in vision, nasal discharge, CVS- denies chest pain, palpitations RESP- +SOB,+ cough, +wheeze ABD- denies N/V, change in stools, abd pain GU- denies dysuria, hematuria, dribbling, incontinence MSK- denies joint pain, muscle aches, injury Neuro- denies headache, dizziness, syncope, seizure activity       Objective:    BP 136/90   Pulse 69   Temp 97.9 F (36.6 C)   Resp 18   Wt 198 lb (89.8 kg)   SpO2 96%   BMI 33.99 kg/m  GEN- NAD, alert and oriented x3 HEENT- PERRL, EOMI, non injected sclera, pink conjunctiva, MMM, oropharynx clear , no sinus tenderness, enlarged nasal turbinates , clear rhinorrhea  Neck- Supple, no LAD  CVS- RRR, no murmur RESP-CTAB EXT- No edema Pulses- Radial  2+        Assessment & Plan:      Problem List Items Addressed This Visit      Unprioritized   Hypertension    Diastolic mildly elevated today however she is still getting over COVID-19 infection.  She continues to monitor at home.  No change in medication.  I will recheck the renal function today.  History of COVID-19 with residual cough bronchitis.  Advised patient that cough lingering is  normal with COVID-19.  Her oxygen saturations are normal.  She will continue Mucinex I did offer other cough medicine but due to setting team affect she declined.  We will give her prednisone 20 mg for 5 days to have on hand in case she starts having the wheezing episodes again to help with the bronchial irritation.  With the shortness of breath heart exam was benign today however she does have history of thyroid carcinoma increasing her risk for blood clots I will check a D-dimer       Other Visit Diagnoses    History of COVID-19    -  Primary   Relevant Orders   D-dimer, quantitative (not at [facility])   Bronchitis       Relevant Orders   CBC with Differential/Platelet   Comprehensive metabolic panel   D-dimer, quantitative (not at [facility])      Note: This dictation was prepared with Dragon dictation along with smaller phrase technology. Any transcriptional errors that result from this process are unintentional.

## 2020-10-22 NOTE — Assessment & Plan Note (Signed)
Diastolic mildly elevated today however she is still getting over COVID-19 infection.  She continues to monitor at home.  No change in medication.  I will recheck the renal function today.  History of COVID-19 with residual cough bronchitis.  Advised patient that cough lingering is normal with COVID-19.  Her oxygen saturations are normal.  She will continue Mucinex I did offer other cough medicine but due to setting team affect she declined.  We will give her prednisone 20 mg for 5 days to have on hand in case she starts having the wheezing episodes again to help with the bronchial irritation.  With the shortness of breath heart exam was benign today however she does have history of thyroid carcinoma increasing her risk for blood clots I will check a D-dimer

## 2020-10-22 NOTE — Patient Instructions (Signed)
F/U pending results   

## 2020-10-23 LAB — CBC WITH DIFFERENTIAL/PLATELET
Absolute Monocytes: 361 cells/uL (ref 200–950)
Basophils Absolute: 19 cells/uL (ref 0–200)
Basophils Relative: 0.5 %
Eosinophils Absolute: 122 cells/uL (ref 15–500)
Eosinophils Relative: 3.2 %
HCT: 40.3 % (ref 35.0–45.0)
Hemoglobin: 13.2 g/dL (ref 11.7–15.5)
Lymphs Abs: 1623 cells/uL (ref 850–3900)
MCH: 28.5 pg (ref 27.0–33.0)
MCHC: 32.8 g/dL (ref 32.0–36.0)
MCV: 87 fL (ref 80.0–100.0)
MPV: 10.7 fL (ref 7.5–12.5)
Monocytes Relative: 9.5 %
Neutro Abs: 1676 cells/uL (ref 1500–7800)
Neutrophils Relative %: 44.1 %
Platelets: 330 10*3/uL (ref 140–400)
RBC: 4.63 10*6/uL (ref 3.80–5.10)
RDW: 12.4 % (ref 11.0–15.0)
Total Lymphocyte: 42.7 %
WBC: 3.8 10*3/uL (ref 3.8–10.8)

## 2020-10-23 LAB — COMPREHENSIVE METABOLIC PANEL
AG Ratio: 1.6 (calc) (ref 1.0–2.5)
ALT: 19 U/L (ref 6–29)
AST: 18 U/L (ref 10–30)
Albumin: 4.1 g/dL (ref 3.6–5.1)
Alkaline phosphatase (APISO): 79 U/L (ref 31–125)
BUN: 14 mg/dL (ref 7–25)
CO2: 24 mmol/L (ref 20–32)
Calcium: 9.2 mg/dL (ref 8.6–10.2)
Chloride: 109 mmol/L (ref 98–110)
Creat: 1.07 mg/dL (ref 0.50–1.10)
Globulin: 2.5 g/dL (calc) (ref 1.9–3.7)
Glucose, Bld: 100 mg/dL — ABNORMAL HIGH (ref 65–99)
Potassium: 4.1 mmol/L (ref 3.5–5.3)
Sodium: 140 mmol/L (ref 135–146)
Total Bilirubin: 0.3 mg/dL (ref 0.2–1.2)
Total Protein: 6.6 g/dL (ref 6.1–8.1)

## 2020-10-23 LAB — D-DIMER, QUANTITATIVE: D-Dimer, Quant: 0.88 mcg/mL FEU — ABNORMAL HIGH (ref <0.50)

## 2020-10-23 NOTE — Addendum Note (Signed)
Addended by: Vic Blackbird F on: 10/23/2020 10:04 AM   Modules accepted: Orders

## 2020-10-25 ENCOUNTER — Ambulatory Visit
Admission: RE | Admit: 2020-10-25 | Discharge: 2020-10-25 | Disposition: A | Discharge status: Home / Self Care | Payer: 59 | Source: Ambulatory Visit | Attending: Family Medicine | Admitting: Family Medicine

## 2020-10-25 ENCOUNTER — Other Ambulatory Visit: Payer: Self-pay

## 2020-10-25 DIAGNOSIS — R7989 Other specified abnormal findings of blood chemistry: Secondary | ICD-10-CM | POA: Insufficient documentation

## 2020-10-25 DIAGNOSIS — Z8616 Personal history of COVID-19: Secondary | ICD-10-CM | POA: Diagnosis present

## 2020-10-25 DIAGNOSIS — R071 Chest pain on breathing: Secondary | ICD-10-CM | POA: Diagnosis present

## 2020-10-25 MED ORDER — IOHEXOL 350 MG/ML SOLN
75.0000 mL | Freq: Once | INTRAVENOUS | Status: AC | PRN
  Administered 2020-10-25: 75 mL via INTRAVENOUS

## 2020-10-26 ENCOUNTER — Encounter: Payer: Self-pay | Admitting: Family Medicine

## 2021-01-13 ENCOUNTER — Encounter: Payer: Self-pay | Admitting: Family Medicine

## 2021-01-13 LAB — HM MAMMOGRAPHY

## 2021-01-17 ENCOUNTER — Ambulatory Visit: Payer: 59 | Admitting: Family Medicine

## 2021-01-17 ENCOUNTER — Encounter: Payer: Self-pay | Admitting: Family Medicine

## 2021-01-17 ENCOUNTER — Other Ambulatory Visit: Payer: Self-pay

## 2021-01-17 VITALS — BP 132/80 | HR 84 | Temp 98.1°F | Resp 14 | Ht 64.0 in | Wt 196.0 lb

## 2021-01-17 DIAGNOSIS — E890 Postprocedural hypothyroidism: Secondary | ICD-10-CM

## 2021-01-17 DIAGNOSIS — I10 Essential (primary) hypertension: Secondary | ICD-10-CM | POA: Diagnosis not present

## 2021-01-17 DIAGNOSIS — G43709 Chronic migraine without aura, not intractable, without status migrainosus: Secondary | ICD-10-CM

## 2021-01-17 DIAGNOSIS — R431 Parosmia: Secondary | ICD-10-CM | POA: Diagnosis not present

## 2021-01-17 DIAGNOSIS — Z6833 Body mass index (BMI) 33.0-33.9, adult: Secondary | ICD-10-CM

## 2021-01-17 DIAGNOSIS — E6609 Other obesity due to excess calories: Secondary | ICD-10-CM

## 2021-01-17 NOTE — Assessment & Plan Note (Signed)
Increase physical activity, keep protein with each meal Add resistance training

## 2021-01-17 NOTE — Assessment & Plan Note (Signed)
Controlled no change to med Since she still gets some fluctation upper limits, will keep Maxzide at 1/2 tab, continue losartan  25mg  Recent CMET wnl

## 2021-01-17 NOTE — Progress Notes (Signed)
   Subjective:    Patient ID: Alexandria [name], female    DOB: January 07, 1981, 40 y.o.   MRN: [id number]  Patient presents for Follow-up (BP- would like to come off meds- is taking 1/2 tab) and S/P COVID (States that she still has lingering Sx- changes in smell)   She continues to have olfactory changes- smells like cardboard, very fragrant smells or odorours   Few weeks ate at [facility] sine then smell has worsened She did repeat COVID test it was negative      HTN- she is on 1/2 tab of maxzide  And losartan  $RemoveB'25mg'[id number]$   Bp has been 130 / 80-90's She is trying to be more active and exercise, she is taking her vitamin supplements and increasing her water intake  Hypothyroidism, she is now on 5mcg of levothyroxine  Cytomel was 39mcg BID  She is getting a PET scan  Deceb 16th,  TSH 0.558 / T3 3.8 FT4 1.38 CMET glu 89 / BUN 1 /Cr 1.09   K 3.8  LFT Alk phos 82 , AST 18 ALT 24  Had cyst on breast- had mammogram/US it was benign, has F/U in 1 year   Followed by neurology for migraines       Review Of Systems:  GEN- denies fatigue, fever, weight loss,weakness, recent illness HEENT- denies eye drainage, change in vision, nasal discharge, CVS- denies chest pain, palpitations RESP- denies SOB, cough, wheeze ABD- denies N/V, change in stools, abd pain GU- denies dysuria, hematuria, dribbling, incontinence MSK- denies joint pain, muscle aches, injury Neuro- denies headache, dizziness, syncope, seizure activity       Objective:    BP 132/80   Pulse 84   Temp 98.1 F (36.7 C) (Temporal)   Resp 14   Ht $R'5\' 4"'SH$  (1.626 m)   Wt 196 lb (88.9 kg)   SpO2 99%   BMI 33.64 kg/m  GEN- NAD, alert and oriented x3 HEENT- PERRL, EOMI, non injected sclera, pink conjunctiva, MMM, oropharynx clear, nares clear, no effusion, no sinus tenderness Neck- Supple, no thyromegaly CVS- RRR, no murmur RESP-CTAB ABD-NABS,soft,NT,ND EXT- No edema Pulses- Radial, DP- 2+        Assessment & Plan:       Problem List Items Addressed This Visit      Unprioritized   Hypertension    Controlled no change to med Since she still gets some fluctation upper limits, will keep Maxzide at 1/2 tab, continue losartan  $RemoveB'25mg'ecvinvOo$  Recent CMET wnl      Migraine headache   Obesity    Increase physical activity, keep protein with each meal Add resistance training       Postsurgical hypothyroidism    Per endocrinology, history of thyroid cancer       Relevant Medications   liothyronine (CYTOMEL) 5 MCG tablet   levothyroxine (SYNTHROID) 75 MCG tablet    Other Visit Diagnoses    Parosmia s/p COVID    -  Primary   Unforunately no good treatmnet for this, just time for nerves to heal      Note: This dictation was prepared with Dragon dictation along with smaller phrase technology. Any transcriptional errors that result from this process are unintentional.

## 2021-01-17 NOTE — Patient Instructions (Addendum)
I will curbside ENT about the taste/smell since COVID Add resistance training Continue current blood pressure medication F/U 4 months Alexandria [name] IF YOU don establish with new PCP

## 2021-01-17 NOTE — Assessment & Plan Note (Signed)
Per endocrinology, history of thyroid cancer

## 2021-01-27 ENCOUNTER — Other Ambulatory Visit (HOSPITAL_COMMUNITY): Payer: Self-pay | Admitting: Internal Medicine

## 2021-01-27 DIAGNOSIS — C73 Malignant neoplasm of thyroid gland: Secondary | ICD-10-CM

## 2021-02-04 ENCOUNTER — Ambulatory Visit (HOSPITAL_COMMUNITY)
Admission: RE | Admit: 2021-02-04 | Discharge: 2021-02-04 | Disposition: A | Discharge status: Home / Self Care | Payer: 59 | Source: Ambulatory Visit | Attending: Internal Medicine | Admitting: Internal Medicine

## 2021-02-04 ENCOUNTER — Ambulatory Visit (HOSPITAL_COMMUNITY): Admission: RE | Admit: 2021-02-04 | Payer: 59 | Source: Ambulatory Visit

## 2021-02-04 ENCOUNTER — Encounter (HOSPITAL_COMMUNITY): Payer: Self-pay

## 2021-02-04 ENCOUNTER — Other Ambulatory Visit: Payer: Self-pay

## 2021-02-04 DIAGNOSIS — C73 Malignant neoplasm of thyroid gland: Secondary | ICD-10-CM | POA: Insufficient documentation

## 2021-02-04 LAB — GLUCOSE, CAPILLARY: Glucose-Capillary: 89 mg/dL (ref 70–99)

## 2021-02-04 MED ORDER — FLUDEOXYGLUCOSE F - 18 (FDG) INJECTION
9.6100 | Freq: Once | INTRAVENOUS | Status: AC
  Administered 2021-02-04: 9.61 via INTRAVENOUS

## 2021-03-15 ENCOUNTER — Other Ambulatory Visit: Payer: Self-pay | Admitting: *Deleted

## 2021-03-15 ENCOUNTER — Ambulatory Visit: Payer: 59 | Admitting: Nurse Practitioner

## 2021-03-15 DIAGNOSIS — R1030 Lower abdominal pain, unspecified: Secondary | ICD-10-CM

## 2021-03-16 ENCOUNTER — Ambulatory Visit
Admission: RE | Admit: 2021-03-16 | Discharge: 2021-03-16 | Disposition: A | Discharge status: Home / Self Care | Payer: 59 | Source: Ambulatory Visit | Attending: *Deleted | Admitting: *Deleted

## 2021-03-16 ENCOUNTER — Other Ambulatory Visit: Payer: Self-pay

## 2021-03-16 ENCOUNTER — Ambulatory Visit: Payer: 59 | Admitting: Nurse Practitioner

## 2021-03-16 DIAGNOSIS — R1030 Lower abdominal pain, unspecified: Secondary | ICD-10-CM

## 2021-03-16 MED ORDER — IOPAMIDOL (ISOVUE-300) INJECTION 61%
100.0000 mL | Freq: Once | INTRAVENOUS | Status: AC | PRN
  Administered 2021-03-16: 100 mL via INTRAVENOUS

## 2021-03-18 ENCOUNTER — Other Ambulatory Visit: Payer: Self-pay

## 2021-03-18 ENCOUNTER — Ambulatory Visit (INDEPENDENT_AMBULATORY_CARE_PROVIDER_SITE_OTHER): Payer: 59 | Admitting: Nurse Practitioner

## 2021-03-18 ENCOUNTER — Encounter: Payer: Self-pay | Admitting: Nurse Practitioner

## 2021-03-18 VITALS — BP 132/82 | HR 75 | Temp 98.0°F | Ht 64.0 in | Wt 187.6 lb

## 2021-03-18 DIAGNOSIS — C73 Malignant neoplasm of thyroid gland: Secondary | ICD-10-CM | POA: Diagnosis not present

## 2021-03-18 DIAGNOSIS — K5792 Diverticulitis of intestine, part unspecified, without perforation or abscess without bleeding: Secondary | ICD-10-CM | POA: Diagnosis not present

## 2021-03-18 NOTE — Assessment & Plan Note (Addendum)
Acute.  Noted on CT imaging 03/16/2021.  Patient is on appropriate antimicrobial management.  Continue this therapy through completion.  Discussed and encouraged soft diet to help with abdominal pain for new few days.  If symptoms worsen, start clear liquids only.  CT imaging showed mass on right lobe of liver; previously visualized on MRI with [facility] in 2013 which suggested hemangioma.  Recent PET scan did not demonstrate FDG accumulation in this area.  After discussion, patients elects to monitor closely for now.  Patient to return to clinic if symptoms persist despite antibiotic completion.  If symptoms worsen or with n/v and unable to keep fluids down, blood in stool, or fever, go to ER.

## 2021-03-18 NOTE — Patient Instructions (Signed)
Soft-Food Eating Plan A soft-food eating plan includes foods that are safe and easy to chew and swallow. Your health care provider or dietitian can help you find foods and flavors that fit into this plan. Follow this plan until your health care provider or dietitian says it is safe to start eating other foods and food textures. What are tips for following this plan? General guidelines  Take small bites of food, or cut food into pieces about  inch or smaller. Bite-sized pieces of food are easier to chew and swallow.  Eat moist foods. Avoid overly dry foods.  Avoid foods that: ? Are difficult to swallow, such as dry, chunky, crispy, or sticky foods. ? Are difficult to chew, such as hard, tough, or stringy foods. ? Contain nuts, seeds, or fruits.  Follow instructions from your dietitian about the types of liquids that are safe for you to swallow. You may be allowed to have: ? Thick liquids only. This includes only liquids that are thicker than honey. ? Thin and thick liquids. This includes all beverages and foods that become liquid at room temperature.  To make thick liquids: ? Purchase a commercial liquid thickening powder. These are available at grocery stores and pharmacies. ? Mix the thickener into liquids according to instructions on the label. ? Purchase ready-made thickened liquids. ? Thicken soup by pureeing, straining to remove chunks, and adding flour, potato flakes, or corn starch. ? Add commercial thickener to foods that become liquid at room temperature, such as milk shakes, yogurt, ice cream, gelatin, and sherbet.  Ask your health care provider whether you need to take a fiber supplement.   Cooking  Cook meats so they stay tender and moist. Use methods like braising, stewing, or baking in liquid.  Cook vegetables and fruit until they are soft enough to be mashed with a fork.  Peel soft, fresh fruits such as peaches, nectarines, and melons.  When making soup, make sure  chunks of meat and vegetables are smaller than  inch.  Reheat leftover foods slowly so that a tough crust does not form. What foods are allowed? The items listed below may not be a complete list. Talk with your dietitian about what dietary choices are best for you. Grains Breads, muffins, pancakes, or waffles moistened with syrup, jelly, or butter. Dry cereals well-moistened with milk. Moist, cooked cereals. Well-cooked pasta and rice. Vegetables All soft-cooked vegetables. Shredded lettuce. Fruits All canned and cooked fruits. Soft, peeled fresh fruits. Strawberries. Dairy Milk. Cream. Yogurt. Cottage cheese. Soft cheese without the rind. Meats and other protein foods Tender, moist ground meat, poultry, or fish. Meat cooked in gravy or sauces. Eggs. Sweets and desserts Ice cream. Milk shakes. Sherbet. Pudding. Fats and oils Butter. Margarine. Olive, canola, sunflower, and grapeseed oil. Smooth salad dressing. Smooth cream cheese. Mayonnaise. Gravy. What foods are not allowed? The items listed bemay not be a complete list. Talk with your dietitian about what dietary choices are best for you. Grains Coarse or dry cereals, such as bran, granola, and shredded wheat. Tough or chewy crusty breads, such as [place] bread or baguettes. Breads with nuts, seeds, or fruit. Vegetables All raw vegetables. Cooked corn. Cooked vegetables that are tough or stringy. Tough, crisp, fried potatoes and potato skins. Fruits Fresh fruits with skins or seeds, or both, such as apples, pears, and grapes. Stringy, high-pulp fruits, such as papaya, pineapple, coconut, and mango. Fruit leather and all dried fruit. Dairy Yogurt with nuts or coconut. Meats and other protein foods  Hard, dry sausages. Dry meat, poultry, or fish. Meats with gristle. Fish with bones. Fried meat or fish. Lunch meat and hotdogs. Nuts and seeds. Chunky peanut butter or other nut butters. Sweets and desserts Cakes or cookies that are very  dry or chewy. Desserts with dried fruit, nuts, or coconut. Fried pastries. Very rich pastries. Fats and oils Cream cheese with fruit or nuts. Salad dressings with seeds or chunks. Summary  A soft-food eating plan includes foods that are safe and easy to swallow. Generally, the foods should be soft enough to be mashed with a fork.  Avoid foods that are dry, hard to chew, crunchy, sticky, stringy, or crispy.  Ask your health care provider whether you need to thicken your liquids and if you need to take a fiber supplement. This information is not intended to replace advice given to you by your health care provider. Make sure you discuss any questions you have with your health care provider. Document Revised: 04/03/2019 Document Reviewed: 02/13/2017 Elsevier Patient Education  2021 [employer].

## 2021-03-18 NOTE — Progress Notes (Signed)
Subjective:    Patient ID: Alexandria [name], female    DOB: 01/25/1981, 40 y.o.   MRN: [id number]  HPI: Alexandria [name] is a 40 y.o. female presenting for diverticulitis follow up.  Chief Complaint  Patient presents with  . Diverticulitis    Having pain in the stomach and back, nausea due to the diverticulitis. Imaging completed yesterdat   ABDOMINAL PAIN  Thinks popcorn triggered this episode of diverticulitis - ate some for the first time on Saturday.  Abdominal pain symptoms started over the weekend.  She went to [facility] on Tuesday and was diagnosed with diverticulitis Wednesday.  Started on Augmentin twice daily for 10 days Wednesday.  Reports overall, her symptoms are improving slightly. Duration: started Friday/Saturday Onset: sudden Severity: 5/10 - headache is worse Quality: sometimes tight/gripping, uncomfortable, feels like cramping/labor pains Location:  LLQ mostly  Episode duration: minutes Radiation: no Frequency: multiple times per day Alleviating factors: laying, Tramadol Aggravating factors: eating certain foods Status: better Treatments attempted: currently on Augmentin x 10 days Fever: no; gets hot at night - furnace turning on Nausea: no Vomiting: yes; 1 episode yesterday Weight loss: no Decreased appetite: no Diarrhea: yes Constipation: yes Blood in stool: no Heartburn: no; burping a lot Jaundice: no Rash: no, skin is itchy - started in past 2 days since contrast Dysuria/urinary frequency: no Hematuria: no  NSAID use: took first 2 days;   24 hour diet recall: Ginger ale with [employer], chicken noodle soup, meatloaf, mashed potatoes, green beans, smoothie (almond milk, protein powder, frozen fruit pineapple, mango, strawberries) vegetable greens powder; 1/2 english muffin with [place] sausage, cheese.  Of note, she does have a history of thyroid cancer.  She is currently seeing 2 Endocrinologists and is due to see Dr. Koleen Nimrod in April for next steps.   She had a recent PET scan from her skull base to thigh to measure for metastatic disease.  She had a colonoscopy when she was 43 (2019)  to determine the cause of abdominal pain and IBS-c. A few small-mouthed diverticula were found in the left colon, there was no bleeding.  We discussed her imaging from urgent care.  It showed acute diverticulitis of the distal descending colon without perforation or abscess.  She is on appropriate treatment with Augmentin twice daily for 10 days.    Allergies  Allergen Reactions  . Penicillins Other (See Comments)    Has not had since child, thinks she had reaction to it then    Outpatient Encounter Medications as of 03/18/2021  Medication Sig  . acetaminophen (TYLENOL) 325 MG tablet Take 2 tablets (650 mg total) by mouth every 6 (six) hours as needed for mild pain or headache.  Marland Kitchen amoxicillin-clavulanate (AUGMENTIN) 875-125 MG tablet Take 1 tablet by mouth 2 (two) times daily.  . Cholecalciferol (VITAMIN D3 GUMMIES ADULT PO) Take 2 each by mouth.  . eletriptan (RELPAX) 40 MG tablet TAKE 1 TABLET BY MOUTH AS NEEDED FOR MIGRAINE  . levonorgestrel (MIRENA) 20 MCG/24HR IUD 1 each by Intrauterine route once.  Marland Kitchen levothyroxine (SYNTHROID) 75 MCG tablet Take 75 mcg by mouth daily before breakfast.  . liothyronine (CYTOMEL) 5 MCG tablet Take 10 mcg by mouth in the morning and at bedtime.  Marland Kitchen losartan (COZAAR) 25 MG tablet Take 25 mg by mouth daily.  . Multiple Vitamins-Minerals (MULTIVITAMIN) LIQD Take by mouth.  Marland Kitchen OVER THE COUNTER MEDICATION chrom pico/brindal berry (GARCINIA CAMBOGIA ORAL)  . OVER THE COUNTER MEDICATION Hemp extract for sleep  PRN  . OVER THE COUNTER MEDICATION inonotus obliquus mushroom extract- 2 daily for immune system  . OVER THE COUNTER MEDICATION Life Drops- Vit B Complex  . traMADol (ULTRAM) 50 MG tablet Take 50 mg by mouth every 6 (six) hours as needed.  . triamterene-hydrochlorothiazide (MAXZIDE-25) 37.5-25 MG tablet Take 1 tablet by  mouth daily.  Marland Kitchen zonisamide (ZONEGRAN) 50 MG capsule Take 3 capsules (150 mg total) by mouth daily.   No facility-administered encounter medications on file as of 03/18/2021.    Patient Active Problem List   Diagnosis Date Noted  . Diverticulitis 03/18/2021  . Chest pain 11/20/2018  . PMDD (premenstrual dysphoric disorder) 10/30/2017  . Papillary thyroid carcinoma ([place]) 01/30/2017  . Obesity 10/11/2016  . Menstrual cramps 03/17/2015  . Stress and adjustment reaction 12/04/2014  . Vaginal spotting 02/25/2014  . Atypical chest pain 10/30/2013  . Headache(784.0) 10/30/2013  . Postsurgical hypothyroidism 09/30/2013  . Migraine headache 09/30/2013  . Prediabetes 09/30/2013  . Vitamin D deficiency 09/30/2013  . Hypertension     Past Medical History:  Diagnosis Date  . Allergy   . COVID-19 virus infection 09/2020  . Diverticulitis   . Hypertension   . Migraines    Neurology- Dr. [name] Alar  . Obesity (BMI 30-39.9)   . Ovarian cyst   . Thyroid cancer ([place])    Thyroid    Relevant past medical, surgical, family and social history reviewed and updated as indicated. Interim medical history since our last visit reviewed.  Review of Systems Per HPI unless specifically indicated above     Objective:    BP 132/82   Pulse 75   Temp 98 F (36.7 C)   Ht 5\' 4"  (1.626 m)   Wt 187 lb 9.6 oz (85.1 kg)   SpO2 100%   BMI 32.20 kg/m   Wt Readings from Last 3 Encounters:  03/18/21 187 lb 9.6 oz (85.1 kg)  01/17/21 196 lb (88.9 kg)  10/22/20 198 lb (89.8 kg)    Physical Exam Vitals and nursing note reviewed.  Constitutional:      General: She is not in acute distress.    Appearance: Normal appearance. She is obese. She is not toxic-appearing.  HENT:     Head: Normocephalic and atraumatic.  Eyes:     General: No scleral icterus.    Extraocular Movements: Extraocular movements intact.  Cardiovascular:     Rate and Rhythm: Normal rate and regular rhythm.     Heart sounds: Normal  heart sounds.  Pulmonary:     Effort: Pulmonary effort is normal. No respiratory distress.     Breath sounds: Normal breath sounds. No wheezing, rhonchi or rales.  Abdominal:     General: Abdomen is flat. Bowel sounds are normal. There is no distension.     Tenderness: There is abdominal tenderness (generalized, most pronounced LLQ).  Musculoskeletal:        General: Normal range of motion.     Right lower leg: No edema.     Left lower leg: No edema.  Skin:    General: Skin is warm and dry.     Capillary Refill: Capillary refill takes less than 2 seconds.     Coloration: Skin is not jaundiced or pale.     Findings: No erythema.  Neurological:     Mental Status: She is alert and oriented to person, place, and time.     Gait: Gait normal.  Psychiatric:        Mood and Affect: Mood  normal.        Behavior: Behavior normal.        Thought Content: Thought content normal.        Judgment: Judgment normal.        Assessment & Plan:   Problem List Items Addressed This Visit      Endocrine   Papillary thyroid carcinoma (HCC) (Chronic)    Follows closely with Dr. Buddy Duty in [place] and Dr. Koleen Nimrod in [place], [place].  Next appointment scheduled for April 22 - to discuss PET scan results.  Continue this close collaboration.      Relevant Medications   amoxicillin-clavulanate (AUGMENTIN) 875-125 MG tablet     Other   Diverticulitis - Primary    Acute.  Noted on CT imaging 03/16/2021.  Patient is on appropriate antimicrobial management.  Continue this therapy through completion.  Discussed and encouraged soft diet to help with abdominal pain for new few days.  If symptoms worsen, start clear liquids only.  CT imaging showed mass on right lobe of liver; previously visualized on MRI with [facility] in 2013 which suggested hemangioma.  Recent PET scan did not demonstrate FDG accumulation in this area.  After discussion, patients elects to monitor closely for now.  Patient to return to clinic if  symptoms persist despite antibiotic completion.  If symptoms worsen or with n/v and unable to keep fluids down, blood in stool, or fever, go to ER.          Follow up plan: Return in about 4 weeks (around 04/15/2021) for chronic disease f/u.   30+ minutes spent with patient during encounter today.

## 2021-03-18 NOTE — Assessment & Plan Note (Signed)
Follows closely with Dr. Buddy Duty in [place] and Dr. Koleen Nimrod in [place], [place].  Next appointment scheduled for April 22 - to discuss PET scan results.  Continue this close collaboration.

## 2021-04-01 ENCOUNTER — Other Ambulatory Visit: Payer: Self-pay

## 2021-04-01 MED ORDER — LOSARTAN POTASSIUM 25 MG PO TABS
25.0000 mg | ORAL_TABLET | Freq: Every day | ORAL | 1 refills | Status: DC
Start: 2021-04-01 — End: 2021-08-22

## 2021-04-06 ENCOUNTER — Telehealth: Payer: Self-pay | Admitting: Nurse Practitioner

## 2021-04-06 NOTE — Telephone Encounter (Signed)
Pt called about receiving a bill for $35. She did not have the DOS, but she did have this info  Acct: [id number] Amt: 35.00 Insurance: [place] ID: [id number] Best contact num: [phone]

## 2021-04-15 NOTE — Telephone Encounter (Signed)
Tried calling patient however her voicemail is full.  This outstanding balance is from [place] 03/18/2021 and per her insurance she owes $35 for her copay.

## 2021-05-06 ENCOUNTER — Other Ambulatory Visit: Payer: Self-pay | Admitting: Obstetrics and Gynecology

## 2021-05-06 DIAGNOSIS — R102 Pelvic and perineal pain: Secondary | ICD-10-CM

## 2021-05-09 ENCOUNTER — Other Ambulatory Visit: Payer: Self-pay

## 2021-05-09 ENCOUNTER — Ambulatory Visit (HOSPITAL_BASED_OUTPATIENT_CLINIC_OR_DEPARTMENT_OTHER)
Admission: RE | Admit: 2021-05-09 | Discharge: 2021-05-09 | Disposition: A | Discharge status: Home / Self Care | Payer: 59 | Source: Ambulatory Visit | Attending: Obstetrics and Gynecology | Admitting: Obstetrics and Gynecology

## 2021-05-09 DIAGNOSIS — R102 Pelvic and perineal pain: Secondary | ICD-10-CM | POA: Insufficient documentation

## 2021-05-17 ENCOUNTER — Other Ambulatory Visit: Payer: 59

## 2021-05-18 ENCOUNTER — Telehealth: Payer: Self-pay

## 2021-05-18 ENCOUNTER — Encounter: Payer: Self-pay | Admitting: Nurse Practitioner

## 2021-05-18 DIAGNOSIS — Z8379 Family history of other diseases of the digestive system: Secondary | ICD-10-CM | POA: Insufficient documentation

## 2021-05-18 DIAGNOSIS — R7401 Elevation of levels of liver transaminase levels: Secondary | ICD-10-CM

## 2021-05-18 DIAGNOSIS — K769 Liver disease, unspecified: Secondary | ICD-10-CM | POA: Insufficient documentation

## 2021-05-19 NOTE — Telephone Encounter (Signed)
Please abstract lab results into chart

## 2021-05-19 NOTE — Telephone Encounter (Signed)
I messaged patient via mychart.  Recommend rechecking liver function tests - CMP with GFR, please order.  If ALT remains elevated, we can check an ultrasound of her liver before proceeding with MRI.

## 2021-05-19 NOTE — Telephone Encounter (Signed)
Please abstract results into chart

## 2021-05-20 ENCOUNTER — Other Ambulatory Visit: Payer: 59

## 2021-05-20 ENCOUNTER — Telehealth: Payer: Self-pay

## 2021-05-20 ENCOUNTER — Other Ambulatory Visit: Payer: Self-pay

## 2021-05-20 DIAGNOSIS — R7401 Elevation of levels of liver transaminase levels: Secondary | ICD-10-CM

## 2021-05-20 DIAGNOSIS — K769 Liver disease, unspecified: Secondary | ICD-10-CM

## 2021-05-20 NOTE — Telephone Encounter (Signed)
Pt provide the MRI results from 08/07/2012 without contrast.  Findings were liver is normal in size and contour. Also abdomen pelvis with contrast with iv contrast , no pulmonary noules or significant pulmonary opacities. Forms have been sent to scan

## 2021-05-21 LAB — HEPATIC FUNCTION PANEL
AG Ratio: 1.5 (calc) (ref 1.0–2.5)
ALT: 50 U/L — ABNORMAL HIGH (ref 6–29)
AST: 25 U/L (ref 10–30)
Albumin: 4 g/dL (ref 3.6–5.1)
Alkaline phosphatase (APISO): 54 U/L (ref 31–125)
Bilirubin, Direct: 0.1 mg/dL (ref 0.0–0.2)
Globulin: 2.7 g/dL (calc) (ref 1.9–3.7)
Indirect Bilirubin: 0.3 mg/dL (calc) (ref 0.2–1.2)
Total Bilirubin: 0.4 mg/dL (ref 0.2–1.2)
Total Protein: 6.7 g/dL (ref 6.1–8.1)

## 2021-06-14 ENCOUNTER — Ambulatory Visit
Admission: RE | Admit: 2021-06-14 | Discharge: 2021-06-14 | Disposition: A | Discharge status: Home / Self Care | Payer: 59 | Source: Ambulatory Visit | Attending: Nurse Practitioner | Admitting: Nurse Practitioner

## 2021-06-14 DIAGNOSIS — R7401 Elevation of levels of liver transaminase levels: Secondary | ICD-10-CM

## 2021-06-17 NOTE — Addendum Note (Signed)
Addended by: Noemi Chapel A on: 06/17/2021 08:31 AM   Modules accepted: Orders

## 2021-07-04 ENCOUNTER — Ambulatory Visit
Admission: RE | Admit: 2021-07-04 | Discharge: 2021-07-04 | Disposition: A | Discharge status: Home / Self Care | Payer: 59 | Source: Ambulatory Visit | Attending: Nurse Practitioner | Admitting: Nurse Practitioner

## 2021-07-04 DIAGNOSIS — K769 Liver disease, unspecified: Secondary | ICD-10-CM

## 2021-07-04 MED ORDER — GADOBENATE DIMEGLUMINE 529 MG/ML IV SOLN
18.0000 mL | Freq: Once | INTRAVENOUS | Status: AC | PRN
  Administered 2021-07-04: 18 mL via INTRAVENOUS

## 2021-07-07 ENCOUNTER — Telehealth: Payer: Self-pay

## 2021-07-07 NOTE — Telephone Encounter (Signed)
Mychart message sent.

## 2021-07-21 ENCOUNTER — Other Ambulatory Visit: Payer: Self-pay

## 2021-07-21 ENCOUNTER — Ambulatory Visit: Payer: 59 | Admitting: Nurse Practitioner

## 2021-07-21 ENCOUNTER — Encounter: Payer: Self-pay | Admitting: Nurse Practitioner

## 2021-07-21 VITALS — BP 166/122 | HR 86 | Temp 98.2°F | Ht 64.0 in | Wt 199.0 lb

## 2021-07-21 DIAGNOSIS — I10 Essential (primary) hypertension: Secondary | ICD-10-CM

## 2021-07-21 DIAGNOSIS — E559 Vitamin D deficiency, unspecified: Secondary | ICD-10-CM

## 2021-07-21 DIAGNOSIS — E890 Postprocedural hypothyroidism: Secondary | ICD-10-CM

## 2021-07-21 DIAGNOSIS — Z13220 Encounter for screening for lipoid disorders: Secondary | ICD-10-CM

## 2021-07-21 DIAGNOSIS — Z1159 Encounter for screening for other viral diseases: Secondary | ICD-10-CM

## 2021-07-21 DIAGNOSIS — R7303 Prediabetes: Secondary | ICD-10-CM

## 2021-07-21 DIAGNOSIS — Z136 Encounter for screening for cardiovascular disorders: Secondary | ICD-10-CM

## 2021-07-21 MED ORDER — TRIAMTERENE-HCTZ 37.5-25 MG PO TABS: 0.5000 | ORAL_TABLET | Freq: Every day | ORAL | 1 refills | Status: DC

## 2021-07-21 NOTE — Progress Notes (Signed)
Subjective:    Patient ID: Alexandria [name], female    DOB: 1981/05/14, 40 y.o.   MRN: [id number]  HPI: Alexandria [name] is a 40 y.o. female presenting for elevated blood pressure.  Chief Complaint  Patient presents with   Hypertension    Feeling swelling in hands and feet with fatigue. Being referred to kidney specialist by endo   HYPERTENSION Stopped taking Maxzide 37.5-25 mg after she had lost some weight and blood pressure was dropping.  She has noticed her blood pressure has been high at home and she is thinking she may need to start back on the medication because she has gained some weight back. Hypertension status: uncontrolled  Satisfied with current treatment? no Duration of hypertension: chronic BP monitoring frequency:  daily BP range: 160s/90s BP medication side effects:  no Medication compliance: excellent Aspirin: no Recurrent headaches: no Visual changes: no Palpitations: no Dyspnea: no Chest pain: no Lower extremity edema: no Dizzy/lightheaded: no  Reports diet has changed; has cravings for sugar.    Walks - stays active in the house.   Water - drinks plenty of water.   Reports kidney cyst and elevated insulin - endocrinologist is sending to kidney specialist.    Vitamin D - taking 2 gummies; 2000 IU daily.  Allergies  Allergen Reactions   Penicillins Other (See Comments)    Has not had since child, thinks she had reaction to it then    Outpatient Encounter Medications as of 07/21/2021  Medication Sig   Cholecalciferol (VITAMIN D3 GUMMIES ADULT PO) Take 2 each by mouth.   eletriptan (RELPAX) 40 MG tablet TAKE 1 TABLET BY MOUTH AS NEEDED FOR MIGRAINE   levothyroxine (SYNTHROID) 88 MCG tablet Take 88 mcg by mouth daily before breakfast.   liothyronine (CYTOMEL) 5 MCG tablet Take 10 mcg by mouth in the morning and at bedtime.   losartan (COZAAR) 25 MG tablet Take 1 tablet (25 mg total) by mouth daily.   Multiple Vitamins-Minerals (MULTIVITAMIN) LIQD  Take by mouth.   OVER THE COUNTER MEDICATION chrom pico/brindal berry (GARCINIA CAMBOGIA ORAL)   OVER THE COUNTER MEDICATION Hemp extract for sleep PRN   OVER THE COUNTER MEDICATION inonotus obliquus mushroom extract- 2 daily for immune system   OVER THE COUNTER MEDICATION Life Drops- Vit B Complex   traMADol (ULTRAM) 50 MG tablet Take 50 mg by mouth every 6 (six) hours as needed.   [DISCONTINUED] zonisamide (ZONEGRAN) 50 MG capsule Take 3 capsules (150 mg total) by mouth daily.   triamterene-hydrochlorothiazide (MAXZIDE-25) 37.5-25 MG tablet Take 0.5 tablets by mouth daily.   [DISCONTINUED] acetaminophen (TYLENOL) 325 MG tablet Take 2 tablets (650 mg total) by mouth every 6 (six) hours as needed for mild pain or headache.   [DISCONTINUED] amoxicillin-clavulanate (AUGMENTIN) 875-125 MG tablet Take 1 tablet by mouth 2 (two) times daily.   [DISCONTINUED] levonorgestrel (MIRENA) 20 MCG/24HR IUD 1 each by Intrauterine route once.   [DISCONTINUED] levothyroxine (SYNTHROID) 75 MCG tablet Take 88 mcg by mouth daily before breakfast.   [DISCONTINUED] triamterene-hydrochlorothiazide (MAXZIDE-25) 37.5-25 MG tablet Take 1 tablet by mouth daily. (Patient not taking: Reported on 07/21/2021)   No facility-administered encounter medications on file as of 07/21/2021.    Patient Active Problem List   Diagnosis Date Noted   Lesion of liver 05/18/2021   Family history of liver disease 05/18/2021   Diverticulitis 03/18/2021   Chest pain 11/20/2018   PMDD (premenstrual dysphoric disorder) 10/30/2017   Papillary thyroid carcinoma ([place]) 01/30/2017  Obesity 10/11/2016   Menstrual cramps 03/17/2015   Stress and adjustment reaction 12/04/2014   Vaginal spotting 02/25/2014   Atypical chest pain 10/30/2013   Headache(784.0) 10/30/2013   Postsurgical hypothyroidism 09/30/2013   Migraine headache 09/30/2013   Prediabetes 09/30/2013   Vitamin D deficiency 09/30/2013   Hypertension     Past Medical History:   Diagnosis Date   Allergy    COVID-19 virus infection 09/2020   Diverticulitis    Hypertension    Migraines    Neurology- Dr. [name] Alar   Obesity (BMI 30-39.9)    Ovarian cyst    Thyroid cancer ([place])    Thyroid    Relevant past medical, surgical, family and social history reviewed and updated as indicated. Interim medical history since our last visit reviewed.  Review of Systems Per HPI unless specifically indicated above     Objective:    BP (!) 166/122   Pulse 86   Temp 98.2 F (36.8 C)   Ht '5\' 4"'$  (1.626 m)   Wt 199 lb (90.3 kg)   LMP 06/29/2021 Comment: . stop taking oral pills as well  SpO2 98%   BMI 34.16 kg/m   Wt Readings from Last 3 Encounters:  07/21/21 199 lb (90.3 kg)  03/18/21 187 lb 9.6 oz (85.1 kg)  01/17/21 196 lb (88.9 kg)    Physical Exam Vitals and nursing note reviewed.  Constitutional:      General: She is not in acute distress.    Appearance: Normal appearance. She is obese. She is not toxic-appearing.  HENT:     Head: Normocephalic and atraumatic.  Eyes:     General: No scleral icterus.    Extraocular Movements: Extraocular movements intact.  Neck:     Vascular: No carotid bruit.  Cardiovascular:     Rate and Rhythm: Normal rate and regular rhythm.     Heart sounds: Normal heart sounds. No murmur heard. Pulmonary:     Effort: Pulmonary effort is normal. No respiratory distress.     Breath sounds: Normal breath sounds. No wheezing, rhonchi or rales.  Musculoskeletal:     Cervical back: Normal range of motion.  Skin:    General: Skin is warm and dry.     Capillary Refill: Capillary refill takes less than 2 seconds.     Coloration: Skin is not jaundiced.     Findings: No bruising.  Neurological:     Mental Status: She is alert and oriented to person, place, and time.     Motor: No weakness.     Gait: Gait normal.  Psychiatric:        Mood and Affect: Mood normal.        Behavior: Behavior normal.        Thought Content: Thought  content normal.        Judgment: Judgment normal.      Assessment & Plan:   Problem List Items Addressed This Visit       Cardiovascular and Mediastinum   Hypertension - Primary    Chronic, uncontrolled.  Will resume Maxzide 37.5-25 and recheck kidney function and blood pressure in 1 month.  Encouraged decreasing carbohydrates, increase physical activity.  Goal is 30 minutes 5 times weekly.        Relevant Medications   triamterene-hydrochlorothiazide (MAXZIDE-25) 37.5-25 MG tablet   Other Relevant Orders   CBC with Differential/Platelet (Completed)     Endocrine   Postsurgical hypothyroidism    Follows closely with endocrinology - continue this collaboration.  Relevant Medications   levothyroxine (SYNTHROID) 88 MCG tablet     Other   Vitamin D deficiency    Due for recheck - will recheck today and continue OTC supplementation.       Relevant Orders   VITAMIN D 25 Hydroxy (Vit-D Deficiency, Fractures) (Completed)   Other Visit Diagnoses     Encounter for lipid screening for cardiovascular disease       Relevant Orders   Lipid panel (Completed)   Need for hepatitis C screening test       Relevant Orders   Hepatitis C antibody (Completed)        Follow up plan: Return for 1 month BP f/u.

## 2021-07-22 ENCOUNTER — Encounter: Payer: Self-pay | Admitting: Nurse Practitioner

## 2021-07-22 LAB — CBC WITH DIFFERENTIAL/PLATELET
Absolute Monocytes: 442 cells/uL (ref 200–950)
Basophils Absolute: 19 cells/uL (ref 0–200)
Basophils Relative: 0.4 %
Eosinophils Absolute: 72 cells/uL (ref 15–500)
Eosinophils Relative: 1.5 %
HCT: 44 % (ref 35.0–45.0)
Hemoglobin: 14.2 g/dL (ref 11.7–15.5)
Lymphs Abs: 2266 cells/uL (ref 850–3900)
MCH: 28 pg (ref 27.0–33.0)
MCHC: 32.3 g/dL (ref 32.0–36.0)
MCV: 86.8 fL (ref 80.0–100.0)
MPV: 11.2 fL (ref 7.5–12.5)
Monocytes Relative: 9.2 %
Neutro Abs: 2002 cells/uL (ref 1500–7800)
Neutrophils Relative %: 41.7 %
Platelets: 242 10*3/uL (ref 140–400)
RBC: 5.07 10*6/uL (ref 3.80–5.10)
RDW: 11.7 % (ref 11.0–15.0)
Total Lymphocyte: 47.2 %
WBC: 4.8 10*3/uL (ref 3.8–10.8)

## 2021-07-22 LAB — LIPID PANEL
Cholesterol: 141 mg/dL (ref <200)
HDL: 54 mg/dL (ref >=50)
LDL Cholesterol (Calc): 73 mg/dL (calc)
Non-HDL Cholesterol (Calc): 87 mg/dL (calc) (ref <130)
Total CHOL/HDL Ratio: 2.6 (calc) (ref <5.0)
Triglycerides: 62 mg/dL (ref <150)

## 2021-07-22 LAB — VITAMIN D 25 HYDROXY (VIT D DEFICIENCY, FRACTURES): Vit D, 25-Hydroxy: 65 ng/mL (ref 30–100)

## 2021-07-22 LAB — HEPATITIS C ANTIBODY
Hepatitis C Ab: NONREACTIVE
SIGNAL TO CUT-OFF: 0.02 (ref <1.00)

## 2021-07-23 NOTE — Assessment & Plan Note (Signed)
Chronic, uncontrolled.  Will resume Maxzide 37.5-25 and recheck kidney function and blood pressure in 1 month.  Encouraged decreasing carbohydrates, increase physical activity.  Goal is 30 minutes 5 times weekly.

## 2021-07-23 NOTE — Assessment & Plan Note (Signed)
Due for recheck - will recheck today and continue OTC supplementation.

## 2021-07-23 NOTE — Assessment & Plan Note (Signed)
Follows closely with endocrinology - continue this collaboration.

## 2021-07-26 ENCOUNTER — Emergency Department
Admission: EM | Admit: 2021-07-26 | Discharge: 2021-07-26 | Disposition: A | Discharge status: Home / Self Care | Payer: 59 | Attending: Emergency Medicine | Admitting: Emergency Medicine

## 2021-07-26 ENCOUNTER — Other Ambulatory Visit: Payer: Self-pay

## 2021-07-26 DIAGNOSIS — I10 Essential (primary) hypertension: Secondary | ICD-10-CM | POA: Diagnosis not present

## 2021-07-26 DIAGNOSIS — R002 Palpitations: Secondary | ICD-10-CM | POA: Insufficient documentation

## 2021-07-26 DIAGNOSIS — R519 Headache, unspecified: Secondary | ICD-10-CM | POA: Insufficient documentation

## 2021-07-26 DIAGNOSIS — E039 Hypothyroidism, unspecified: Secondary | ICD-10-CM | POA: Insufficient documentation

## 2021-07-26 DIAGNOSIS — R030 Elevated blood-pressure reading, without diagnosis of hypertension: Secondary | ICD-10-CM | POA: Diagnosis present

## 2021-07-26 DIAGNOSIS — Z85850 Personal history of malignant neoplasm of thyroid: Secondary | ICD-10-CM | POA: Insufficient documentation

## 2021-07-26 DIAGNOSIS — Z79899 Other long term (current) drug therapy: Secondary | ICD-10-CM | POA: Diagnosis not present

## 2021-07-26 DIAGNOSIS — Z8616 Personal history of COVID-19: Secondary | ICD-10-CM | POA: Diagnosis not present

## 2021-07-26 LAB — HCG, QUANTITATIVE, PREGNANCY: hCG, Beta Chain, Quant, S: <1 m[IU]/mL (ref <5)

## 2021-07-26 LAB — BASIC METABOLIC PANEL
Anion gap: 7 (ref 5–15)
BUN: 15 mg/dL (ref 6–20)
CO2: 25 mmol/L (ref 22–32)
Calcium: 9.2 mg/dL (ref 8.9–10.3)
Chloride: 104 mmol/L (ref 98–111)
Creatinine, Ser: 0.98 mg/dL (ref 0.44–1.00)
GFR, Estimated: >60 mL/min (ref >60)
Glucose, Bld: 116 mg/dL — ABNORMAL HIGH (ref 70–99)
Potassium: 3.7 mmol/L (ref 3.5–5.1)
Sodium: 136 mmol/L (ref 135–145)

## 2021-07-26 LAB — CBC
HCT: 44.5 % (ref 36.0–46.0)
Hemoglobin: 15.3 g/dL — ABNORMAL HIGH (ref 12.0–15.0)
MCH: 29.1 pg (ref 26.0–34.0)
MCHC: 34.4 g/dL (ref 30.0–36.0)
MCV: 84.8 fL (ref 80.0–100.0)
Platelets: 265 10*3/uL (ref 150–400)
RBC: 5.25 MIL/uL — ABNORMAL HIGH (ref 3.87–5.11)
RDW: 11.6 % (ref 11.5–15.5)
WBC: 3.4 10*3/uL — ABNORMAL LOW (ref 4.0–10.5)
nRBC: 0 % (ref 0.0–0.2)

## 2021-07-26 LAB — POC URINE PREG, ED: Preg Test, Ur: NEGATIVE

## 2021-07-26 LAB — TROPONIN I (HIGH SENSITIVITY): Troponin I (High Sensitivity): 6 ng/L (ref <18)

## 2021-07-26 MED ORDER — CLONIDINE HCL 0.1 MG PO TABS: ORAL_TABLET | ORAL | 0 refills | Status: DC

## 2021-07-26 MED ORDER — CLONIDINE HCL 0.1 MG PO TABS
0.1000 mg | ORAL_TABLET | Freq: Once | ORAL | Status: AC
  Administered 2021-07-26: 0.1 mg via ORAL
  Filled 2021-07-26: qty 1

## 2021-07-26 NOTE — ED Provider Notes (Signed)
[facility] Emergency Department Provider Note  Time seen: 2:25 PM  I have reviewed the triage vital signs and the nursing notes.   HISTORY  Chief Complaint Hypertension   HPI Alexandria [name] is a 40 y.o. female past medical history of hypertension, migraines, presents to the emergency department for high blood pressure.  According to the patient over the past month or so she has consistently had high blood pressures around 160/110.  Patient takes losartan 25 as well as Maxide half tablet.  Has a log of her blood pressures at home.  States she feels like her blood pressure is worsening and not improving, also has been experiencing headaches with her blood pressure.  States a history of migraines including earlier today but she took her migraine medication and now feels better from that aspect.  Does state palpitations at night but denies any chest pain.  No shortness of breath.  No nausea vomiting or diarrhea.   Past Medical History:  Diagnosis Date   Allergy    COVID-19 virus infection 09/2020   Diverticulitis    Hypertension    Migraines    Neurology- Dr. [name] Alar   Obesity (BMI 30-39.9)    Ovarian cyst    Thyroid cancer [facility])    Thyroid    Patient Active Problem List   Diagnosis Date Noted   Lesion of liver 05/18/2021   Family history of liver disease 05/18/2021   Diverticulitis 03/18/2021   Chest pain 11/20/2018   PMDD (premenstrual dysphoric disorder) 10/30/2017   Papillary thyroid carcinoma ([place]) 01/30/2017   Obesity 10/11/2016   Menstrual cramps 03/17/2015   Stress and adjustment reaction 12/04/2014   Vaginal spotting 02/25/2014   Atypical chest pain 10/30/2013   Headache(784.0) 10/30/2013   Postsurgical hypothyroidism 09/30/2013   Migraine headache 09/30/2013   Prediabetes 09/30/2013   Vitamin D deficiency 09/30/2013   Hypertension     Past Surgical History:  Procedure Laterality Date   GALLBLADDER SURGERY     left leg surgery      parathyroid gland     thyroid removed      Prior to Admission medications   Medication Sig Start Date End Date Taking? Authorizing Provider  Cholecalciferol (VITAMIN D3 GUMMIES ADULT PO) Take 2 each by mouth.    [provider]  eletriptan (RELPAX) 40 MG tablet TAKE 1 TABLET BY MOUTH AS NEEDED FOR MIGRAINE 08/16/18   [provider]  levothyroxine (SYNTHROID) 88 MCG tablet Take 88 mcg by mouth daily before breakfast.    [provider]  liothyronine (CYTOMEL) 5 MCG tablet Take 10 mcg by mouth in the morning and at bedtime.    [provider]  losartan (COZAAR) 25 MG tablet Take 1 tablet (25 mg total) by mouth daily. 04/01/21   Eulogio Bear, NP  Multiple Vitamins-Minerals (MULTIVITAMIN) LIQD Take by mouth.    [provider]  OVER THE COUNTER MEDICATION chrom pico/brindal berry (GARCINIA CAMBOGIA ORAL)    [provider]  OVER THE COUNTER MEDICATION Hemp extract for sleep PRN    [provider]  OVER THE COUNTER MEDICATION inonotus obliquus mushroom extract- 2 daily for immune system    [provider]  OVER THE COUNTER MEDICATION Life Drops- Vit B Complex    [provider]  traMADol (ULTRAM) 50 MG tablet Take 50 mg by mouth every 6 (six) hours as needed. 03/15/21   [provider]  triamterene-hydrochlorothiazide (MAXZIDE-25) 37.5-25 MG tablet Take 0.5 tablets by mouth daily.  07/21/21   Eulogio Bear, NP    Allergies  Allergen Reactions   Penicillins Other (See Comments)    Has not had since child, thinks she had reaction to it then    Family History  Problem Relation Age of Onset   Hypertension Mother    Diabetes Mother    Hypertension Paternal Aunt    Stroke Paternal Aunt    Hypertension Paternal Uncle    Hypertension Maternal Grandmother    Cancer Maternal Grandfather    Diabetes Maternal Grandfather    Parkinsonism Maternal Grandfather    Alzheimer's disease Maternal Grandfather     Liver disease Sister    Anxiety disorder Paternal Uncle     Social History Social History   Tobacco Use   Smoking status: Never   Smokeless tobacco: Never  Vaping Use   Vaping Use: Never used  Substance Use Topics   Alcohol use: No   Drug use: No    Review of Systems Constitutional: Negative for fever. Cardiovascular: Negative for chest pain.  Positive for palpitations Respiratory: Negative for shortness of breath. Gastrointestinal: Negative for abdominal pain, vomiting  Musculoskeletal: Negative for musculoskeletal complaints Neurological: Intermittent headaches All other ROS negative  ____________________________________________   PHYSICAL EXAM:  VITAL SIGNS: ED Triage Vitals  Enc Vitals Group     BP 07/26/21 1349 (!) 158/116     Pulse Rate 07/26/21 1349 90     Resp 07/26/21 1349 17     Temp 07/26/21 1349 98.4 F (36.9 C)     Temp Source 07/26/21 1349 Oral     SpO2 07/26/21 1349 98 %     Weight 07/26/21 1357 198 lb 6.6 oz (90 kg)     Height 07/26/21 1357 '5\' 4"'$  (1.626 m)     Head Circumference --      Peak Flow --      Pain Score 07/26/21 1357 5     Pain Loc --      Pain Edu? --      Excl. in [place]? --    Constitutional: Alert and oriented. Well appearing and in no distress. Eyes: Normal exam ENT      Head: Normocephalic and atraumatic.      Mouth/Throat: Mucous membranes are moist. Cardiovascular: Normal rate, regular rhythm. Respiratory: Normal respiratory effort without tachypnea nor retractions. Breath sounds are clear  Gastrointestinal: Soft and nontender. No distention. Musculoskeletal: Nontender with normal range of motion in all extremities.  Neurologic:  Normal speech and language. No gross focal neurologic deficits Skin:  Skin is warm, dry and intact.  Psychiatric: Mood and affect are normal.   ____________________________________________    EKG  EKG viewed and interpreted by myself shows a normal sinus rhythm 85 bpm with a narrow QRS,  normal axis, normal intervals, no concerning ST changes.  ____________________________________________    INITIAL IMPRESSION / ASSESSMENT AND PLAN / ED COURSE  Pertinent labs & imaging results that were available during my care of the patient were reviewed by me and considered in my medical decision making (see chart for details).   Patient presents to the emergency department for elevated blood pressure and intermittent headaches and palpitations.  Overall the patient appears well, no distress.  Has a reassuring physical exam.  Blood pressure remains fairly elevated 158/116.  Patient's blood pressures at home have remained greater than [id number] diastolic.  We will check labs including cardiac enzyme.  Chemistry is pending as well.  We will dose a one-time dose of 0.1 mg  of clonidine and monitor for diastolic improvement.  We will increase the patient's home Maxide from half tablet to a full tablet in addition to her losartan.  Patient will follow up with her doctor regarding her hypertension.  Patient is feeling much better.  Lab work is reassuring.  Blood pressures down to 140/100.  We will discharge the patient home with PCP follow-up.  Short course of clonidine to be used as needed for blood pressure greater than 99991111 systolic or A999333 diastolic.  Alexandria [name] was evaluated in Emergency Department on 07/26/2021 for the symptoms described in the history of present illness. She was evaluated in the context of the global COVID-19 pandemic, which necessitated consideration that the patient might be at risk for infection with the SARS-CoV-2 virus that causes COVID-19. Institutional protocols and algorithms that pertain to the evaluation of patients at risk for COVID-19 are in a state of rapid change based on information released by regulatory bodies including the CDC and federal and state organizations. These policies and algorithms were followed during the patient's care in the  ED.  ____________________________________________   FINAL CLINICAL IMPRESSION(S) / ED DIAGNOSES  Hypertension   Harvest Dark, MD 07/26/21 1544

## 2021-07-26 NOTE — ED Triage Notes (Signed)
Pt to ED for HTN, and headache with heart palpitations for a few weeks.  Hx migraines and htn.  Just started back on bp meds  States her head feels better after taking headache meds PTA

## 2021-08-03 ENCOUNTER — Telehealth: Payer: Self-pay

## 2021-08-03 ENCOUNTER — Encounter: Payer: Self-pay | Admitting: Nurse Practitioner

## 2021-08-03 NOTE — Telephone Encounter (Addendum)
Pt called to inform of headache. Went to ER. Told to take full tablet of maxide instead of half. Pt will upload bp readings for your review reveiw

## 2021-08-03 NOTE — Telephone Encounter (Signed)
Noted.  Awaiting BP readings.

## 2021-08-22 ENCOUNTER — Ambulatory Visit (INDEPENDENT_AMBULATORY_CARE_PROVIDER_SITE_OTHER): Payer: 59 | Admitting: Nurse Practitioner

## 2021-08-22 ENCOUNTER — Other Ambulatory Visit: Payer: Self-pay

## 2021-08-22 ENCOUNTER — Encounter: Payer: Self-pay | Admitting: Nurse Practitioner

## 2021-08-22 VITALS — BP 152/118 | HR 75 | Temp 98.0°F | Wt 196.2 lb

## 2021-08-22 DIAGNOSIS — I10 Essential (primary) hypertension: Secondary | ICD-10-CM | POA: Diagnosis not present

## 2021-08-22 MED ORDER — TRIAMTERENE-HCTZ 37.5-25 MG PO TABS: 1.0000 | ORAL_TABLET | Freq: Every day | ORAL | 1 refills | Status: DC

## 2021-08-22 MED ORDER — LOSARTAN POTASSIUM 25 MG PO TABS: 25.0000 mg | ORAL_TABLET | Freq: Two times a day (BID) | ORAL | 1 refills | Status: DC

## 2021-08-22 MED ORDER — LOSARTAN POTASSIUM 25 MG PO TABS: 25.0000 mg | ORAL_TABLET | Freq: Every day | ORAL | 1 refills | Status: DC

## 2021-08-22 NOTE — Assessment & Plan Note (Addendum)
Chronic.  Unclear if elevated blood pressure is related to headache, or if headache is related to elevated blood pressure.  Continue collaboration with neurology for headaches.  Regardless, blood pressure was not at goal when she was checking it prior to stopping zonisamide.  Goal is less than 130/80.  Continue Maxide and increase losartan to 25 mg twice daily.  Follow-up in 1 month.  We will check kidney function with electrolytes today.

## 2021-08-22 NOTE — Progress Notes (Signed)
Subjective:    Patient ID: Alexandria [name], female    DOB: January 28, 1981, 40 y.o.   MRN: [id number]  HPI: Alexandria [name] is a 40 y.o. female presenting for hypertension.  Chief Complaint  Patient presents with   Follow-up   HYPERTENSION Has been taking whole tablet of Maxzide and losartan 25 mg daily.  Has not taken clonidine very much, this makes her sleepy.  Recently stopped taking preventative migraine medication due to fear of side effects and has been having more migraines lately.  In place, has started taking magnesium oxide, frequently forgets evening dose.  Hypertension status: better  Satisfied with current treatment? no Duration of hypertension: chronic BP monitoring frequency:  not checking BP range: 142/92 last week at MD office, not checking regularly at home BP medication side effects:  no Medication compliance: excellent Aspirin: no Recurrent headaches: yes; recently stopped zonesimide and has been taking Magnesium Oxide  Visual changes: no Palpitations: no Dyspnea: no Chest pain: no Lower extremity edema: no Dizzy/lightheaded: no  Allergies  Allergen Reactions   Penicillins Other (See Comments)    Has not had since child, thinks she had reaction to it then    Outpatient Encounter Medications as of 08/22/2021  Medication Sig   Cholecalciferol (VITAMIN D3 GUMMIES ADULT PO) Take 2 each by mouth.   cloNIDine (CATAPRES) 0.1 MG tablet Please take 1 tablet orally daily as needed for blood pressure greater than 180/110.   eletriptan (RELPAX) 40 MG tablet TAKE 1 TABLET BY MOUTH AS NEEDED FOR MIGRAINE   levothyroxine (SYNTHROID) 88 MCG tablet Take 88 mcg by mouth daily before breakfast.   liothyronine (CYTOMEL) 5 MCG tablet Take 10 mcg by mouth in the morning and at bedtime.   losartan (COZAAR) 25 MG tablet Take 1 tablet (25 mg total) by mouth 2 (two) times daily.   Multiple Vitamins-Minerals (MULTIVITAMIN) LIQD Take by mouth.   OVER THE COUNTER MEDICATION chrom  pico/brindal berry (GARCINIA CAMBOGIA ORAL)   OVER THE COUNTER MEDICATION Hemp extract for sleep PRN   OVER THE COUNTER MEDICATION inonotus obliquus mushroom extract- 2 daily for immune system   OVER THE COUNTER MEDICATION Life Drops- Vit B Complex   triamterene-hydrochlorothiazide (MAXZIDE-25) 37.5-25 MG tablet Take 1 tablet by mouth daily.   [DISCONTINUED] losartan (COZAAR) 25 MG tablet Take 1 tablet (25 mg total) by mouth daily.   [DISCONTINUED] losartan (COZAAR) 25 MG tablet Take 1 tablet (25 mg total) by mouth daily.   [DISCONTINUED] traMADol (ULTRAM) 50 MG tablet Take 50 mg by mouth every 6 (six) hours as needed.   [DISCONTINUED] triamterene-hydrochlorothiazide (MAXZIDE-25) 37.5-25 MG tablet Take 0.5 tablets by mouth daily.   No facility-administered encounter medications on file as of 08/22/2021.    Patient Active Problem List   Diagnosis Date Noted   Lesion of liver 05/18/2021   Family history of liver disease 05/18/2021   Diverticulitis 03/18/2021   Chest pain 11/20/2018   PMDD (premenstrual dysphoric disorder) 10/30/2017   Papillary thyroid carcinoma ([place]) 01/30/2017   Obesity 10/11/2016   Menstrual cramps 03/17/2015   Stress and adjustment reaction 12/04/2014   Vaginal spotting 02/25/2014   Atypical chest pain 10/30/2013   Headache(784.0) 10/30/2013   Postsurgical hypothyroidism 09/30/2013   Migraine headache 09/30/2013   Prediabetes 09/30/2013   Vitamin D deficiency 09/30/2013   Hypertension     Past Medical History:  Diagnosis Date   Allergy    COVID-19 virus infection 09/2020   Diverticulitis    Hypertension  Migraines    Neurology- Dr. [name] Alar   Obesity (BMI 30-39.9)    Ovarian cyst    Thyroid cancer ([place])    Thyroid    Relevant past medical, surgical, family and social history reviewed and updated as indicated. Interim medical history since our last visit reviewed.  Review of Systems Per HPI unless specifically indicated above     Objective:     BP (!) 152/118   Pulse 75   Temp 98 F (36.7 C) (Oral)   Wt 196 lb 3.2 oz (89 kg)   LMP 07/26/2021 Comment: . stop taking oral pills as well  SpO2 98%   BMI 33.68 kg/m   Wt Readings from Last 3 Encounters:  08/22/21 196 lb 3.2 oz (89 kg)  07/26/21 198 lb 6.6 oz (90 kg)  07/21/21 199 lb (90.3 kg)    Physical Exam Vitals and nursing note reviewed.  Constitutional:      General: She is not in acute distress.    Appearance: Normal appearance. She is obese. She is not toxic-appearing.  HENT:     Head: Normocephalic and atraumatic.  Eyes:     General: No scleral icterus.    Extraocular Movements: Extraocular movements intact.  Neck:     Vascular: No carotid bruit.  Cardiovascular:     Rate and Rhythm: Normal rate and regular rhythm.     Heart sounds: Normal heart sounds. No murmur heard. Pulmonary:     Effort: Pulmonary effort is normal. No respiratory distress.     Breath sounds: Normal breath sounds. No wheezing, rhonchi or rales.  Musculoskeletal:     Cervical back: Normal range of motion.  Skin:    General: Skin is warm and dry.     Capillary Refill: Capillary refill takes less than 2 seconds.     Coloration: Skin is not jaundiced.     Findings: No bruising.  Neurological:     Mental Status: She is alert and oriented to person, place, and time.     Motor: No weakness.     Gait: Gait normal.  Psychiatric:        Mood and Affect: Mood normal.        Behavior: Behavior normal.        Thought Content: Thought content normal.        Judgment: Judgment normal.      Assessment & Plan:   Problem List Items Addressed This Visit       Cardiovascular and Mediastinum   Hypertension - Primary    Chronic.  Unclear if elevated blood pressure is related to headache, or if headache is related to elevated blood pressure.  Regardless, blood pressure was not at goal when she was checking it.  Goal is less than 130/80.  Continue Maxide and increase losartan to 25 mg twice daily.   Follow-up in 1 month.  We will check kidney function with electrolytes today.      Relevant Medications   triamterene-hydrochlorothiazide (MAXZIDE-25) 37.5-25 MG tablet   losartan (COZAAR) 25 MG tablet   Other Relevant Orders   BASIC METABOLIC PANEL WITH GFR     Follow up plan: Return in about 4 weeks (around 09/19/2021) for Blood pressure follow-up.

## 2021-08-23 LAB — BASIC METABOLIC PANEL WITH GFR
BUN/Creatinine Ratio: 12 (calc) (ref 6–22)
BUN: 12 mg/dL (ref 7–25)
CO2: 29 mmol/L (ref 20–32)
Calcium: 9.9 mg/dL (ref 8.6–10.2)
Chloride: 102 mmol/L (ref 98–110)
Creat: 1.01 mg/dL — ABNORMAL HIGH (ref 0.50–0.99)
Glucose, Bld: 85 mg/dL (ref 65–99)
Potassium: 4 mmol/L (ref 3.5–5.3)
Sodium: 138 mmol/L (ref 135–146)
eGFR: 71 mL/min/{1.73_m2} (ref >=60)

## 2021-08-25 ENCOUNTER — Telehealth: Payer: Self-pay | Admitting: Nurse Practitioner

## 2021-09-10 ENCOUNTER — Encounter: Payer: Self-pay | Admitting: Nurse Practitioner

## 2021-09-12 ENCOUNTER — Other Ambulatory Visit: Payer: Self-pay

## 2021-09-12 DIAGNOSIS — I10 Essential (primary) hypertension: Secondary | ICD-10-CM

## 2021-09-12 MED ORDER — TRIAMTERENE-HCTZ 37.5-25 MG PO TABS
1.0000 | ORAL_TABLET | Freq: Every day | ORAL | 1 refills | Status: DC
Start: 2021-09-12 — End: 2022-03-05

## 2021-09-21 ENCOUNTER — Ambulatory Visit: Payer: Self-pay | Admitting: Nurse Practitioner

## 2021-09-28 ENCOUNTER — Other Ambulatory Visit: Payer: Self-pay

## 2021-09-28 ENCOUNTER — Encounter: Payer: Self-pay | Admitting: Nurse Practitioner

## 2021-09-28 ENCOUNTER — Ambulatory Visit (INDEPENDENT_AMBULATORY_CARE_PROVIDER_SITE_OTHER): Payer: 59 | Admitting: Nurse Practitioner

## 2021-09-28 VITALS — BP 134/90 | HR 82 | Temp 98.2°F | Resp 16 | Ht 64.0 in | Wt 198.0 lb

## 2021-09-28 DIAGNOSIS — I10 Essential (primary) hypertension: Secondary | ICD-10-CM

## 2021-09-28 MED ORDER — LOSARTAN POTASSIUM 50 MG PO TABS: 50.0000 mg | ORAL_TABLET | Freq: Two times a day (BID) | ORAL | 1 refills | Status: DC

## 2021-09-28 NOTE — Progress Notes (Signed)
Subjective:    Patient ID: Alexandria [name], female    DOB: [birth year], 40 y.o.   MRN: [id number]  HPI: Alexandria [name] is a 40 y.o. female presenting for blood pressure follow up.  Chief Complaint  Patient presents with   HTN   HYPERTENSION Currently taking triamterene-HCTZ 27.5-25 and losartan 25 mg twice daily.   Under a lot of stress; husband is transitioning to being self-employed, friend recently passed away.  Migraines have been a bit better.  She has been struggling with taking the night time dose of certain medications.   Hypertension status:  better, remains slightly elevated   BP monitoring frequency:  rarely BP range: 140s/100s Medication compliance: excellent Aspirin: no Recurrent headaches: yes; better than last visit Visual changes: no Palpitations: no Dyspnea: no Chest pain: no Lower extremity edema: no Dizzy/lightheaded: no  Goes to see [employer] in next couple of weeks.   Allergies  Allergen Reactions   Penicillins Other (See Comments)    Has not had since child, thinks she had reaction to it then    Outpatient Encounter Medications as of 09/28/2021  Medication Sig   Cholecalciferol (VITAMIN D3 GUMMIES ADULT PO) Take 2 each by mouth.   eletriptan (RELPAX) 40 MG tablet TAKE 1 TABLET BY MOUTH AS NEEDED FOR MIGRAINE   levothyroxine (SYNTHROID) 88 MCG tablet Take 88 mcg by mouth daily before breakfast.   liothyronine (CYTOMEL) 5 MCG tablet Take 10 mcg by mouth in the morning and at bedtime.   losartan (COZAAR) 50 MG tablet Take 1 tablet (50 mg total) by mouth in the morning and at bedtime.   Multiple Vitamins-Minerals (MULTIVITAMIN) LIQD Take by mouth.   OVER THE COUNTER MEDICATION chrom pico/brindal berry (GARCINIA CAMBOGIA ORAL)   OVER THE COUNTER MEDICATION Hemp extract for sleep PRN   OVER THE COUNTER MEDICATION inonotus obliquus mushroom extract- 2 daily for immune system   OVER THE COUNTER MEDICATION Life Drops- Vit B Complex    triamterene-hydrochlorothiazide (MAXZIDE-25) 37.5-25 MG tablet Take 1 tablet by mouth daily.   [DISCONTINUED] losartan (COZAAR) 25 MG tablet Take 1 tablet (25 mg total) by mouth 2 (two) times daily.   [DISCONTINUED] cloNIDine (CATAPRES) 0.1 MG tablet Please take 1 tablet orally daily as needed for blood pressure greater than 180/110. (Patient not taking: Reported on 09/28/2021)   No facility-administered encounter medications on file as of 09/28/2021.    Patient Active Problem List   Diagnosis Date Noted   Lesion of liver 05/18/2021   Family history of liver disease 05/18/2021   Diverticulitis 03/18/2021   Chest pain 11/20/2018   PMDD (premenstrual dysphoric disorder) 10/30/2017   Papillary thyroid carcinoma ([place]) 01/30/2017   Obesity 10/11/2016   Menstrual cramps 03/17/2015   Stress and adjustment reaction 12/04/2014   Vaginal spotting 02/25/2014   Atypical chest pain 10/30/2013   Headache(784.0) 10/30/2013   Postsurgical hypothyroidism 09/30/2013   Migraine headache 09/30/2013   Prediabetes 09/30/2013   Vitamin D deficiency 09/30/2013   Hypertension     Past Medical History:  Diagnosis Date   Allergy    COVID-19 virus infection 09/2020   Diverticulitis    Hypertension    Migraines    Neurology- Dr. [name] Alar   Obesity (BMI 30-39.9)    Ovarian cyst    Thyroid cancer ([place])    Thyroid    Relevant past medical, surgical, family and social history reviewed and updated as indicated. Interim medical history since our last visit reviewed.  Review of Systems  Per HPI unless specifically indicated above     Objective:    BP 134/90   Pulse 82   Temp 98.2 F (36.8 C) (Temporal)   Resp 16   Ht 5\' 4"  (1.626 m)   Wt 198 lb (89.8 kg)   SpO2 97%   BMI 33.99 kg/m   Wt Readings from Last 3 Encounters:  09/28/21 198 lb (89.8 kg)  08/22/21 196 lb 3.2 oz (89 kg)  07/26/21 198 lb 6.6 oz (90 kg)    Physical Exam Vitals and nursing note reviewed.  Constitutional:      General:  She is not in acute distress.    Appearance: Normal appearance. She is obese. She is not toxic-appearing.  Eyes:     General: No scleral icterus. Cardiovascular:     Rate and Rhythm: Normal rate and regular rhythm.     Heart sounds: Normal heart sounds. No murmur heard. Pulmonary:     Effort: Pulmonary effort is normal. No respiratory distress.     Breath sounds: Normal breath sounds. No wheezing, rhonchi or rales.  Skin:    General: Skin is warm and dry.     Capillary Refill: Capillary refill takes less than 2 seconds.     Coloration: Skin is not jaundiced or pale.     Findings: No erythema.  Neurological:     Mental Status: She is alert and oriented to person, place, and time.     Motor: No weakness.     Gait: Gait normal.  Psychiatric:        Mood and Affect: Mood normal.        Behavior: Behavior normal.        Thought Content: Thought content normal.        Judgment: Judgment normal.      Assessment & Plan:   Problem List Items Addressed This Visit       Cardiovascular and Mediastinum   Hypertension - Primary    Chronic.  Blood pressure remains elevated above goal in our office.  Referral to nephrology is pending.  We will plan to continue triamterene-hydrochlorothiazide 27.5-25 mg daily.  Will increase losartan to 50 mg twice daily.  Recheck kidney function today.  Also check urine microalbumin today.  Plan to follow-up in 1 month unless she has seen nephrology and her blood pressure is improved.      Relevant Medications   losartan (COZAAR) 50 MG tablet   Other Relevant Orders   BASIC METABOLIC PANEL WITH GFR (Completed)   Microalbumin, urine (Completed)     Follow up plan: Return for 1 month BP f/u.

## 2021-09-29 LAB — BASIC METABOLIC PANEL WITH GFR
BUN/Creatinine Ratio: 13 (calc) (ref 6–22)
BUN: 15 mg/dL (ref 7–25)
CO2: 25 mmol/L (ref 20–32)
Calcium: 9.5 mg/dL (ref 8.6–10.2)
Chloride: 104 mmol/L (ref 98–110)
Creat: 1.17 mg/dL — ABNORMAL HIGH (ref 0.50–0.99)
Glucose, Bld: 79 mg/dL (ref 65–99)
Potassium: 4.6 mmol/L (ref 3.5–5.3)
Sodium: 138 mmol/L (ref 135–146)
eGFR: 60 mL/min/{1.73_m2} (ref >=60)

## 2021-09-29 LAB — MICROALBUMIN, URINE: Microalb, Ur: 2 mg/dL

## 2021-10-04 NOTE — Assessment & Plan Note (Signed)
Chronic.  Blood pressure remains elevated above goal in our office.  Referral to nephrology is pending.  We will plan to continue triamterene-hydrochlorothiazide 27.5-25 mg daily.  Will increase losartan to 50 mg twice daily.  Recheck kidney function today.  Also check urine microalbumin today.  Plan to follow-up in 1 month unless she has seen nephrology and her blood pressure is improved.

## 2021-11-02 ENCOUNTER — Ambulatory Visit: Payer: 59 | Admitting: Nurse Practitioner

## 2021-11-02 NOTE — Progress Notes (Deleted)
Subjective:    Patient ID: Alexandria [name], female    DOB: Feb 07, 1981, 40 y.o.   MRN: [id number]  HPI: Alexandria [name] is a 40 y.o. female presenting for  No chief complaint on file.  HYPERTENSION Currently taking triamterene-HCTZ 27.5-25 and losartan 50 mg twice daily.   Hypertension status:  {Blank single:19197::"controlled","uncontrolled","better","worse","exacerbated","stable"}  BP monitoring frequency:  {Blank single:19197::"not checking","rarely","daily","weekly","monthly","a few times a day","a few times a week","a few times a month"} BP range:  Medication compliance: Aspirin: {Blank single:19197::"yes","no"} Recurrent headaches: {Blank single:19197::"yes","no"} Visual changes: {Blank single:19197::"yes","no"} Palpitations: {Blank single:19197::"yes","no"} Dyspnea: {Blank single:19197::"yes","no"} Chest pain: {Blank single:19197::"yes","no"} Lower extremity edema: {Blank single:19197::"yes","no"} Dizzy/lightheaded: {Blank single:19197::"yes","no"}   Allergies  Allergen Reactions   Penicillins Other (See Comments)    Has not had since child, thinks she had reaction to it then    Outpatient Encounter Medications as of 11/02/2021  Medication Sig   Cholecalciferol (VITAMIN D3 GUMMIES ADULT PO) Take 2 each by mouth.   eletriptan (RELPAX) 40 MG tablet TAKE 1 TABLET BY MOUTH AS NEEDED FOR MIGRAINE   levothyroxine (SYNTHROID) 88 MCG tablet Take 88 mcg by mouth daily before breakfast.   liothyronine (CYTOMEL) 5 MCG tablet Take 10 mcg by mouth in the morning and at bedtime.   losartan (COZAAR) 50 MG tablet Take 1 tablet (50 mg total) by mouth in the morning and at bedtime.   Multiple Vitamins-Minerals (MULTIVITAMIN) LIQD Take by mouth.   OVER THE COUNTER MEDICATION chrom pico/brindal berry (GARCINIA CAMBOGIA ORAL)   OVER THE COUNTER MEDICATION Hemp extract for sleep PRN   OVER THE COUNTER MEDICATION inonotus obliquus mushroom extract- 2 daily for immune system   OVER THE  COUNTER MEDICATION Life Drops- Vit B Complex   triamterene-hydrochlorothiazide (MAXZIDE-25) 37.5-25 MG tablet Take 1 tablet by mouth daily.   No facility-administered encounter medications on file as of 11/02/2021.    Patient Active Problem List   Diagnosis Date Noted   Lesion of liver 05/18/2021   Family history of liver disease 05/18/2021   Diverticulitis 03/18/2021   Chest pain 11/20/2018   PMDD (premenstrual dysphoric disorder) 10/30/2017   Papillary thyroid carcinoma ([place]) 01/30/2017   Obesity 10/11/2016   Menstrual cramps 03/17/2015   Stress and adjustment reaction 12/04/2014   Vaginal spotting 02/25/2014   Atypical chest pain 10/30/2013   Headache(784.0) 10/30/2013   Postsurgical hypothyroidism 09/30/2013   Migraine headache 09/30/2013   Prediabetes 09/30/2013   Vitamin D deficiency 09/30/2013   Hypertension     Past Medical History:  Diagnosis Date   Allergy    COVID-19 virus infection 09/2020   Diverticulitis    Hypertension    Migraines    Neurology- Dr. [name] Alar   Obesity (BMI 30-39.9)    Ovarian cyst    Thyroid cancer ([place])    Thyroid    Relevant past medical, surgical, family and social history reviewed and updated as indicated. Interim medical history since our last visit reviewed.  Review of Systems Per HPI unless specifically indicated above     Objective:    There were no vitals taken for this visit.  Wt Readings from Last 3 Encounters:  09/28/21 198 lb (89.8 kg)  08/22/21 196 lb 3.2 oz (89 kg)  07/26/21 198 lb 6.6 oz (90 kg)    Physical Exam  Results for orders placed or performed in visit on 10/01/11  BASIC METABOLIC PANEL WITH GFR  Result Value Ref Range   Glucose, Bld 79 65 - 99 mg/dL   BUN 15 7 -  25 mg/dL   Creat 1.17 (H) 0.50 - 0.99 mg/dL   eGFR 60 > OR = 60 mL/min/1.48m   BUN/Creatinine Ratio 13 6 - 22 (calc)   Sodium 138 135 - 146 mmol/L   Potassium 4.6 3.5 - 5.3 mmol/L   Chloride 104 98 - 110 mmol/L   CO2 25 20 - 32 mmol/L    Calcium 9.5 8.6 - 10.2 mg/dL  Microalbumin, urine  Result Value Ref Range   Microalb, Ur 2.0 mg/dL   RAM        Assessment & Plan:   Problem List Items Addressed This Visit   None    Follow up plan: No follow-ups on file.

## 2021-11-03 ENCOUNTER — Ambulatory Visit: Payer: 59 | Admitting: Nurse Practitioner

## 2022-03-05 ENCOUNTER — Other Ambulatory Visit: Payer: Self-pay

## 2022-03-05 ENCOUNTER — Encounter: Payer: Self-pay | Admitting: Emergency Medicine

## 2022-03-05 ENCOUNTER — Emergency Department
Admission: EM | Admit: 2022-03-05 | Discharge: 2022-03-06 | Disposition: A | Discharge status: Home / Self Care | Payer: 59 | Attending: Emergency Medicine | Admitting: Emergency Medicine

## 2022-03-05 ENCOUNTER — Emergency Department: Payer: 59

## 2022-03-05 DIAGNOSIS — R202 Paresthesia of skin: Secondary | ICD-10-CM | POA: Insufficient documentation

## 2022-03-05 DIAGNOSIS — N9489 Other specified conditions associated with female genital organs and menstrual cycle: Secondary | ICD-10-CM | POA: Insufficient documentation

## 2022-03-05 DIAGNOSIS — R1013 Epigastric pain: Secondary | ICD-10-CM | POA: Insufficient documentation

## 2022-03-05 DIAGNOSIS — R110 Nausea: Secondary | ICD-10-CM | POA: Diagnosis not present

## 2022-03-05 DIAGNOSIS — K2900 Acute gastritis without bleeding: Secondary | ICD-10-CM

## 2022-03-05 DIAGNOSIS — I10 Essential (primary) hypertension: Secondary | ICD-10-CM | POA: Insufficient documentation

## 2022-03-05 DIAGNOSIS — R0789 Other chest pain: Secondary | ICD-10-CM | POA: Diagnosis not present

## 2022-03-05 LAB — URINALYSIS, ROUTINE W REFLEX MICROSCOPIC
Bilirubin Urine: NEGATIVE
Glucose, UA: NEGATIVE mg/dL
Hgb urine dipstick: NEGATIVE
Ketones, ur: NEGATIVE mg/dL
Leukocytes,Ua: NEGATIVE
Nitrite: NEGATIVE
Protein, ur: NEGATIVE mg/dL
Specific Gravity, Urine: 1.002 — ABNORMAL LOW (ref 1.005–1.030)
pH: 9 — ABNORMAL HIGH (ref 5.0–8.0)

## 2022-03-05 LAB — CBC
HCT: 40.2 % (ref 36.0–46.0)
Hemoglobin: 13.4 g/dL (ref 12.0–15.0)
MCH: 27.6 pg (ref 26.0–34.0)
MCHC: 33.3 g/dL (ref 30.0–36.0)
MCV: 82.7 fL (ref 80.0–100.0)
Platelets: 325 10*3/uL (ref 150–400)
RBC: 4.86 MIL/uL (ref 3.87–5.11)
RDW: 12.2 % (ref 11.5–15.5)
WBC: 6.8 10*3/uL (ref 4.0–10.5)
nRBC: 0 % (ref 0.0–0.2)

## 2022-03-05 LAB — COMPREHENSIVE METABOLIC PANEL
ALT: 111 U/L — ABNORMAL HIGH (ref 0–44)
AST: 158 U/L — ABNORMAL HIGH (ref 15–41)
Albumin: 3.7 g/dL (ref 3.5–5.0)
Alkaline Phosphatase: 104 U/L (ref 38–126)
Anion gap: 9 (ref 5–15)
BUN: 12 mg/dL (ref 6–20)
CO2: 27 mmol/L (ref 22–32)
Calcium: 9.3 mg/dL (ref 8.9–10.3)
Chloride: 103 mmol/L (ref 98–111)
Creatinine, Ser: 0.99 mg/dL (ref 0.44–1.00)
GFR, Estimated: >60 mL/min (ref >60)
Glucose, Bld: 115 mg/dL — ABNORMAL HIGH (ref 70–99)
Potassium: 3 mmol/L — ABNORMAL LOW (ref 3.5–5.1)
Sodium: 139 mmol/L (ref 135–145)
Total Bilirubin: 0.7 mg/dL (ref 0.3–1.2)
Total Protein: 7.1 g/dL (ref 6.5–8.1)

## 2022-03-05 LAB — POC URINE PREG, ED: Preg Test, Ur: NEGATIVE

## 2022-03-05 LAB — LIPASE, BLOOD: Lipase: 43 U/L (ref 11–51)

## 2022-03-05 LAB — TROPONIN I (HIGH SENSITIVITY): Troponin I (High Sensitivity): 7 ng/L (ref <18)

## 2022-03-05 LAB — HCG, QUANTITATIVE, PREGNANCY: hCG, Beta Chain, Quant, S: <1 m[IU]/mL (ref <5)

## 2022-03-05 MED ORDER — ONDANSETRON HCL 4 MG/2ML IJ SOLN
4.0000 mg | Freq: Once | INTRAMUSCULAR | Status: AC
  Administered 2022-03-05: 4 mg via INTRAVENOUS
  Filled 2022-03-05: qty 2

## 2022-03-05 MED ORDER — FAMOTIDINE 20 MG PO TABS: 20.0000 mg | ORAL_TABLET | Freq: Two times a day (BID) | ORAL | 0 refills | Status: DC

## 2022-03-05 MED ORDER — ALUMINUM-MAGNESIUM-SIMETHICONE 200-200-20 MG/5ML PO SUSP
30.0000 mL | Freq: Three times a day (TID) | ORAL | 0 refills | Status: DC
Start: 2022-03-05 — End: 2024-10-02

## 2022-03-05 MED ORDER — IOHEXOL 350 MG/ML SOLN
100.0000 mL | Freq: Once | INTRAVENOUS | Status: AC | PRN
  Administered 2022-03-05: 100 mL via INTRAVENOUS

## 2022-03-05 MED ORDER — ALUM & MAG HYDROXIDE-SIMETH 200-200-20 MG/5ML PO SUSP
30.0000 mL | Freq: Once | ORAL | Status: AC
  Administered 2022-03-05: 30 mL via ORAL
  Filled 2022-03-05: qty 30

## 2022-03-05 MED ORDER — PANTOPRAZOLE SODIUM 40 MG IV SOLR
40.0000 mg | Freq: Once | INTRAVENOUS | Status: AC
  Administered 2022-03-05: 40 mg via INTRAVENOUS
  Filled 2022-03-05: qty 10

## 2022-03-05 MED ORDER — HYDROMORPHONE HCL 1 MG/ML IJ SOLN
1.0000 mg | Freq: Once | INTRAMUSCULAR | Status: AC
  Administered 2022-03-05: 1 mg via INTRAVENOUS
  Filled 2022-03-05: qty 1

## 2022-03-05 MED ORDER — METOCLOPRAMIDE HCL 5 MG/ML IJ SOLN
10.0000 mg | INTRAMUSCULAR | Status: AC
  Administered 2022-03-05: 10 mg via INTRAVENOUS
  Filled 2022-03-05: qty 2

## 2022-03-05 MED ORDER — METOCLOPRAMIDE HCL 10 MG PO TABS: 10.0000 mg | ORAL_TABLET | Freq: Four times a day (QID) | ORAL | 0 refills | Status: DC | PRN

## 2022-03-05 MED ORDER — DIPHENHYDRAMINE HCL 50 MG/ML IJ SOLN
25.0000 mg | Freq: Once | INTRAMUSCULAR | Status: AC
  Administered 2022-03-05: 25 mg via INTRAVENOUS
  Filled 2022-03-05: qty 1

## 2022-03-05 MED ORDER — LACTATED RINGERS IV BOLUS
1000.0000 mL | Freq: Once | INTRAVENOUS | Status: AC
  Administered 2022-03-05: 1000 mL via INTRAVENOUS

## 2022-03-05 NOTE — ED Triage Notes (Signed)
Pt via POV from home. Pt c/o abd pain and nausea that started today. Pt has a hx of pancreatitis. Pt was just was discharged for the same. Pt is A&OX4 but moaning in pain during triage.  ?

## 2022-03-05 NOTE — ED Notes (Signed)
Pt provided water and prompted to drink. Pt had to be woken up ?

## 2022-03-05 NOTE — ED Notes (Signed)
Called CT for scan with no answer  ? ?

## 2022-03-05 NOTE — ED Provider Notes (Signed)
? ?[facility] ?Provider Note ? ? ? Event Date/Time  ? First MD Initiated Contact with Patient 03/05/22 1917   ?  (approximate) ? ? ?History  ? ?Abdominal Pain ? ? ?HPI ? ?Alexandria [name] is a 41 y.o. female with a history of hypertension, diverticulitis, thyroidectomy who comes ED complaining of nausea, epigastric pain radiating to the back and up to the upper back, associated with some left arm paresthesia.  No headache or vision change, no motor weakness, no falls or trauma. ? ?Denies chest pain or shortness of breath ? ?  ? ? ?Physical Exam  ? ?Triage Vital Signs: ?ED Triage Vitals  ?Enc Vitals Group  ?   BP 03/05/22 1856 (!) 155/103  ?   Pulse Rate 03/05/22 1856 65  ?   Resp 03/05/22 1856 16  ?   Temp 03/05/22 1856 97.9 ?F (36.6 ?C)  ?   Temp Source 03/05/22 1856 Oral  ?   SpO2 03/05/22 1856 99 %  ?   Weight 03/05/22 1849 200 lb (90.7 kg)  ?   Height 03/05/22 1849 '5\' 4"'$  (1.626 m)  ?   Head Circumference --   ?   Peak Flow --   ?   Pain Score 03/05/22 1849 10  ?   Pain Loc --   ?   Pain Edu? --   ?   Excl. in [place]? --   ? ? ?Most recent vital signs: ?Vitals:  ? 03/05/22 2130 03/05/22 2200  ?BP: (!) 169/103 (!) 177/92  ?Pulse: 77 78  ?Resp:  17  ?Temp:  98.2 ?F (36.8 ?C)  ?SpO2: 100% 97%  ? ? ? ?General: Awake, no distress.  Exhibiting severe pain ?CV:  Good peripheral perfusion.  Regular rate and rhythm.  Symmetric peripheral pulses. ?Resp:  Normal effort.  Clear to auscultation bilaterally ?Abd:  No distention.  Pronounced epigastric tenderness ?Other:  No lower extremity edema or rash.  Moist oral mucosa ? ? ?ED Results / Procedures / Treatments  ? ?Labs ?(all labs ordered are listed, but only abnormal results are displayed) ?Labs Reviewed  ?COMPREHENSIVE METABOLIC PANEL - Abnormal; Notable for the following components:  ?    Result Value  ? Potassium 3.0 (*)   ? Glucose, Bld 115 (*)   ? AST 158 (*)   ? ALT 111 (*)   ? All other components within normal limits  ?URINALYSIS, ROUTINE W REFLEX  MICROSCOPIC - Abnormal; Notable for the following components:  ? Color, Urine COLORLESS (*)   ? APPearance CLEAR (*)   ? Specific Gravity, Urine 1.002 (*)   ? pH 9.0 (*)   ? All other components within normal limits  ?LIPASE, BLOOD  ?CBC  ?HCG, QUANTITATIVE, PREGNANCY  ?POC URINE PREG, ED  ?POC URINE PREG, ED  ?TROPONIN I (HIGH SENSITIVITY)  ? ? ? ?EKG ? ? ? ? ?RADIOLOGY ?CT abdomen pelvis reviewed and interpreted by me, no aneurysm or dissection.  No abdominal free air.  Radiology report reviewed ? ? ?PROCEDURES: ? ?Critical Care performed: No ? ?Procedures ? ? ?MEDICATIONS ORDERED IN ED: ?Medications  ?lactated ringers bolus 1,000 mL (0 mLs Intravenous Stopped 03/05/22 2054)  ?ondansetron [facility]) injection 4 mg (4 mg Intravenous Given 03/05/22 1942)  ?HYDROmorphone (DILAUDID) injection 1 mg (1 mg Intravenous Given 03/05/22 1943)  ?pantoprazole (PROTONIX) injection 40 mg (40 mg Intravenous Given 03/05/22 1943)  ?iohexol (OMNIPAQUE) 350 MG/ML injection 100 mL (100 mLs Intravenous Contrast Given 03/05/22 2115)  ?alum &  mag hydroxide-simeth (MAALOX/MYLANTA) 200-200-20 MG/5ML suspension 30 mL (30 mLs Oral Given 03/05/22 2227)  ?metoCLOPramide (REGLAN) injection 10 mg (10 mg Intravenous Given 03/05/22 2228)  ?diphenhydrAMINE (BENADRYL) injection 25 mg (25 mg Intravenous Given 03/05/22 2228)  ? ? ? ?IMPRESSION / MDM / ASSESSMENT AND PLAN / ED COURSE  ?I reviewed the triage vital signs and the nursing notes. ?             ?               ? ?Differential diagnosis includes, but is not limited to, pancreatitis, gastritis, diverticulitis, viral illness, AAA, aortic dissection ? ?**The patient is on the cardiac monitor to evaluate for evidence of arrhythmia and/or significant heart rate changes.**} ? ?Patient presents with severe epigastric pain radiating to the lower and upper back.  Will give IV Dilaudid for pain relief, IV fluids for hydration, obtain CT angiogram and lab  panel. ? ? ?----------------------------------------- ?11:05 PM on 03/05/2022 ?----------------------------------------- ?Labs and CT scan are all unremarkable.  After receiving GI cocktail, patient is much more comfortable and resting.  Not requiring admission due to reassuring workup and improved symptoms.  Suspect gerd/gastritis.  Will p.o. trial, plan for outpatient follow-up. ?  ? ? ?FINAL CLINICAL IMPRESSION(S) / ED DIAGNOSES  ? ?Final diagnoses:  ?Epigastric pain  ? ? ? ?Rx / DC Orders  ? ?ED Discharge Orders   ? ?      Ordered  ?  aluminum-magnesium hydroxide-simethicone (MAALOX) 200-200-20 MG/5ML SUSP  3 times daily before meals & bedtime       ? 03/05/22 2304  ?  famotidine (PEPCID) 20 MG tablet  2 times daily       ? 03/05/22 2304  ?  metoCLOPramide (REGLAN) 10 MG tablet  Every 6 hours PRN       ? 03/05/22 2304  ? ?  ?  ? ?  ? ? ? ?Note:  This document was prepared using Dragon voice recognition software and may include unintentional dictation errors. ?  ?Carrie Mew, MD ?03/05/22 2305 ? ?

## 2022-03-05 NOTE — ED Notes (Signed)
Called CT for scan no answer ?

## 2022-03-06 NOTE — ED Notes (Signed)
Pt drinking water with no pain, N/V ?

## 2022-03-09 ENCOUNTER — Other Ambulatory Visit: Payer: Self-pay | Admitting: Physician Assistant

## 2022-03-09 ENCOUNTER — Other Ambulatory Visit (HOSPITAL_COMMUNITY): Payer: Self-pay | Admitting: Physician Assistant

## 2022-03-13 ENCOUNTER — Telehealth (HOSPITAL_COMMUNITY): Payer: Self-pay | Admitting: *Deleted

## 2022-03-13 NOTE — Telephone Encounter (Signed)
Patient given detailed instructions per Myocardial Perfusion Study Information Sheet for the test on 03/20/2022 at 10:30. Patient notified to arrive 15 minutes early and that it is imperative to arrive on time for appointment to keep from having the test rescheduled. ? If you need to cancel or reschedule your appointment, please call the office within 24 hours of your appointment. . Patient verbalized understanding.Glade Lloyd S ? ? ? ?

## 2022-03-14 ENCOUNTER — Ambulatory Visit
Admission: RE | Admit: 2022-03-14 | Discharge: 2022-03-14 | Disposition: A | Discharge status: Home / Self Care | Payer: 59 | Source: Ambulatory Visit | Attending: Physician Assistant | Admitting: Physician Assistant

## 2022-03-14 DIAGNOSIS — R1011 Right upper quadrant pain: Secondary | ICD-10-CM

## 2022-03-14 DIAGNOSIS — R1013 Epigastric pain: Secondary | ICD-10-CM

## 2022-03-14 DIAGNOSIS — R7989 Other specified abnormal findings of blood chemistry: Secondary | ICD-10-CM

## 2022-03-20 ENCOUNTER — Other Ambulatory Visit: Payer: Self-pay

## 2022-03-20 ENCOUNTER — Ambulatory Visit (HOSPITAL_COMMUNITY): Payer: 59 | Attending: Cardiology

## 2022-03-20 DIAGNOSIS — R002 Palpitations: Secondary | ICD-10-CM | POA: Diagnosis not present

## 2022-03-20 DIAGNOSIS — R9431 Abnormal electrocardiogram [ECG] [EKG]: Secondary | ICD-10-CM | POA: Diagnosis not present

## 2022-03-20 DIAGNOSIS — R0602 Shortness of breath: Secondary | ICD-10-CM | POA: Diagnosis not present

## 2022-03-20 DIAGNOSIS — R1011 Right upper quadrant pain: Secondary | ICD-10-CM | POA: Diagnosis not present

## 2022-03-20 LAB — MYOCARDIAL PERFUSION IMAGING
Angina Index: 0
Duke Treadmill Score: 6
Estimated workload: 7
Exercise duration (min): 5 min
Exercise duration (sec): 30 s
LV dias vol: 109 mL (ref 46–106)
LV sys vol: 57 mL
MPHR: 177 {beats}/min
Nuc Stress EF: 48 %
Peak HR: 157 {beats}/min
Percent HR: 88 %
Rest HR: 76 {beats}/min
Rest Nuclear Isotope Dose: 11 mCi
SDS: 1
SRS: 1
SSS: 2
ST Depression (mm): 0 mm
Stress Nuclear Isotope Dose: 29.8 mCi
TID: 0.99

## 2022-03-20 MED ORDER — TECHNETIUM TC 99M TETROFOSMIN IV KIT
11.0000 | PACK | Freq: Once | INTRAVENOUS | Status: AC | PRN
  Administered 2022-03-20: 11 via INTRAVENOUS
  Filled 2022-03-20: qty 11

## 2022-03-20 MED ORDER — TECHNETIUM TC 99M TETROFOSMIN IV KIT
29.8000 | PACK | Freq: Once | INTRAVENOUS | Status: AC | PRN
  Administered 2022-03-20: 29.8 via INTRAVENOUS
  Filled 2022-03-20: qty 30

## 2022-04-12 ENCOUNTER — Other Ambulatory Visit (HOSPITAL_COMMUNITY): Payer: Self-pay | Admitting: Internal Medicine

## 2022-04-12 ENCOUNTER — Other Ambulatory Visit: Payer: Self-pay | Admitting: Internal Medicine

## 2022-04-12 DIAGNOSIS — Z85850 Personal history of malignant neoplasm of thyroid: Secondary | ICD-10-CM

## 2022-04-21 ENCOUNTER — Ambulatory Visit (HOSPITAL_COMMUNITY)
Admission: RE | Admit: 2022-04-21 | Discharge: 2022-04-21 | Disposition: A | Discharge status: Home / Self Care | Payer: 59 | Source: Ambulatory Visit | Attending: Internal Medicine | Admitting: Internal Medicine

## 2022-04-21 DIAGNOSIS — Z85850 Personal history of malignant neoplasm of thyroid: Secondary | ICD-10-CM

## 2022-04-21 LAB — GLUCOSE, CAPILLARY: Glucose-Capillary: 93 mg/dL (ref 70–99)

## 2022-04-21 MED ORDER — FLUDEOXYGLUCOSE F - 18 (FDG) INJECTION
10.1800 | Freq: Once | INTRAVENOUS | Status: AC
  Administered 2022-04-21: 10.18 via INTRAVENOUS

## 2022-07-24 ENCOUNTER — Encounter (HOSPITAL_COMMUNITY): Payer: Self-pay | Admitting: Internal Medicine

## 2022-07-24 ENCOUNTER — Ambulatory Visit (INDEPENDENT_AMBULATORY_CARE_PROVIDER_SITE_OTHER): Payer: 59 | Admitting: Cardiovascular Disease

## 2022-07-24 ENCOUNTER — Encounter: Payer: Self-pay | Admitting: Cardiovascular Disease

## 2022-07-24 VITALS — BP 130/94 | HR 74 | Ht 63.75 in | Wt 214.2 lb

## 2022-07-24 DIAGNOSIS — I340 Nonrheumatic mitral (valve) insufficiency: Secondary | ICD-10-CM | POA: Diagnosis not present

## 2022-07-24 DIAGNOSIS — Z01810 Encounter for preprocedural cardiovascular examination: Secondary | ICD-10-CM | POA: Diagnosis not present

## 2022-07-24 DIAGNOSIS — I341 Nonrheumatic mitral (valve) prolapse: Secondary | ICD-10-CM

## 2022-07-24 LAB — CBC
Hematocrit: 38.5 % (ref 34.0–46.6)
Hemoglobin: 12.7 g/dL (ref 11.1–15.9)
MCH: 28.2 pg (ref 26.6–33.0)
MCHC: 33 g/dL (ref 31.5–35.7)
MCV: 85 fL (ref 79–97)
Platelets: 308 10*3/uL (ref 150–450)
RBC: 4.51 x10E6/uL (ref 3.77–5.28)
RDW: 12.7 % (ref 11.7–15.4)
WBC: 4.5 10*3/uL (ref 3.4–10.8)

## 2022-07-24 LAB — BASIC METABOLIC PANEL
BUN/Creatinine Ratio: 12 (ref 9–23)
BUN: 12 mg/dL (ref 6–24)
CO2: 25 mmol/L (ref 20–29)
Calcium: 9.6 mg/dL (ref 8.7–10.2)
Chloride: 101 mmol/L (ref 96–106)
Creatinine, Ser: 1.04 mg/dL — ABNORMAL HIGH (ref 0.57–1.00)
Glucose: 100 mg/dL — ABNORMAL HIGH (ref 70–99)
Potassium: 4.1 mmol/L (ref 3.5–5.2)
Sodium: 138 mmol/L (ref 134–144)
eGFR: 68 mL/min/{1.73_m2} (ref >59)

## 2022-07-24 NOTE — Patient Instructions (Signed)
Medication Instructions:  Your physician recommends that you continue on your current medications as directed. Please refer to the Current Medication list given to you today.  *If you need a refill on your cardiac medications before your next appointment, please call your pharmacy*   Lab Work: CBC, BMET today If you have labs (blood work) drawn today and your tests are completely normal, you will receive your results only by: [place] (if you have MyChart) OR A paper copy in the mail If you have any lab test that is abnormal or we need to change your treatment, we will call you to review the results.  Testing/Procedures: Transesophageal Echocardiogram Your physician has requested that you have a TEE. During a TEE, sound waves are used to create images of your heart. It provides your doctor with information about the size and shape of your heart and how well your heart's chambers and valves are working. In this test, a transducer is attached to the end of a flexible tube that's guided down your throat and into your esophagus (the tube leading from you mouth to your stomach) to get a more detailed image of your heart. You are not awake for the procedure. Please see the instruction sheet given to you today. For further information please visit [email or web address].  Follow-Up: At [facility] ([place] you and your health needs are our priority.  As part of our continuing mission to provide you with exceptional heart care, we have created designated Provider Care Teams.  These Care Teams include your primary Cardiologist (physician) and Advanced Practice Providers (APPs -  Physician Assistants and Nurse Practitioners) who all work together to provide you with the care you need, when you need it.  Your next appointment:   Structural Team will follow-up  The format for your next appointment:   In Person  Provider:   Sherren Mocha, MD {  Other Instructions  You are scheduled for a TEE on  Monday, July 31, 2022 with Dr. Gasper Sells. Please arrive at the [facility] (Main Entrance A) at [facility]: [address] [place], [place] [address] at 10:30 am. (1 hour prior to procedure)  DIET: Nothing to eat or drink after midnight except a sip of water with medications (see medication instructions below)  FYI: For your safety, and to allow [place] to monitor your vital signs accurately during the surgery/procedure we request that   if you have artificial nails, gel coating, SNS etc. Please have those removed prior to your surgery/procedure. Not having the nail coverings /polish removed may result in cancellation or delay of your surgery/procedure.   Medication Instructions: You may take all of your regular medications   Labs: Today  You must have a responsible person to drive you home and stay in the waiting area during your procedure. Failure to do so could result in cancellation.  Bring your insurance cards.  *Special Note: Every effort is made to have your procedure done on time. Occasionally there are emergencies that occur at the hospital that may cause delays. Please be patient if a delay does occur.    Important Information About Sugar

## 2022-07-24 NOTE — H&P (View-Only) (Signed)
Cardiology Office Note:    Date:  07/25/2022   ID:  Alexandria [name], DOB [birth year], MRN [id number]  PCP:  Pa, Gary Providers Cardiologist:  None     Referring MD: [facility]*   Chief Complaint  Patient presents with   Mitral Valve Prolapse    History of Present Illness:    Alexandria [name] is a 41 y.o. female referred for evaluation of mitral valve prolapse and mitral regurgitation. She is here with her husband today. She has no history of rheumatic heart disease. The patient has a longstanding history of HTN, and only recently has gotten her BP under better control with antihypertensive medication adjustments. She notes a significant family history of hypertension in multiple family members. There is no valvular disease in her family.   She reports fatigue and shortness of breath at baseline, worse over the past 6 months. She has had episodes of chest heaviness, but this has improved since she has had some stressors lifted. She denies exertional chest pain or pressure. She is short of breath with any moderate level activity, but is able to walk at a slow pace for a long distance. She has gained 30 pounds over the past 3-4 months and has noticed worsening breathing and endurance associated with her weight gain. No orthopnea, PND, or edema. No other complaints.   The patient was told she has MVP back in 2019. She then underwent an echo recently, showing moderate mitral regurgitation. She is now referred for further evaluation of mitral valve disease.   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 [name] Alexandria [name]   Mitral valve  prolapse    Obesity (BMI 30-39.9)    Ovarian cyst    Papillary carcinoma of thyroid (H[facility])    Postsurgical hypothyroidism    Thyroid cancer ([place])    Thyroid   Trouble staying asleep     Past Surgical History:  Procedure Laterality Date   GALLBLADDER SURGERY     left leg surgery     parathyroid gland     thyroid removed      Current Medications: Current Meds  Medication Sig   aluminum-magnesium hydroxide-simethicone (MAALOX) 200-200-20 MG/5ML SUSP Take 30 mLs by mouth 4 (four) times daily -  before meals and at bedtime. (Patient not taking: Reported on 07/24/2022)   amLODipine (NORVASC) 5 MG tablet Take 5 mg by mouth daily.   Calcium Carbonate Antacid (ANTACID CALCIUM EXTRA STRENGTH PO) Take 2 tablets by mouth at bedtime as needed (heartburn).   carvedilol (COREG) 12.5 MG tablet Take 12.5 mg by mouth 2 (two) times daily.   Cholecalciferol 25 MCG (1000 UT) CHEW Chew 2,000 Units by mouth daily.   eletriptan (RELPAX) 40 MG tablet TAKE 1 TABLET BY MOUTH AS NEEDED FOR MIGRAINE   liothyronine (CYTOMEL) 25 MCG tablet Take 12.5 mcg by mouth in the morning and at bedtime.   Multiple Vitamins-Minerals (MULTIVITAMIN) LIQD Take 1 tablet by mouth daily.   olmesartan-hydrochlorothiazide (BENICAR HCT) 40-25 MG tablet Take 1 tablet by mouth daily.   SYNTHROID 75 MCG tablet Take 75 mcg by mouth  daily.   [DISCONTINUED] Calcium 200 MG TABS Take by mouth.   [DISCONTINUED] levothyroxine (SYNTHROID) 75 MCG tablet Take 75 mcg by mouth daily before breakfast.   [DISCONTINUED] OVER THE COUNTER MEDICATION chrom pico/brindal berry (GARCINIA CAMBOGIA ORAL)   [DISCONTINUED] OVER THE COUNTER MEDICATION Life Drops- Vit B Complex     Allergies:   Penicillins   Social History   Socioeconomic History   Marital status: Married    Spouse name: Not on file   Number of children: Not on file   Years of education: Not on file   Highest education level: Not on file  Occupational History   Not on file  Tobacco  Use   Smoking status: Never   Smokeless tobacco: Never  Vaping Use   Vaping Use: Never used  Substance and Sexual Activity   Alcohol use: No   Drug use: No   Sexual activity: Yes    Birth control/protection: I.U.D.  Other Topics Concern   Not on file  Social History Narrative   Not on file   Social Determinants of Health   Financial Resource Strain: Not on file  Food Insecurity: Not on file  Transportation Needs: Not on file  Physical Activity: Not on file  Stress: Not on file  Social Connections: Not on file     Family History: The patient's family history includes Alzheimer's disease in her maternal grandfather; Anxiety disorder in her paternal uncle; Cancer in her maternal grandfather; Diabetes in her maternal grandfather and mother; Hypertension in her maternal grandmother, mother, paternal aunt, and paternal uncle; Liver disease in her sister; Parkinsonism in her maternal grandfather; Stroke in her paternal aunt.  ROS:   Please see the history of present illness.    All other systems reviewed and are negative.  EKGs/Labs/Other Studies Reviewed:    The following studies were reviewed today: Myocardial Perfusion Study:   Baseline EKG showed NSR with Nonspecific St abnormality   Exercise capacity was mildly impaired. Patient exercised for 5 min and 30 sec. Maximum HR of 157 bpm. MPHR 88.0 %. Peak METS 7.0 . Hypertensive blood pressure and normal heart rate response noted during stress.   No ST deviation was noted from baseline during exercise. Arrhythmias during exercise and recovery: occasional PVCs.   LV perfusion is normal. There is no evidence of ischemia. There is no evidence of infarction.   Left ventricular function is normal. Nuclear stress EF: caculated at 48 % but appears at least 55%. The left ventricular ejection fraction is mildly decreased (45-54%). End diastolic cavity size is normal. End systolic cavity size is normal.   Prior study available for comparison  from 12/03/2018.   The study is normal. The study is low risk.   Consider repeat 2D echo to assess LVF further.  EKG:  EKG is ordered today.  The ekg ordered today demonstrates NSR with nonspecific ST abnormality, HR 74 bpm  Recent Labs: 03/05/2022: ALT 111 07/24/2022: BUN 12; Creatinine, Ser 1.04; Hemoglobin 12.7; Platelets 308; Potassium 4.1; Sodium 138  Recent Lipid Panel    Component Value Date/Time   CHOL 141 07/21/2021 1226   TRIG 62 07/21/2021 1226   HDL 54 07/21/2021 1226   CHOLHDL 2.6 07/21/2021 1226   VLDL 11 11/21/2018 0339   LDLCALC 73 07/21/2021 1226     Risk Assessment/Calculations:           Physical Exam:    VS:  BP (!) 130/94   Pulse 74   Ht 5' 3.75" (1.619 m)  Wt 214 lb 3.2 oz (97.2 kg)   SpO2 98%   BMI 37.06 kg/m     Wt Readings from Last 3 Encounters:  07/24/22 214 lb 3.2 oz (97.2 kg)  03/20/22 200 lb (90.7 kg)  03/05/22 200 lb (90.7 kg)     GEN:  Well nourished, well developed in no acute distress HEENT: Normal NECK: No JVD; No carotid bruits LYMPHATICS: No lymphadenopathy CARDIAC: RRR, no murmurs, rubs, gallops RESPIRATORY:  Clear to auscultation without rales, wheezing or rhonchi  ABDOMEN: Soft, non-tender, non-distended MUSCULOSKELETAL:  No edema; No deformity  SKIN: Warm and dry NEUROLOGIC:  Alert and oriented x 3 PSYCHIATRIC:  Normal affect   ASSESSMENT:    1. Pre-procedural cardiovascular examination   2. Mitral valve prolapse   3. Nonrheumatic mitral valve regurgitation    PLAN:    In order of problems listed above:  The patient has mitral regurgitation with NYHA II functional limitation of shortness of breath and fatigue. I reviewed her echo images which are of somewhat limited image quality. She appears to have grossly normal LF systolic function and there is at least moderate mitral regurgitation identified. I cannot appreciate an obvious flail segment, but she does have some myxomatous changes seen with moderate central  MR. I reviewed the natural history of mitral regurgitation with the patient today. We discussed further diagnostic testing and it's role in better quantifying the degree of MR and also in understanding the functional anatomy of the mitral valve. I have recommended a TEE for further evaluation. I have reviewed the risks, indications, and alternatives to TEE with the patient, who understands and agrees to proceed. As long as her MR is moderate or less, I would plan on continued clinical and echo surveillance. We discussed the role for medical therapy, weight loss, exercise, and treatment of hypertension. She understands and I will be in touch with her once her TEE is reviewed. I would anticipate annual echo surveillance at least early on.       Shared Decision Making/Informed Consent The risks [esophageal damage, perforation (1:10,000 risk), bleeding, pharyngeal hematoma as well as other potential complications associated with conscious sedation including aspiration, arrhythmia, respiratory failure and death], benefits (treatment guidance and diagnostic support) and alternatives of a transesophageal echocardiogram were discussed in detail with Ms. Rex Kras and she is willing to proceed.     Medication Adjustments/Labs and Tests Ordered: Current medicines are reviewed at length with the patient today.  Concerns regarding medicines are outlined above.  Orders Placed This Encounter  Procedures   CBC   Basic metabolic panel   EKG [id number]   No orders of the defined types were placed in this encounter.   Patient Instructions  Medication Instructions:  Your physician recommends that you continue on your current medications as directed. Please refer to the Current Medication list given to you today.  *If you need a refill on your cardiac medications before your next appointment, please call your pharmacy*   Lab Work: CBC, BMET today If you have labs (blood work) drawn today and your tests are  completely normal, you will receive your results only by: [place] (if you have MyChart) OR A paper copy in the mail If you have any lab test that is abnormal or we need to change your treatment, we will call you to review the results.  Testing/Procedures: Transesophageal Echocardiogram Your physician has requested that you have a TEE. During a TEE, sound waves are used to create images of your heart.  It provides your doctor with information about the size and shape of your heart and how well your heart's chambers and valves are working. In this test, a transducer is attached to the end of a flexible tube that's guided down your throat and into your esophagus (the tube leading from you mouth to your stomach) to get a more detailed image of your heart. You are not awake for the procedure. Please see the instruction sheet given to you today. For further information please visit [email or web address].  Follow-Up: At [facility], you and your health needs are our priority.  As part of our continuing mission to provide you with exceptional heart care, we have created designated Provider Care Teams.  These Care Teams include your primary Cardiologist (physician) and Advanced Practice Providers (APPs -  Physician Assistants and Nurse Practitioners) who all work together to provide you with the care you need, when you need it.  Your next appointment:   Structural Team will follow-up  The format for your next appointment:   In Person  Provider:   Sherren Mocha, MD {  Other Instructions  You are scheduled for a TEE on Monday, July 31, 2022 with Dr. Gasper Sells. Please arrive at the [facility] (Main Entrance A) at [facility]: [address] [place], [place] [address] at 10:30 am. (1 hour prior to procedure)  DIET: Nothing to eat or drink after midnight except a sip of water with medications (see medication instructions below)  FYI: For your safety, and to allow [place] to monitor  your vital signs accurately during the surgery/procedure we request that   if you have artificial nails, gel coating, SNS etc. Please have those removed prior to your surgery/procedure. Not having the nail coverings /polish removed may result in cancellation or delay of your surgery/procedure.   Medication Instructions: You may take all of your regular medications   Labs: Today  You must have a responsible person to drive you home and stay in the waiting area during your procedure. Failure to do so could result in cancellation.  Bring your insurance cards.  *Special Note: Every effort is made to have your procedure done on time. Occasionally there are emergencies that occur at the hospital that may cause delays. Please be patient if a delay does occur.    Important Information About Sugar         Signed, Sherren Mocha, MD  07/25/2022 3:56 PM    [place]

## 2022-07-24 NOTE — Progress Notes (Unsigned)
Cardiology Office Note:    Date:  07/25/2022   ID:  GARIMA CHRONIS, DOB Jan 13, 1981, MRN [id number]  PCP:  Pa, [place] Providers Cardiologist:  None     Referring MD: [facility]*   Chief Complaint  Patient presents with   Mitral Valve Prolapse    History of Present Illness:    Alexandria [name] is a 41 y.o. female referred for evaluation of mitral valve prolapse and mitral regurgitation. She is here with her husband today. She has no history of rheumatic heart disease. The patient has a longstanding history of HTN, and only recently has gotten her BP under better control with antihypertensive medication adjustments. She notes a significant family history of hypertension in multiple family members. There is no valvular disease in her family.   She reports fatigue and shortness of breath at baseline, worse over the past 6 months. She has had episodes of chest heaviness, but this has improved since she has had some stressors lifted. She denies exertional chest pain or pressure. She is short of breath with any moderate level activity, but is able to walk at a slow pace for a long distance. She has gained 30 pounds over the past 3-4 months and has noticed worsening breathing and endurance associated with her weight gain. No orthopnea, PND, or edema. No other complaints.   The patient was told she has MVP back in 2019. She then underwent an echo recently, showing moderate mitral regurgitation. She is now referred for further evaluation of mitral valve disease.   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 [name] Alar   Mitral valve  prolapse    Obesity (BMI 30-39.9)    Ovarian cyst    Papillary carcinoma of thyroid (HCC)    Postsurgical hypothyroidism    Thyroid cancer ([place])    Thyroid   Trouble staying asleep     Past Surgical History:  Procedure Laterality Date   GALLBLADDER SURGERY     left leg surgery     parathyroid gland     thyroid removed      Current Medications: Current Meds  Medication Sig   aluminum-magnesium hydroxide-simethicone (MAALOX) 200-200-20 MG/5ML SUSP Take 30 mLs by mouth 4 (four) times daily -  before meals and at bedtime. (Patient not taking: Reported on 07/24/2022)   amLODipine (NORVASC) 5 MG tablet Take 5 mg by mouth daily.   Calcium Carbonate Antacid (ANTACID CALCIUM EXTRA STRENGTH PO) Take 2 tablets by mouth at bedtime as needed (heartburn).   carvedilol (COREG) 12.5 MG tablet Take 12.5 mg by mouth 2 (two) times daily.   Cholecalciferol 25 MCG (1000 UT) CHEW Chew 2,000 Units by mouth daily.   eletriptan (RELPAX) 40 MG tablet TAKE 1 TABLET BY MOUTH AS NEEDED FOR MIGRAINE   liothyronine (CYTOMEL) 25 MCG tablet Take 12.5 mcg by mouth in the morning and at bedtime.   Multiple Vitamins-Minerals (MULTIVITAMIN) LIQD Take 1 tablet by mouth daily.   olmesartan-hydrochlorothiazide (BENICAR HCT) 40-25 MG tablet Take 1 tablet by mouth daily.   SYNTHROID 75 MCG tablet Take 75 mcg by mouth  daily.   [DISCONTINUED] Calcium 200 MG TABS Take by mouth.   [DISCONTINUED] levothyroxine (SYNTHROID) 75 MCG tablet Take 75 mcg by mouth daily before breakfast.   [DISCONTINUED] OVER THE COUNTER MEDICATION chrom pico/brindal berry (GARCINIA CAMBOGIA ORAL)   [DISCONTINUED] OVER THE COUNTER MEDICATION Life Drops- Vit B Complex     Allergies:   Penicillins   Social History   Socioeconomic History   Marital status: Married    Spouse name: Not on file   Number of children: Not on file   Years of education: Not on file   Highest education level: Not on file  Occupational History   Not on file  Tobacco  Use   Smoking status: Never   Smokeless tobacco: Never  Vaping Use   Vaping Use: Never used  Substance and Sexual Activity   Alcohol use: No   Drug use: No   Sexual activity: Yes    Birth control/protection: I.U.D.  Other Topics Concern   Not on file  Social History Narrative   Not on file   Social Determinants of Health   Financial Resource Strain: Not on file  Food Insecurity: Not on file  Transportation Needs: Not on file  Physical Activity: Not on file  Stress: Not on file  Social Connections: Not on file     Family History: The patient's family history includes Alzheimer's disease in her maternal grandfather; Anxiety disorder in her paternal uncle; Cancer in her maternal grandfather; Diabetes in her maternal grandfather and mother; Hypertension in her maternal grandmother, mother, paternal aunt, and paternal uncle; Liver disease in her sister; Parkinsonism in her maternal grandfather; Stroke in her paternal aunt.  ROS:   Please see the history of present illness.    All other systems reviewed and are negative.  EKGs/Labs/Other Studies Reviewed:    The following studies were reviewed today: Myocardial Perfusion Study:   Baseline EKG showed NSR with Nonspecific St abnormality   Exercise capacity was mildly impaired. Patient exercised for 5 min and 30 sec. Maximum HR of 157 bpm. MPHR 88.0 %. Peak METS 7.0 . Hypertensive blood pressure and normal heart rate response noted during stress.   No ST deviation was noted from baseline during exercise. Arrhythmias during exercise and recovery: occasional PVCs.   LV perfusion is normal. There is no evidence of ischemia. There is no evidence of infarction.   Left ventricular function is normal. Nuclear stress EF: caculated at 48 % but appears at least 55%. The left ventricular ejection fraction is mildly decreased (45-54%). End diastolic cavity size is normal. End systolic cavity size is normal.   Prior study available for comparison  from 12/03/2018.   The study is normal. The study is low risk.   Consider repeat 2D echo to assess LVF further.  EKG:  EKG is ordered today.  The ekg ordered today demonstrates NSR with nonspecific ST abnormality, HR 74 bpm  Recent Labs: 03/05/2022: ALT 111 07/24/2022: BUN 12; Creatinine, Ser 1.04; Hemoglobin 12.7; Platelets 308; Potassium 4.1; Sodium 138  Recent Lipid Panel    Component Value Date/Time   CHOL 141 07/21/2021 1226   TRIG 62 07/21/2021 1226   HDL 54 07/21/2021 1226   CHOLHDL 2.6 07/21/2021 1226   VLDL 11 11/21/2018 0339   LDLCALC 73 07/21/2021 1226     Risk Assessment/Calculations:           Physical Exam:    VS:  BP (!) 130/94   Pulse 74   Ht 5' 3.75" (1.619 m)  Wt 214 lb 3.2 oz (97.2 kg)   SpO2 98%   BMI 37.06 kg/m     Wt Readings from Last 3 Encounters:  07/24/22 214 lb 3.2 oz (97.2 kg)  03/20/22 200 lb (90.7 kg)  03/05/22 200 lb (90.7 kg)     GEN:  Well nourished, well developed in no acute distress HEENT: Normal NECK: No JVD; No carotid bruits LYMPHATICS: No lymphadenopathy CARDIAC: RRR, no murmurs, rubs, gallops RESPIRATORY:  Clear to auscultation without rales, wheezing or rhonchi  ABDOMEN: Soft, non-tender, non-distended MUSCULOSKELETAL:  No edema; No deformity  SKIN: Warm and dry NEUROLOGIC:  Alert and oriented x 3 PSYCHIATRIC:  Normal affect   ASSESSMENT:    1. Pre-procedural cardiovascular examination   2. Mitral valve prolapse   3. Nonrheumatic mitral valve regurgitation    PLAN:    In order of problems listed above:  The patient has mitral regurgitation with NYHA II functional limitation of shortness of breath and fatigue. I reviewed her echo images which are of somewhat limited image quality. She appears to have grossly normal LF systolic function and there is at least moderate mitral regurgitation identified. I cannot appreciate an obvious flail segment, but she does have some myxomatous changes seen with moderate central  MR. I reviewed the natural history of mitral regurgitation with the patient today. We discussed further diagnostic testing and it's role in better quantifying the degree of MR and also in understanding the functional anatomy of the mitral valve. I have recommended a TEE for further evaluation. I have reviewed the risks, indications, and alternatives to TEE with the patient, who understands and agrees to proceed. As long as her MR is moderate or less, I would plan on continued clinical and echo surveillance. We discussed the role for medical therapy, weight loss, exercise, and treatment of hypertension. She understands and I will be in touch with her once her TEE is reviewed. I would anticipate annual echo surveillance at least early on.       Shared Decision Making/Informed Consent The risks [esophageal damage, perforation (1:10,000 risk), bleeding, pharyngeal hematoma as well as other potential complications associated with conscious sedation including aspiration, arrhythmia, respiratory failure and death], benefits (treatment guidance and diagnostic support) and alternatives of a transesophageal echocardiogram were discussed in detail with Ms. Rex Kras and she is willing to proceed.     Medication Adjustments/Labs and Tests Ordered: Current medicines are reviewed at length with the patient today.  Concerns regarding medicines are outlined above.  Orders Placed This Encounter  Procedures   CBC   Basic metabolic panel   EKG 97-QBHA   No orders of the defined types were placed in this encounter.   Patient Instructions  Medication Instructions:  Your physician recommends that you continue on your current medications as directed. Please refer to the Current Medication list given to you today.  *If you need a refill on your cardiac medications before your next appointment, please call your pharmacy*   Lab Work: CBC, BMET today If you have labs (blood work) drawn today and your tests are  completely normal, you will receive your results only by: [place] (if you have MyChart) OR A paper copy in the mail If you have any lab test that is abnormal or we need to change your treatment, we will call you to review the results.  Testing/Procedures: Transesophageal Echocardiogram Your physician has requested that you have a TEE. During a TEE, sound waves are used to create images of your heart.  It provides your doctor with information about the size and shape of your heart and how well your heart's chambers and valves are working. In this test, a transducer is attached to the end of a flexible tube that's guided down your throat and into your esophagus (the tube leading from you mouth to your stomach) to get a more detailed image of your heart. You are not awake for the procedure. Please see the instruction sheet given to you today. For further information please visit [email or web address].  Follow-Up: At [facility], you and your health needs are our priority.  As part of our continuing mission to provide you with exceptional heart care, we have created designated Provider Care Teams.  These Care Teams include your primary Cardiologist (physician) and Advanced Practice Providers (APPs -  Physician Assistants and Nurse Practitioners) who all work together to provide you with the care you need, when you need it.  Your next appointment:   Structural Team will follow-up  The format for your next appointment:   In Person  Provider:   Sherren Mocha, MD {  Other Instructions  You are scheduled for a TEE on Monday, July 31, 2022 with Dr. Gasper Sells. Please arrive at the [facility] (Main Entrance A) at [facility]: [address] [place], [place] [address] at 10:30 am. (1 hour prior to procedure)  DIET: Nothing to eat or drink after midnight except a sip of water with medications (see medication instructions below)  FYI: For your safety, and to allow [place] to monitor  your vital signs accurately during the surgery/procedure we request that   if you have artificial nails, gel coating, SNS etc. Please have those removed prior to your surgery/procedure. Not having the nail coverings /polish removed may result in cancellation or delay of your surgery/procedure.   Medication Instructions: You may take all of your regular medications   Labs: Today  You must have a responsible person to drive you home and stay in the waiting area during your procedure. Failure to do so could result in cancellation.  Bring your insurance cards.  *Special Note: Every effort is made to have your procedure done on time. Occasionally there are emergencies that occur at the hospital that may cause delays. Please be patient if a delay does occur.    Important Information About Sugar         Signed, Sherren Mocha, MD  07/25/2022 3:56 PM    [place]

## 2022-07-25 ENCOUNTER — Encounter: Payer: Self-pay | Admitting: Cardiovascular Disease

## 2022-07-31 ENCOUNTER — Ambulatory Visit (HOSPITAL_COMMUNITY): Payer: 59 | Admitting: Certified Registered"

## 2022-07-31 ENCOUNTER — Ambulatory Visit (HOSPITAL_BASED_OUTPATIENT_CLINIC_OR_DEPARTMENT_OTHER): Payer: 59 | Admitting: Certified Registered"

## 2022-07-31 ENCOUNTER — Ambulatory Visit (HOSPITAL_COMMUNITY)
Admission: RE | Admit: 2022-07-31 | Discharge: 2022-07-31 | Disposition: A | Discharge status: Home / Self Care | Payer: 59 | Attending: Internal Medicine | Admitting: Internal Medicine

## 2022-07-31 ENCOUNTER — Encounter (HOSPITAL_COMMUNITY)
Admission: RE | Disposition: A | Discharge status: Home / Self Care | Payer: Self-pay | Source: Home / Self Care | Attending: Internal Medicine

## 2022-07-31 ENCOUNTER — Ambulatory Visit (HOSPITAL_BASED_OUTPATIENT_CLINIC_OR_DEPARTMENT_OTHER)
Admission: RE | Admit: 2022-07-31 | Discharge: 2022-07-31 | Disposition: A | Discharge status: Home / Self Care | Payer: 59 | Source: Ambulatory Visit | Attending: Cardiovascular Disease | Admitting: Cardiovascular Disease

## 2022-07-31 ENCOUNTER — Other Ambulatory Visit: Payer: Self-pay

## 2022-07-31 ENCOUNTER — Encounter (HOSPITAL_COMMUNITY): Payer: Self-pay | Admitting: Internal Medicine

## 2022-07-31 DIAGNOSIS — I340 Nonrheumatic mitral (valve) insufficiency: Secondary | ICD-10-CM | POA: Diagnosis not present

## 2022-07-31 DIAGNOSIS — E039 Hypothyroidism, unspecified: Secondary | ICD-10-CM

## 2022-07-31 DIAGNOSIS — I341 Nonrheumatic mitral (valve) prolapse: Secondary | ICD-10-CM

## 2022-07-31 DIAGNOSIS — I10 Essential (primary) hypertension: Secondary | ICD-10-CM | POA: Diagnosis not present

## 2022-07-31 HISTORY — PX: TEE WITHOUT CARDIOVERSION: SHX5443

## 2022-07-31 HISTORY — PX: BUBBLE STUDY: SHX6837

## 2022-07-31 LAB — ECHO TEE: Radius: 0.4 cm

## 2022-07-31 LAB — POCT PREGNANCY, URINE: Preg Test, Ur: NEGATIVE

## 2022-07-31 SURGERY — ECHOCARDIOGRAM, TRANSESOPHAGEAL
Anesthesia: Monitor Anesthesia Care

## 2022-07-31 MED ORDER — PROPOFOL 500 MG/50ML IV EMUL
INTRAVENOUS | Status: DC | PRN
  Administered 2022-07-31: 175 ug/kg/min via INTRAVENOUS

## 2022-07-31 MED ORDER — LIDOCAINE HCL (CARDIAC) PF 100 MG/5ML IV SOSY
PREFILLED_SYRINGE | INTRAVENOUS | Status: DC | PRN
  Administered 2022-07-31: 100 mg via INTRAVENOUS

## 2022-07-31 MED ORDER — PROPOFOL 10 MG/ML IV BOLUS
INTRAVENOUS | Status: DC | PRN
  Administered 2022-07-31: 20 mg via INTRAVENOUS
  Administered 2022-07-31: 30 mg via INTRAVENOUS
  Administered 2022-07-31: 20 mg via INTRAVENOUS
  Administered 2022-07-31: 40 mg via INTRAVENOUS

## 2022-07-31 MED ORDER — ONDANSETRON HCL 4 MG/2ML IJ SOLN
INTRAMUSCULAR | Status: DC | PRN
  Administered 2022-07-31: 4 mg via INTRAVENOUS

## 2022-07-31 MED ORDER — SODIUM CHLORIDE 0.9 % IV SOLN: INTRAVENOUS | Status: DC | PRN

## 2022-07-31 NOTE — Discharge Instructions (Signed)

## 2022-07-31 NOTE — Interval H&P Note (Signed)
History and Physical Interval Note:  07/31/2022 11:04 AM  Alexandria [name]  has presented today for surgery, with the diagnosis of mitral valve prolapse.  The various methods of treatment have been discussed with the patient and family. After consideration of risks, benefits and other options for treatment, the patient has consented to  Procedure(s): TRANSESOPHAGEAL ECHOCARDIOGRAM (TEE) (N/A) as a surgical intervention.  The patient's history has been reviewed, patient examined, no change in status, stable for surgery.  I have reviewed the patient's chart and labs.  Questions were answered to the patient's satisfaction.     Zya Finkle A Gaylan Fauver

## 2022-07-31 NOTE — Transfer of Care (Signed)
Immediate Anesthesia Transfer of Care Note  Patient: Alexandria [name]  Procedure(s) Performed: TRANSESOPHAGEAL ECHOCARDIOGRAM (TEE) BUBBLE STUDY  Patient Location: PACU  Anesthesia Type:MAC  Level of Consciousness: drowsy and patient cooperative  Airway & Oxygen Therapy: Patient Spontanous Breathing  Post-op Assessment: Report given to RN and Post -op Vital signs reviewed and stable  Post vital signs: Reviewed and stable  Last Vitals:  Vitals Value Taken Time  BP 143/82 07/31/22 1151  Temp 36.6 C 07/31/22 1151  Pulse 87 07/31/22 1154  Resp 17 07/31/22 1154  SpO2 91 % 07/31/22 1154  Vitals shown include unvalidated device data.  Last Pain:  Vitals:   07/31/22 1151  TempSrc: Temporal  PainSc: Asleep         Complications: No notable events documented.

## 2022-07-31 NOTE — Anesthesia Postprocedure Evaluation (Signed)
Anesthesia Post Note  Patient: Geneve L Phegley  Procedure(s) Performed: TRANSESOPHAGEAL ECHOCARDIOGRAM (TEE) BUBBLE STUDY     Patient location during evaluation: PACU Anesthesia Type: MAC Level of consciousness: awake and alert Pain management: pain level controlled Vital Signs Assessment: post-procedure vital signs reviewed and stable Respiratory status: spontaneous breathing, nonlabored ventilation, respiratory function stable and patient connected to nasal cannula oxygen Cardiovascular status: stable and blood pressure returned to baseline Postop Assessment: no apparent nausea or vomiting Anesthetic complications: no   No notable events documented.  Last Vitals:  Vitals:   07/31/22 1200 07/31/22 1210  BP: (!) 128/93 130/82  Pulse: 94 85  Resp: (!) 22 13  Temp:    SpO2: 94% 93%    Last Pain:  Vitals:   07/31/22 1210  TempSrc:   PainSc: 0-No pain                 Effie Berkshire

## 2022-07-31 NOTE — CV Procedure (Addendum)
    TRANSESOPHAGEAL ECHOCARDIOGRAM   NAME:  Alexandria [name]    MRN: [id number] DOB:  1981-01-06    ADMIT DATE: 07/31/2022  INDICATIONS: Mitral Regurgitation  PROCEDURE:   Informed consent was obtained prior to the procedure. The risks, benefits and alternatives for the procedure were discussed and the patient comprehended these risks.  Risks include, but are not limited to, cough, sore throat, vomiting, nausea, somnolence, esophageal and stomach trauma or perforation, bleeding, low blood pressure, aspiration, pneumonia, infection, trauma to the teeth and death.    Procedural time out performed. The oropharynx was anesthetized with topical 1% benzocaine.    Anesthesia was administered by Dr. Smith Robert and team.  The patient was administered a total of Propofol 570 mg and  Lidocaine 100 mg, to achieve and maintain moderate to deep conscious sedation.  The patient's heart rate, blood pressure, and oxygen saturation are monitored continuously during the procedure. The period of conscious sedation is 30 minutes, of which I was present face-to-face 100% of this time.   The transesophageal probe was inserted in the esophagus and stomach without difficulty and multiple views were obtained.   COMPLICATIONS:    There were no immediate complications.  KEY FINDINGS:  At most moderate mitral regurgitation, related to bileaflet (predominantly posterior) prolapse. Negative bubble study.  Full report to follow. Further management per primary team.   Rudean Haskell, MD [place]  11:41 AM  Post procedure has emesis that was bilious. Improves with anti-emetic and suctioning.   Post procedural assessment notable for cough but no further emesis. Able to take PO without pain or discomfort.  Discussed post procedural diet.  If continues to improve will still plan for discharge.  Rudean Haskell, MD [place],  [id number] [place], [place] [address] [phone]  12:16 PM

## 2022-07-31 NOTE — Anesthesia Preprocedure Evaluation (Signed)
Anesthesia Evaluation  Patient identified by MRN, date of birth, ID band Patient awake    Reviewed: Allergy & Precautions, NPO status , Patient's Chart, lab work & pertinent test results  Airway Mallampati: III  TM Distance: >3 FB Neck ROM: Full    Dental  (+) Teeth Intact, Dental Advisory Given   Pulmonary    breath sounds clear to auscultation       Cardiovascular hypertension, Pt. on medications and Pt. on home beta blockers  Rhythm:Regular Rate:Normal  Myo Study: Baseline EKG showed NSR with Nonspecific St abnormality .  Exercise capacity was mildly impaired. Patient exercised for 5 min and 30 sec. Maximum HR of 157 bpm. MPHR 88.0 %. Peak METS 7.0 . Hypertensive blood pressure and normal heart rate response noted during stress. .  No ST deviation was noted from baseline during exercise. Arrhythmias during exercise and recovery: occasional PVCs. .  LV perfusion is normal. There is no evidence of ischemia. There is no evidence of infarction. .  Left ventricular function is normal. Nuclear stress EF: caculated at 48 % but appears at least 55%. The left ventricular ejection fraction is mildly decreased (45-54%). End diastolic cavity size is normal. End systolic cavity size is normal. .  Prior study available for comparison from 12/03/2018. .  The study is normal. The study is low risk. .  Consider repeat 2D echo to assess LVF further.    Neuro/Psych  Headaches, PSYCHIATRIC DISORDERS Depression    GI/Hepatic negative GI ROS, Neg liver ROS,   Endo/Other  Hypothyroidism   Renal/GU negative Renal ROS     Musculoskeletal negative musculoskeletal ROS (+)   Abdominal Normal abdominal exam  (+)   Peds  Hematology negative hematology ROS (+)   Anesthesia Other Findings   Reproductive/Obstetrics                            Anesthesia Physical Anesthesia Plan  ASA: 2  Anesthesia Plan: MAC   Post-op  Pain Management:    Induction: Intravenous  PONV Risk Score and Plan: 0 and Propofol infusion  Airway Management Planned: Nasal Cannula and Natural Airway  Additional Equipment: None  Intra-op Plan:   Post-operative Plan:   Informed Consent:   Plan Discussed with: CRNA  Anesthesia Plan Comments:         Anesthesia Quick Evaluation

## 2022-08-01 ENCOUNTER — Encounter (HOSPITAL_COMMUNITY): Payer: Self-pay | Admitting: Internal Medicine

## 2022-09-26 ENCOUNTER — Telehealth: Payer: Self-pay

## 2022-09-26 DIAGNOSIS — I340 Nonrheumatic mitral (valve) insufficiency: Secondary | ICD-10-CM

## 2022-09-26 NOTE — Telephone Encounter (Signed)
-----   Message from Sherren Mocha, MD sent at 09/25/2022  6:39 AM EDT ----- Jasmine Pang - can you put her in for a one year follow-up visit with me with a 2D echo prior to the visit for mitral regurgitation?   thx

## 2022-09-26 NOTE — Telephone Encounter (Signed)
Order placed at this time for ECHO to be done prior to next visit that should occur between July-October 2024. Recall placed for appt as well.

## 2022-11-03 ENCOUNTER — Other Ambulatory Visit (HOSPITAL_COMMUNITY): Payer: Self-pay | Admitting: Internal Medicine

## 2022-11-03 DIAGNOSIS — C73 Malignant neoplasm of thyroid gland: Secondary | ICD-10-CM

## 2023-02-02 ENCOUNTER — Ambulatory Visit (HOSPITAL_COMMUNITY): Payer: Medicaid Other

## 2023-02-02 ENCOUNTER — Encounter (HOSPITAL_COMMUNITY): Payer: Self-pay

## 2023-02-19 ENCOUNTER — Other Ambulatory Visit (HOSPITAL_BASED_OUTPATIENT_CLINIC_OR_DEPARTMENT_OTHER): Payer: Self-pay | Admitting: Internal Medicine

## 2023-02-19 ENCOUNTER — Other Ambulatory Visit (HOSPITAL_COMMUNITY): Payer: Self-pay | Admitting: Internal Medicine

## 2023-02-19 DIAGNOSIS — C770 Secondary and unspecified malignant neoplasm of lymph nodes of head, face and neck: Secondary | ICD-10-CM

## 2023-02-19 DIAGNOSIS — C73 Malignant neoplasm of thyroid gland: Secondary | ICD-10-CM

## 2023-02-21 ENCOUNTER — Other Ambulatory Visit (HOSPITAL_COMMUNITY): Payer: Self-pay | Admitting: Internal Medicine

## 2023-02-21 DIAGNOSIS — C770 Secondary and unspecified malignant neoplasm of lymph nodes of head, face and neck: Secondary | ICD-10-CM

## 2023-02-21 DIAGNOSIS — C73 Malignant neoplasm of thyroid gland: Secondary | ICD-10-CM

## 2023-03-07 ENCOUNTER — Encounter (HOSPITAL_COMMUNITY): Payer: Medicaid Other

## 2023-03-07 ENCOUNTER — Encounter (HOSPITAL_COMMUNITY): Payer: Self-pay

## 2023-03-08 ENCOUNTER — Ambulatory Visit (HOSPITAL_COMMUNITY)
Admission: RE | Admit: 2023-03-08 | Discharge: 2023-03-08 | Disposition: A | Discharge status: Home / Self Care | Payer: Medicaid Other | Source: Ambulatory Visit | Attending: Internal Medicine | Admitting: Internal Medicine

## 2023-03-08 DIAGNOSIS — C73 Malignant neoplasm of thyroid gland: Secondary | ICD-10-CM | POA: Insufficient documentation

## 2023-03-08 LAB — GLUCOSE, CAPILLARY: Glucose-Capillary: 97 mg/dL (ref 70–99)

## 2023-03-08 MED ORDER — FLUDEOXYGLUCOSE F - 18 (FDG) INJECTION
10.5000 | Freq: Once | INTRAVENOUS | Status: AC
  Administered 2023-03-08: 10.5 via INTRAVENOUS

## 2023-06-27 ENCOUNTER — Other Ambulatory Visit: Payer: Self-pay | Admitting: Physician Assistant

## 2023-06-27 ENCOUNTER — Other Ambulatory Visit: Payer: Self-pay | Admitting: Internal Medicine

## 2023-06-27 ENCOUNTER — Ambulatory Visit
Admission: RE | Admit: 2023-06-27 | Discharge: 2023-06-27 | Disposition: A | Discharge status: Home / Self Care | Payer: Medicaid Other | Source: Ambulatory Visit | Attending: Internal Medicine | Admitting: Internal Medicine

## 2023-06-27 DIAGNOSIS — R052 Subacute cough: Secondary | ICD-10-CM

## 2023-06-27 DIAGNOSIS — R911 Solitary pulmonary nodule: Secondary | ICD-10-CM

## 2023-07-03 ENCOUNTER — Ambulatory Visit
Admission: RE | Admit: 2023-07-03 | Discharge: 2023-07-03 | Disposition: A | Discharge status: Home / Self Care | Payer: Medicaid Other | Source: Ambulatory Visit | Attending: Physician Assistant | Admitting: Physician Assistant

## 2023-07-03 DIAGNOSIS — R911 Solitary pulmonary nodule: Secondary | ICD-10-CM

## 2023-07-03 MED ORDER — IOPAMIDOL (ISOVUE-300) INJECTION 61%
75.0000 mL | Freq: Once | INTRAVENOUS | Status: AC | PRN
  Administered 2023-07-03: 75 mL via INTRAVENOUS

## 2023-09-17 ENCOUNTER — Ambulatory Visit (HOSPITAL_COMMUNITY): Payer: Medicaid Other | Attending: Cardiovascular Disease

## 2023-09-17 DIAGNOSIS — I340 Nonrheumatic mitral (valve) insufficiency: Secondary | ICD-10-CM | POA: Diagnosis present

## 2023-09-17 LAB — ECHOCARDIOGRAM COMPLETE
Area-P 1/2: 3.12 cm2
MV M vel: 5.5 m/s
MV Peak grad: 121 mmHg
S' Lateral: 3 cm

## 2023-10-05 ENCOUNTER — Encounter: Payer: Self-pay | Admitting: Cardiovascular Disease

## 2023-10-05 ENCOUNTER — Ambulatory Visit: Payer: Medicaid Other | Attending: Cardiovascular Disease | Admitting: Cardiovascular Disease

## 2023-10-05 VITALS — BP 114/80 | HR 75 | Ht 63.75 in | Wt 226.0 lb

## 2023-10-05 DIAGNOSIS — I10 Essential (primary) hypertension: Secondary | ICD-10-CM | POA: Diagnosis not present

## 2023-10-05 DIAGNOSIS — I340 Nonrheumatic mitral (valve) insufficiency: Secondary | ICD-10-CM | POA: Diagnosis not present

## 2023-10-05 DIAGNOSIS — I341 Nonrheumatic mitral (valve) prolapse: Secondary | ICD-10-CM | POA: Diagnosis not present

## 2023-10-05 NOTE — Progress Notes (Signed)
Cardiology Office Note:    Date:  10/05/2023   ID:  KALYNN DECLERCQ, DOB 16-Aug-1981, MRN [id number]  PCP:  [facility]   [facility] Providers Cardiologist:  None     Referring MD: [facility]   Chief Complaint  Patient presents with   Fatigue    History of Present Illness:    Neah Sporrer Nance is a 42 y.o. female with a hx of mitral regurgitation and hypertension, presenting for follow-up evaluation.  The patient was initially seen in 2023 and underwent transesophageal echo for assessment of mitral valve prolapse with mitral regurgitation.  This demonstrated normal LV EF and moderate mitral regurgitation secondary to bileaflet prolapse appropriate for ongoing surveillance and medical therapy.  She has a strong family history of hypertension.  The patient is here alone today.  She is doing pretty well.  Her youngest son is a Holiday representative in high school and plans on attending [employer] next year.  He is an Radio producer.  Her older son lives locally in [place].  She is having some problems with her weight and complains of poor sleep and fatigue.  She is working with the [employer] clinic and plans on starting [employer] in the near future.  She denies chest pain or chest pressure.  No heart palpitations or leg edema.  She has mild shortness of breath with climbing stairs but feels like this is improved from last year when I initially saw her.  Current Medications: Current Meds  Medication Sig   amLODipine (NORVASC) 5 MG tablet Take 5 mg by mouth daily.   carvedilol (COREG) 12.5 MG tablet Take 12.5 mg by mouth 2 (two) times daily.   Cholecalciferol 25 MCG (1000 UT) CHEW Chew 2,000 Units by mouth daily.   eletriptan (RELPAX) 40 MG tablet TAKE 1 TABLET BY MOUTH AS NEEDED FOR MIGRAINE   fexofenadine (ALLEGRA) 180 MG tablet Take 180 mg by mouth daily as needed for allergies or rhinitis.   fluticasone (FLONASE) 50 MCG/ACT nasal spray Place  1 spray into both nostrils daily as needed for allergies or rhinitis.   liothyronine (CYTOMEL) 25 MCG tablet Take 12.5 mcg by mouth in the morning and at bedtime.   Multiple Vitamins-Minerals (MULTIVITAMIN) LIQD Take 1 tablet by mouth daily.   olmesartan-hydrochlorothiazide (BENICAR HCT) 40-25 MG tablet Take 1 tablet by mouth daily.   omeprazole (PRILOSEC) 40 MG capsule Take 40 mg by mouth daily.   OVER THE COUNTER MEDICATION Take 3 capsules by mouth daily. BlossoME Women's Supplement   SYNTHROID 75 MCG tablet Take 75 mcg by mouth daily.   WEGOVY 0.25 MG/0.5ML SOAJ Inject 0.25 mg into the skin once a week.     Allergies:   Penicillins   ROS:   Please see the history of present illness.    All other systems reviewed and are negative.  EKGs/Labs/Other Studies Reviewed:    The following studies were reviewed today: Cardiac Studies & Procedures     STRESS TESTS  MYOCARDIAL PERFUSION IMAGING 03/20/2022  Narrative   Baseline EKG showed NSR with Nonspecific St abnormality   Exercise capacity was mildly impaired. Patient exercised for 5 min and 30 sec. Maximum HR of 157 bpm. MPHR 88.0 %. Peak METS 7.0 . Hypertensive blood pressure and normal heart rate response noted during stress.   No ST deviation was noted from baseline during exercise. Arrhythmias during exercise and recovery: occasional PVCs.   LV perfusion is normal. There is  Cardiology Office Note:    Date:  10/05/2023   ID:  KALYNN DECLERCQ, DOB 16-Aug-1981, MRN [id number]  PCP:  [facility]   [facility] Providers Cardiologist:  None     Referring MD: [facility]   Chief Complaint  Patient presents with   Fatigue    History of Present Illness:    Neah Sporrer Nance is a 42 y.o. female with a hx of mitral regurgitation and hypertension, presenting for follow-up evaluation.  The patient was initially seen in 2023 and underwent transesophageal echo for assessment of mitral valve prolapse with mitral regurgitation.  This demonstrated normal LV EF and moderate mitral regurgitation secondary to bileaflet prolapse appropriate for ongoing surveillance and medical therapy.  She has a strong family history of hypertension.  The patient is here alone today.  She is doing pretty well.  Her youngest son is a Holiday representative in high school and plans on attending [employer] next year.  He is an Radio producer.  Her older son lives locally in [place].  She is having some problems with her weight and complains of poor sleep and fatigue.  She is working with the [employer] clinic and plans on starting [employer] in the near future.  She denies chest pain or chest pressure.  No heart palpitations or leg edema.  She has mild shortness of breath with climbing stairs but feels like this is improved from last year when I initially saw her.  Current Medications: Current Meds  Medication Sig   amLODipine (NORVASC) 5 MG tablet Take 5 mg by mouth daily.   carvedilol (COREG) 12.5 MG tablet Take 12.5 mg by mouth 2 (two) times daily.   Cholecalciferol 25 MCG (1000 UT) CHEW Chew 2,000 Units by mouth daily.   eletriptan (RELPAX) 40 MG tablet TAKE 1 TABLET BY MOUTH AS NEEDED FOR MIGRAINE   fexofenadine (ALLEGRA) 180 MG tablet Take 180 mg by mouth daily as needed for allergies or rhinitis.   fluticasone (FLONASE) 50 MCG/ACT nasal spray Place  1 spray into both nostrils daily as needed for allergies or rhinitis.   liothyronine (CYTOMEL) 25 MCG tablet Take 12.5 mcg by mouth in the morning and at bedtime.   Multiple Vitamins-Minerals (MULTIVITAMIN) LIQD Take 1 tablet by mouth daily.   olmesartan-hydrochlorothiazide (BENICAR HCT) 40-25 MG tablet Take 1 tablet by mouth daily.   omeprazole (PRILOSEC) 40 MG capsule Take 40 mg by mouth daily.   OVER THE COUNTER MEDICATION Take 3 capsules by mouth daily. BlossoME Women's Supplement   SYNTHROID 75 MCG tablet Take 75 mcg by mouth daily.   WEGOVY 0.25 MG/0.5ML SOAJ Inject 0.25 mg into the skin once a week.     Allergies:   Penicillins   ROS:   Please see the history of present illness.    All other systems reviewed and are negative.  EKGs/Labs/Other Studies Reviewed:    The following studies were reviewed today: Cardiac Studies & Procedures     STRESS TESTS  MYOCARDIAL PERFUSION IMAGING 03/20/2022  Narrative   Baseline EKG showed NSR with Nonspecific St abnormality   Exercise capacity was mildly impaired. Patient exercised for 5 min and 30 sec. Maximum HR of 157 bpm. MPHR 88.0 %. Peak METS 7.0 . Hypertensive blood pressure and normal heart rate response noted during stress.   No ST deviation was noted from baseline during exercise. Arrhythmias during exercise and recovery: occasional PVCs.   LV perfusion is normal. There is  Cardiology Office Note:    Date:  10/05/2023   ID:  KALYNN DECLERCQ, DOB 16-Aug-1981, MRN [id number]  PCP:  [facility]   [facility] Providers Cardiologist:  None     Referring MD: [facility]   Chief Complaint  Patient presents with   Fatigue    History of Present Illness:    Neah Sporrer Nance is a 42 y.o. female with a hx of mitral regurgitation and hypertension, presenting for follow-up evaluation.  The patient was initially seen in 2023 and underwent transesophageal echo for assessment of mitral valve prolapse with mitral regurgitation.  This demonstrated normal LV EF and moderate mitral regurgitation secondary to bileaflet prolapse appropriate for ongoing surveillance and medical therapy.  She has a strong family history of hypertension.  The patient is here alone today.  She is doing pretty well.  Her youngest son is a Holiday representative in high school and plans on attending [employer] next year.  He is an Radio producer.  Her older son lives locally in [place].  She is having some problems with her weight and complains of poor sleep and fatigue.  She is working with the [employer] clinic and plans on starting [employer] in the near future.  She denies chest pain or chest pressure.  No heart palpitations or leg edema.  She has mild shortness of breath with climbing stairs but feels like this is improved from last year when I initially saw her.  Current Medications: Current Meds  Medication Sig   amLODipine (NORVASC) 5 MG tablet Take 5 mg by mouth daily.   carvedilol (COREG) 12.5 MG tablet Take 12.5 mg by mouth 2 (two) times daily.   Cholecalciferol 25 MCG (1000 UT) CHEW Chew 2,000 Units by mouth daily.   eletriptan (RELPAX) 40 MG tablet TAKE 1 TABLET BY MOUTH AS NEEDED FOR MIGRAINE   fexofenadine (ALLEGRA) 180 MG tablet Take 180 mg by mouth daily as needed for allergies or rhinitis.   fluticasone (FLONASE) 50 MCG/ACT nasal spray Place  1 spray into both nostrils daily as needed for allergies or rhinitis.   liothyronine (CYTOMEL) 25 MCG tablet Take 12.5 mcg by mouth in the morning and at bedtime.   Multiple Vitamins-Minerals (MULTIVITAMIN) LIQD Take 1 tablet by mouth daily.   olmesartan-hydrochlorothiazide (BENICAR HCT) 40-25 MG tablet Take 1 tablet by mouth daily.   omeprazole (PRILOSEC) 40 MG capsule Take 40 mg by mouth daily.   OVER THE COUNTER MEDICATION Take 3 capsules by mouth daily. BlossoME Women's Supplement   SYNTHROID 75 MCG tablet Take 75 mcg by mouth daily.   WEGOVY 0.25 MG/0.5ML SOAJ Inject 0.25 mg into the skin once a week.     Allergies:   Penicillins   ROS:   Please see the history of present illness.    All other systems reviewed and are negative.  EKGs/Labs/Other Studies Reviewed:    The following studies were reviewed today: Cardiac Studies & Procedures     STRESS TESTS  MYOCARDIAL PERFUSION IMAGING 03/20/2022  Narrative   Baseline EKG showed NSR with Nonspecific St abnormality   Exercise capacity was mildly impaired. Patient exercised for 5 min and 30 sec. Maximum HR of 157 bpm. MPHR 88.0 %. Peak METS 7.0 . Hypertensive blood pressure and normal heart rate response noted during stress.   No ST deviation was noted from baseline during exercise. Arrhythmias during exercise and recovery: occasional PVCs.   LV perfusion is normal. There is  Cardiology Office Note:    Date:  10/05/2023   ID:  KALYNN DECLERCQ, DOB 16-Aug-1981, MRN [id number]  PCP:  [facility]   [facility] Providers Cardiologist:  None     Referring MD: [facility]   Chief Complaint  Patient presents with   Fatigue    History of Present Illness:    Neah Sporrer Nance is a 42 y.o. female with a hx of mitral regurgitation and hypertension, presenting for follow-up evaluation.  The patient was initially seen in 2023 and underwent transesophageal echo for assessment of mitral valve prolapse with mitral regurgitation.  This demonstrated normal LV EF and moderate mitral regurgitation secondary to bileaflet prolapse appropriate for ongoing surveillance and medical therapy.  She has a strong family history of hypertension.  The patient is here alone today.  She is doing pretty well.  Her youngest son is a Holiday representative in high school and plans on attending [employer] next year.  He is an Radio producer.  Her older son lives locally in [place].  She is having some problems with her weight and complains of poor sleep and fatigue.  She is working with the [employer] clinic and plans on starting [employer] in the near future.  She denies chest pain or chest pressure.  No heart palpitations or leg edema.  She has mild shortness of breath with climbing stairs but feels like this is improved from last year when I initially saw her.  Current Medications: Current Meds  Medication Sig   amLODipine (NORVASC) 5 MG tablet Take 5 mg by mouth daily.   carvedilol (COREG) 12.5 MG tablet Take 12.5 mg by mouth 2 (two) times daily.   Cholecalciferol 25 MCG (1000 UT) CHEW Chew 2,000 Units by mouth daily.   eletriptan (RELPAX) 40 MG tablet TAKE 1 TABLET BY MOUTH AS NEEDED FOR MIGRAINE   fexofenadine (ALLEGRA) 180 MG tablet Take 180 mg by mouth daily as needed for allergies or rhinitis.   fluticasone (FLONASE) 50 MCG/ACT nasal spray Place  1 spray into both nostrils daily as needed for allergies or rhinitis.   liothyronine (CYTOMEL) 25 MCG tablet Take 12.5 mcg by mouth in the morning and at bedtime.   Multiple Vitamins-Minerals (MULTIVITAMIN) LIQD Take 1 tablet by mouth daily.   olmesartan-hydrochlorothiazide (BENICAR HCT) 40-25 MG tablet Take 1 tablet by mouth daily.   omeprazole (PRILOSEC) 40 MG capsule Take 40 mg by mouth daily.   OVER THE COUNTER MEDICATION Take 3 capsules by mouth daily. BlossoME Women's Supplement   SYNTHROID 75 MCG tablet Take 75 mcg by mouth daily.   WEGOVY 0.25 MG/0.5ML SOAJ Inject 0.25 mg into the skin once a week.     Allergies:   Penicillins   ROS:   Please see the history of present illness.    All other systems reviewed and are negative.  EKGs/Labs/Other Studies Reviewed:    The following studies were reviewed today: Cardiac Studies & Procedures     STRESS TESTS  MYOCARDIAL PERFUSION IMAGING 03/20/2022  Narrative   Baseline EKG showed NSR with Nonspecific St abnormality   Exercise capacity was mildly impaired. Patient exercised for 5 min and 30 sec. Maximum HR of 157 bpm. MPHR 88.0 %. Peak METS 7.0 . Hypertensive blood pressure and normal heart rate response noted during stress.   No ST deviation was noted from baseline during exercise. Arrhythmias during exercise and recovery: occasional PVCs.   LV perfusion is normal. There is  Cardiology Office Note:    Date:  10/05/2023   ID:  KALYNN DECLERCQ, DOB 16-Aug-1981, MRN [id number]  PCP:  [facility]   [facility] Providers Cardiologist:  None     Referring MD: [facility]   Chief Complaint  Patient presents with   Fatigue    History of Present Illness:    Neah Sporrer Nance is a 42 y.o. female with a hx of mitral regurgitation and hypertension, presenting for follow-up evaluation.  The patient was initially seen in 2023 and underwent transesophageal echo for assessment of mitral valve prolapse with mitral regurgitation.  This demonstrated normal LV EF and moderate mitral regurgitation secondary to bileaflet prolapse appropriate for ongoing surveillance and medical therapy.  She has a strong family history of hypertension.  The patient is here alone today.  She is doing pretty well.  Her youngest son is a Holiday representative in high school and plans on attending [employer] next year.  He is an Radio producer.  Her older son lives locally in [place].  She is having some problems with her weight and complains of poor sleep and fatigue.  She is working with the [employer] clinic and plans on starting [employer] in the near future.  She denies chest pain or chest pressure.  No heart palpitations or leg edema.  She has mild shortness of breath with climbing stairs but feels like this is improved from last year when I initially saw her.  Current Medications: Current Meds  Medication Sig   amLODipine (NORVASC) 5 MG tablet Take 5 mg by mouth daily.   carvedilol (COREG) 12.5 MG tablet Take 12.5 mg by mouth 2 (two) times daily.   Cholecalciferol 25 MCG (1000 UT) CHEW Chew 2,000 Units by mouth daily.   eletriptan (RELPAX) 40 MG tablet TAKE 1 TABLET BY MOUTH AS NEEDED FOR MIGRAINE   fexofenadine (ALLEGRA) 180 MG tablet Take 180 mg by mouth daily as needed for allergies or rhinitis.   fluticasone (FLONASE) 50 MCG/ACT nasal spray Place  1 spray into both nostrils daily as needed for allergies or rhinitis.   liothyronine (CYTOMEL) 25 MCG tablet Take 12.5 mcg by mouth in the morning and at bedtime.   Multiple Vitamins-Minerals (MULTIVITAMIN) LIQD Take 1 tablet by mouth daily.   olmesartan-hydrochlorothiazide (BENICAR HCT) 40-25 MG tablet Take 1 tablet by mouth daily.   omeprazole (PRILOSEC) 40 MG capsule Take 40 mg by mouth daily.   OVER THE COUNTER MEDICATION Take 3 capsules by mouth daily. BlossoME Women's Supplement   SYNTHROID 75 MCG tablet Take 75 mcg by mouth daily.   WEGOVY 0.25 MG/0.5ML SOAJ Inject 0.25 mg into the skin once a week.     Allergies:   Penicillins   ROS:   Please see the history of present illness.    All other systems reviewed and are negative.  EKGs/Labs/Other Studies Reviewed:    The following studies were reviewed today: Cardiac Studies & Procedures     STRESS TESTS  MYOCARDIAL PERFUSION IMAGING 03/20/2022  Narrative   Baseline EKG showed NSR with Nonspecific St abnormality   Exercise capacity was mildly impaired. Patient exercised for 5 min and 30 sec. Maximum HR of 157 bpm. MPHR 88.0 %. Peak METS 7.0 . Hypertensive blood pressure and normal heart rate response noted during stress.   No ST deviation was noted from baseline during exercise. Arrhythmias during exercise and recovery: occasional PVCs.   LV perfusion is normal. There is  is trivial. No evidence of pulmonic stenosis.  Aorta: The aortic root is normal in size and structure.  Venous: The inferior vena cava is normal in size with greater than 50% respiratory variability, suggesting right atrial pressure of 3 mmHg.  IAS/Shunts: No atrial level shunt detected by color flow Doppler.  Additional Comments: Prolapse of posterior MV leaflet with moderate MR.   LEFT VENTRICLE PLAX 2D LVIDd:         4.70 cm   Diastology LVIDs:         3.00 cm   LV e' medial:    7.64 cm/s LV PW:         1.00 cm   LV E/e' medial:  9.4 LV IVS:        1.10 cm   LV e' lateral:   11.75 cm/s LVOT diam:     2.30 cm   LV E/e' lateral: 6.1 LV SV:         71 LV SV Index:   35 LVOT Area:     4.15 cm   RIGHT VENTRICLE             IVC RV Basal diam:  3.80 cm     IVC diam: 1.60 cm RV S prime:     14.40 cm/s TAPSE (M-mode): 2.5 cm  LEFT ATRIUM             Index        RIGHT ATRIUM          Index LA diam:        4.30 cm 2.16 cm/m   RA Area:     9.16 cm LA Vol (A2C):   54.4 ml 27.27 ml/m  RA Volume:   19.00 ml 9.52 ml/m LA Vol (A4C):    55.6 ml 27.87 ml/m LA Biplane Vol: 57.9 ml 29.02 ml/m AORTIC VALVE LVOT Vmax:   87.77 cm/s LVOT Vmean:  58.067 cm/s LVOT VTI:    0.170 m  AORTA Ao Root diam: 3.30 cm Ao Asc diam:  3.30 cm  MITRAL VALVE               TRICUSPID VALVE MV Area (PHT): 3.12 cm    TR Peak grad:   23.0 mmHg MV Decel Time: 243 msec    TR Vmax:        240.00 cm/s MR Peak grad: 121.0 mmHg MR Mean grad: 82.0 mmHg    SHUNTS MR Vmax:      550.00 cm/s  Systemic VTI:  0.17 m MR Vmean:     424.0 cm/s   Systemic Diam: 2.30 cm MV E velocity: 72.05 cm/s MV A velocity: 80.70 cm/s MV E/A ratio:  0.89  Olga Millers MD Electronically signed by Olga Millers MD Signature Date/Time: 09/17/2023/12:59:00 PM    Final   TEE  ECHO TEE 07/31/2022  Narrative TRANSESOPHOGEAL ECHO REPORT    Patient Name:   TASHANTI DALPORTO Gadway Date of Exam: 07/31/2022 Medical Rec #:  [id number]      Height:       63.7 in Accession #:    [id number]     Weight:       211.0 lb Date of Birth:  10-02-1981      BSA:          1.995 m Patient Age:    41 years       BP:           156/91 mmHg Patient Gender: F

## 2023-10-05 NOTE — Patient Instructions (Signed)
Medication Instructions:  Your physician recommends that you continue on your current medications as directed. Please refer to the Current Medication list given to you today.  *If you need a refill on your cardiac medications before your next appointment, please call your pharmacy*   Lab Work: NONE If you have labs (blood work) drawn today and your tests are completely normal, you will receive your results only by: MyChart Message (if you have MyChart) OR A paper copy in the mail If you have any lab test that is abnormal or we need to change your treatment, we will call you to review the results.   Testing/Procedures: ECHO Your physician has requested that you have an echocardiogram. Echocardiography is a painless test that uses sound waves to create images of your heart. It provides your doctor with information about the size and shape of your heart and how well your heart's chambers and valves are working. This procedure takes approximately one hour. There are no restrictions for this procedure. Please do NOT wear cologne, perfume, aftershave, or lotions (deodorant is allowed). Please arrive 15 minutes prior to your appointment time.  Follow-Up: At [facility], you and your health needs are our priority.  As part of our continuing mission to provide you with exceptional heart care, we have created designated Provider Care Teams.  These Care Teams include your primary Cardiologist (physician) and Advanced Practice Providers (APPs -  Physician Assistants and Nurse Practitioners) who all work together to provide you with the care you need, when you need it.  Your next appointment:   1 year(s)  Provider:   Timber Cooper, MD     

## 2023-10-05 NOTE — Assessment & Plan Note (Signed)
The patient has moderate mitral regurgitation from mitral valve prolapse.  I reviewed her most recent echo that shows normal LVEF with moderate MR.  Will plan to repeat this in 1 year.  Discussed the importance of regular exercise with aerobic activity.  She understands to avoid heavy isometric lifting.  She is counseled about the natural history of mitral valve prolapse of mitral regurgitation.  She understands that we will follow her with serial exams and imaging and she will watch for symptoms that could be attributed to progressive mitral regurgitation.

## 2023-12-31 ENCOUNTER — Ambulatory Visit: Payer: Medicaid Other | Admitting: Cardiovascular Disease

## 2024-09-04 ENCOUNTER — Other Ambulatory Visit: Payer: Self-pay

## 2024-09-04 DIAGNOSIS — I340 Nonrheumatic mitral (valve) insufficiency: Secondary | ICD-10-CM

## 2024-09-04 DIAGNOSIS — I341 Nonrheumatic mitral (valve) prolapse: Secondary | ICD-10-CM

## 2024-09-15 ENCOUNTER — Encounter: Payer: Self-pay | Admitting: Physician Assistant

## 2024-09-15 ENCOUNTER — Ambulatory Visit (INDEPENDENT_AMBULATORY_CARE_PROVIDER_SITE_OTHER): Admitting: Physician Assistant

## 2024-09-15 VITALS — BP 134/90 | HR 93

## 2024-09-15 DIAGNOSIS — D239 Other benign neoplasm of skin, unspecified: Secondary | ICD-10-CM

## 2024-09-15 DIAGNOSIS — L853 Xerosis cutis: Secondary | ICD-10-CM

## 2024-09-15 DIAGNOSIS — R202 Paresthesia of skin: Secondary | ICD-10-CM | POA: Diagnosis not present

## 2024-09-15 MED ORDER — TRIAMCINOLONE ACETONIDE 0.1 % EX OINT: 1.0000 | TOPICAL_OINTMENT | Freq: Two times a day (BID) | CUTANEOUS | 2 refills | Status: DC | PRN

## 2024-09-15 NOTE — Patient Instructions (Addendum)
 NOTALGIA PARESTHETICA Exam: Perispinal hyperpigmented patch Chronic condition without cure secondary to pinched nerve along spine causing itching or sensation changes in an area of skin. Chronic rubbing or scratching causes darkening of the skin.  OTC treatments which can help with itch include numbing creams like pramoxine or lidocaine  which temporarily reduce itch or Capsaicin-containing creams which cause a burning sensation but which sometimes over time will reset the nerves to stop producing itch.  If you choose to use Capsaicin cream, it is recommended to use it 5 times daily for 1 week followed by 3 times daily for 3-6 weeks. You may have to continue using it long-term.  If not doing well with OTC options, could consider [employer] compounded prescription anti-itch cream with Amitriptyline 5% / Lidocaine  5% / Pramoxine 1% or Amitriptyline 5% / Gabapentin 10% / Lidocaine  5% Cream or other prescription cream or pill options.     DERMATOFIBROMA Exam: Firm pink/brown papulenodule with dimple sign.   Treatment Plan: A dermatofibroma is a benign growth possibly related to trauma, such as an insect bite, cut from shaving, or inflamed acne-type bump.  Treatment options to remove include shave or excision with resulting scar and risk of recurrence.  Since benign-appearing and not bothersome, will observe for now.                Important Information  Due to recent changes in healthcare laws, you may see results of your pathology and/or laboratory studies on MyChart before the doctors have had a chance to review them. We understand that in some cases there may be results that are confusing or concerning to you. Please understand that not all results are received at the same time and often the doctors may need to interpret multiple results in order to provide you with the best plan of care or course of treatment. Therefore, we ask that you please give us  2 business days to thoroughly review  all your results before contacting the office for clarification. Should we see a critical lab result, you will be contacted sooner.   If You Need Anything After Your Visit  If you have any questions or concerns for your doctor, please call our main line at [phone] If no one answers, please leave a voicemail as directed and we will return your call as soon as possible. Messages left after 4 pm will be answered the following business day.   You may also send us  a message via MyChart. We typically respond to MyChart messages within 1-2 business days.  For prescription refills, please ask your pharmacy to contact our office. Our fax number is [phone].  If you have an urgent issue when the clinic is closed that cannot wait until the next business day, you can page your doctor at the number below.    Please note that while we do our best to be available for urgent issues outside of office hours, we are not available 24/7.   If you have an urgent issue and are unable to reach us , you may choose to seek medical care at your doctor's office, retail clinic, urgent care center, or emergency room.  If you have a medical emergency, please immediately call 911 or go to the emergency department. In the event of inclement weather, please call our main line at [phone] for an update on the status of any delays or closures.  Dermatology Medication Tips: Please keep the boxes that topical medications come in in order to help keep track of  the instructions about where and how to use these. Pharmacies typically print the medication instructions only on the boxes and not directly on the medication tubes.   If your medication is too expensive, please contact our office at [phone] or send us  a message through MyChart.   We are unable to tell what your co-pay for medications will be in advance as this is different depending on your insurance coverage. However, we may be able to find a substitute  medication at lower cost or fill out paperwork to get insurance to cover a needed medication.   If a prior authorization is required to get your medication covered by your insurance company, please allow us  1-2 business days to complete this process.  Drug prices often vary depending on where the prescription is filled and some pharmacies may offer cheaper prices.  The website www.goodrx.com contains coupons for medications through different pharmacies. The prices here do not account for what the cost may be with help from insurance (it may be cheaper with your insurance), but the website can give you the price if you did not use any insurance.  - You can print the associated coupon and take it with your prescription to the pharmacy.  - You may also stop by our office during regular business hours and pick up a GoodRx coupon card.  - If you need your prescription sent electronically to a different pharmacy, notify our office through [facility] or by phone at [phone]

## 2024-09-15 NOTE — Progress Notes (Signed)
   New Patient Visit   Subjective  Abena Erdman Hanks is a 43 y.o. female NEW PATIENT who presents for the following: Spot check/rash  Patient states she has spot located at the shoulder and rash on her left upper back  that she would like to have examined. Patient reports the areas have been there for 7 years. She reports the areas are bothersome, patient reports that the place on her back gets really itchy and irritated. Patient rates irritation 7 out of 10. She states that the areas have spread. Patient reports she has not previously been treated for these areas. Patient admits Hx of bx that was done under her left breast, came back normal for a mole. Patient denies family history of skin cancer(s).  Other concern: dry skin as she is peri-menopausal.   The following portions of the chart were reviewed this encounter and updated as appropriate: medications, allergies, medical history  Review of Systems:  No other skin or systemic complaints except as noted in HPI or Assessment and Plan.  Objective  Well appearing patient in no apparent distress; mood and affect are within normal limits.  A focused examination was performed of the following areas: Back  Relevant exam findings are noted in the Assessment and Plan.        Assessment & Plan   NOTALGIA PARESTHETICA Exam: Perispinal hyperpigmented patch Chronic condition without cure secondary to pinched nerve along spine causing itching or sensation changes in an area of skin. Chronic rubbing or scratching causes darkening of the skin.  OTC treatments which can help with itch include numbing creams like pramoxine or lidocaine  which temporarily reduce itch or Capsaicin-containing creams which cause a burning sensation but which sometimes over time will reset the nerves to stop producing itch.  If you choose to use Capsaicin cream, it is recommended to use it 5 times daily for 1 week followed by 3 times daily for 3-6 weeks. You may have to  continue using it long-term.  If not doing well with OTC options, could consider [employer] compounded prescription anti-itch cream with Amitriptyline 5% / Lidocaine  5% / Pramoxine 1% or Amitriptyline 5% / Gabapentin 10% / Lidocaine  5% Cream or other prescription cream or pill options.    DERMATOFIBROMA Exam: Firm pink/brown papulenodule with dimple sign.  Treatment Plan: A dermatofibroma is a benign growth possibly related to trauma, such as an insect bite, cut from shaving, or inflamed acne-type bump.  Treatment options to remove include shave or excision with resulting scar and risk of recurrence.  Since benign-appearing and not bothersome, will observe for now.    XEROSIS CUTIS  - recommend moisturizers and gentle skin care to include Vanicream gentle face/body wash      DERMATOFIBROMA   Related Medications triamcinolone  ointment (KENALOG ) 0.1 % Apply 1 Application topically 2 (two) times daily as needed (Rash). NOTALGIA PARESTHETICA   XEROSIS CUTIS    Return if symptoms worsen or fail to improve.  I, Doyce Pan, CMA, am acting as scribe for Jannie Doyle K, PA-C.   Documentation: I have reviewed the above documentation for accuracy and completeness, and I agree with the above.  Seraphine Gudiel K, PA-C

## 2024-09-22 ENCOUNTER — Other Ambulatory Visit: Payer: Self-pay | Admitting: Internal Medicine

## 2024-09-22 DIAGNOSIS — C73 Malignant neoplasm of thyroid gland: Secondary | ICD-10-CM

## 2024-09-22 DIAGNOSIS — C770 Secondary and unspecified malignant neoplasm of lymph nodes of head, face and neck: Secondary | ICD-10-CM

## 2024-10-01 NOTE — Progress Notes (Signed)
 43 y.o. [id number] female with HTN, MVP (follows with cardiology annually), h/o thyroid  cancer (2009) here for surgical consult for LLQ abdominal pain dyspareunia ovarian cyst and fibroids. Married.  Patient's last menstrual period was 09/11/2024 (exact date). Period Duration (Days): 5 Period Pattern: Regular Menstrual Flow: Moderate Menstrual Control: Maxi pad, Tampon Dysmenorrhea: (!) Moderate Dysmenorrhea Symptoms: Nausea, Diarrhea, Headache  She reports LLQ pain and dyspareunia in the same location the week before her cycles. Pain has been going on for years. Initially, the pain started at the same time as a diverticulitis flare.  The pain improved once the flare resolved. It has returned over the last couple of months.  She notes intermittent constipation.  She is working on improving her diet with the weight management clinic.  Her periods have also become heavier with cycle day 2 and 3 requiring multiple tampons and pads.  She also notes premenstrual migraines (though these have improved over time) and mood changes.  She has tried multiple kinds of hormonal therapy over the years with poorly controlled symptoms.  She last tried an IUD and her periods were lighter but not resolved.  Per referral: 07/22/2024 Pap N IL, HPV negative; FSH, estradiol, progesterone wnl, total testosterone 59 (slightly elevated) 08/14/2024 TVUS: 5 intramural fibroids noted, the largest measuring 2.6 cm.  Complex cyst of bilateral ovaries, measuring 1 cm (right) and 1.7 cm (left). 2024 TVUS: Normal  Last Hgb on records 2023 wnl  Birth control: None Last mammogram: 01/13/21 Sexually active: Yes   GYN HISTORY: Ectopic pregnancy treated with MTX  OB History  Gravida Para Term Preterm AB Living  3 2 2  1 2   SAB IAB Ectopic Multiple Live Births  1    2    # Outcome Date GA Lbr Len/2nd Weight Sex Type Anes PTL Lv  3 Term 05/2011    M Vag-Spont   LIV  2 Term 10/1999 [redacted]w[redacted]d   M Vag-Spont EPI N LIV  1  SAB            Past Medical History:  Diagnosis Date   Allergy    BMI 34.0-34.9,adult    COVID-19 virus infection 09/2020   Depressed left ventricular ejection fraction    Diverticulitis    Hypertension    Hypertension    Intractable migraine without aura and without status migrainosus    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 Lindy   Mitral valve prolapse    Obesity (BMI 30-39.9)    Ovarian cyst    Papillary carcinoma of thyroid  (HCC)    Postsurgical hypothyroidism    Thyroid  cancer (HCC)    Thyroid    Trouble staying asleep    Past Surgical History:  Procedure Laterality Date   BUBBLE STUDY  07/31/2022   Procedure: BUBBLE STUDY;  Surgeon: Santo Stanly LABOR, MD;  Location: [facility] ENDOSCOPY;  Service: Cardiovascular;;   GALLBLADDER SURGERY     left leg surgery     parathyroid gland     TEE WITHOUT CARDIOVERSION N/A 07/31/2022   Procedure: TRANSESOPHAGEAL ECHOCARDIOGRAM (TEE);  Surgeon: Santo Stanly LABOR, MD;  Location[facility] ENDOSCOPY;  Service: Cardiovascular;  Laterality: N/A;   thyroid  removed     Current Outpatient Medications on File Prior to Visit  Medication Sig Dispense Refill   amLODipine (NORVASC) 5 MG tablet Take 5 mg by mouth daily.     Calcium Carbonate Antacid (ANTACID CALCIUM EXTRA STRENGTH PO) Take 2 tablets by mouth at bedtime  as needed (heartburn).     carvedilol (COREG) 12.5 MG tablet Take 12.5 mg by mouth 2 (two) times daily.     Cholecalciferol 25 MCG (1000 UT) CHEW Chew 2,000 Units by mouth daily.     ELDERBERRY PO Take by mouth.     fexofenadine (ALLEGRA) 180 MG tablet Take 180 mg by mouth daily as needed for allergies or rhinitis.     fluticasone (FLONASE) 50 MCG/ACT nasal spray Place 1 spray into both nostrils daily as needed for allergies or rhinitis.     liothyronine (CYTOMEL) 25 MCG tablet Take 12.5 mcg by mouth in the morning and at bedtime.     Multiple Vitamins-Minerals (MULTIVITAMIN) LIQD Take 1 tablet  by mouth daily.     olmesartan-hydrochlorothiazide  (BENICAR HCT) 40-25 MG tablet Take 1 tablet by mouth daily.     omeprazole  (PRILOSEC) 40 MG capsule Take 40 mg by mouth daily.     SYNTHROID  75 MCG tablet Take 75 mcg by mouth daily.     WEGOVY 0.25 MG/0.5ML SOAJ Inject 0.25 mg into the skin once a week.     No current facility-administered medications on file prior to visit.   Allergies  Allergen Reactions   Penicillins Other (See Comments)    Has not had since child, thinks she had reaction to it then    PE Today's Vitals   10/02/24 1354  BP: 104/78  Pulse: 83  Temp: 97.7 F (36.5 C)  TempSrc: Oral  SpO2: 98%  Weight: 217 lb (98.4 kg)  Height: 5' 3.75 (1.619 m)   Body mass index is 37.54 kg/m.  Physical Exam Vitals reviewed. Exam conducted with a chaperone present.  Constitutional:      General: She is not in acute distress.    Appearance: Normal appearance.  HENT:     Head: Normocephalic and atraumatic.     Nose: Nose normal.  Eyes:     Extraocular Movements: Extraocular movements intact.     Conjunctiva/sclera: Conjunctivae normal.  Pulmonary:     Effort: Pulmonary effort is normal.  Genitourinary:    General: Normal vulva.     Exam position: Lithotomy position.     Vagina: Normal. No vaginal discharge.     Cervix: Normal. No cervical motion tenderness, discharge or lesion.     Uterus: Normal. Not enlarged and not tender.      Adnexa: Right adnexa normal and left adnexa normal.  Musculoskeletal:        General: Normal range of motion.     Cervical back: Normal range of motion.  Neurological:     General: No focal deficit present.     Mental Status: She is alert.  Psychiatric:        Mood and Affect: Mood normal.        Behavior: Behavior normal.      Assessment and Plan:        Abnormal uterine bleeding (AUB) Assessment & Plan: AUB, multifactorial- 5 intramural fibroids measuring 2.6cm or less; perimenopause First line therapies reviewed,  including hormonal theray. Also discussed endometrial ablation, uterine artery embolization and hysterectomy. However, given pain component, I would only recommend hysterectomy for surgical management. This would also help r/o endometriosis given clinical presentation and h/o ectopic pregnancy. Patient is interested in hormonal therapy, discussed that this would treatment perimenopausal symptoms, cyclical pain, and should improve AUB. However, symptoms can take up to 3-6 month to improve. Also discussed PFPT given chronic nature of pain and dyspareunia, wants to consider this further  NSAIDS and tylenol  PRN for LLQ pain RTO in 3 month Consider EMB, CBC if symptoms unimproved    Orders: -     Pregnancy, urine -     Norethindrone; Take 1 tablet (0.35 mg total) by mouth daily.  Dispense: 28 tablet; Refill: 11  LLQ pain -     Norethindrone; Take 1 tablet (0.35 mg total) by mouth daily.  Dispense: 28 tablet; Refill: 11  Fibroids -     Norethindrone; Take 1 tablet (0.35 mg total) by mouth daily.  Dispense: 28 tablet; Refill: 11    Vera LULLA Pa, MD

## 2024-10-02 ENCOUNTER — Encounter: Payer: Self-pay | Admitting: Obstetrics and Gynecology

## 2024-10-02 ENCOUNTER — Ambulatory Visit (INDEPENDENT_AMBULATORY_CARE_PROVIDER_SITE_OTHER): Admitting: Obstetrics and Gynecology

## 2024-10-02 VITALS — BP 104/78 | HR 83 | Temp 97.7°F | Ht 63.75 in | Wt 217.0 lb

## 2024-10-02 DIAGNOSIS — D219 Benign neoplasm of connective and other soft tissue, unspecified: Secondary | ICD-10-CM | POA: Insufficient documentation

## 2024-10-02 DIAGNOSIS — N939 Abnormal uterine and vaginal bleeding, unspecified: Secondary | ICD-10-CM | POA: Insufficient documentation

## 2024-10-02 DIAGNOSIS — R1032 Left lower quadrant pain: Secondary | ICD-10-CM | POA: Insufficient documentation

## 2024-10-02 LAB — PREGNANCY, URINE: Preg Test, Ur: NEGATIVE

## 2024-10-02 MED ORDER — NORETHINDRONE 0.35 MG PO TABS: 1.0000 | ORAL_TABLET | Freq: Every day | ORAL | 11 refills | Status: DC

## 2024-10-02 NOTE — Assessment & Plan Note (Addendum)
 AUB, multifactorial- 5 intramural fibroids measuring 2.6cm or less; perimenopause First line therapies reviewed, including hormonal theray. Also discussed endometrial ablation, uterine artery embolization and hysterectomy. However, given pain component, I would only recommend hysterectomy for surgical management. This would also help r/o endometriosis given clinical presentation and h/o ectopic pregnancy. Patient is interested in hormonal therapy, discussed that this would treatment perimenopausal symptoms, cyclical pain, and should improve AUB. However, symptoms can take up to 3-6 month to improve. Also discussed PFPT given chronic nature of pain and dyspareunia, wants to consider this further NSAIDS and tylenol  PRN for LLQ pain RTO in 3 month Consider EMB, CBC if symptoms unimproved

## 2024-10-08 NOTE — Telephone Encounter (Signed)
 Left message to call [facility] Triage at [phone], option 4.

## 2024-10-12 NOTE — Progress Notes (Signed)
 Chief Complaint: Patient was seen in consultation today for papillary thyroid  cancer  Referring Physician(s): Kerr,Jeffrey  History of Present Illness: Alexandria [name] is a 43 y.o. female with a medical history significant for HTN, diverticulitis, obesity and papillary thyroid  cancer s/p total thyroidectomy and central node dissection January 2008. 12 out of 26 lymph nodes were positive. She is familiar to IR from a biopsy of a nodule in the thyroid  bed as well as biopsies of enlarged lymph nodes 03/05/2019. Pathology of the nodule was positive for papillary carcinoma. The lymph node biopsies were negative for malignancy.   The patient would like to avoid additional surgery. She has been referred to Interventional Radiology to explore radiofrequency ablation as a possible treatment option.   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 Lindy   Mitral valve prolapse    Obesity (BMI 30-39.9)    Ovarian cyst    Papillary carcinoma of thyroid  (HCC)    Postsurgical hypothyroidism    Thyroid  cancer (HCC)    Thyroid    Trouble staying asleep     Past Surgical History:  Procedure Laterality Date   BUBBLE STUDY  07/31/2022   Procedure: BUBBLE STUDY;  Surgeon: Santo Stanly LABOR, MD;  Location: [facility] ENDOSCOPY;  Service: Cardiovascular;;   GALLBLADDER SURGERY     left leg surgery     parathyroid gland     TEE WITHOUT CARDIOVERSION N/A 07/31/2022   Procedure: TRANSESOPHAGEAL ECHOCARDIOGRAM (TEE);  Surgeon: Santo Stanly LABOR, MD;  Location: [facility] ENDOSCOPY;  Service: Cardiovascular;   Laterality: N/A;   thyroid  removed      Allergies: Penicillins  Medications: Prior to Admission medications   Medication Sig Start Date End Date Taking? Authorizing Provider  amLODipine (NORVASC) 5 MG tablet Take 5 mg by mouth daily. 07/11/22   [provider]  Calcium Carbonate Antacid (ANTACID CALCIUM EXTRA STRENGTH PO) Take 2 tablets by mouth at bedtime as needed (heartburn).    [provider]  carvedilol (COREG) 12.5 MG tablet Take 12.5 mg by mouth 2 (two) times daily. 07/06/22   [provider]  Cholecalciferol 25 MCG (1000 UT) CHEW Chew 2,000 Units by mouth daily.    [provider]  ELDERBERRY PO Take by mouth.    [provider]  fexofenadine (ALLEGRA) 180 MG tablet Take 180 mg by mouth daily as needed for allergies or rhinitis.    [provider]  fluticasone (FLONASE) 50 MCG/ACT nasal spray Place 1 spray into both nostrils daily as needed for allergies or rhinitis.    [provider]  liothyronine (CYTOMEL) 25 MCG tablet Take 12.5 mcg by mouth in the morning and at bedtime.    [provider]  Multiple Vitamins-Minerals (MULTIVITAMIN) LIQD Take 1 tablet by mouth daily.    [provider]  norethindrone (MICRONOR) 0.35 MG tablet Take 1 tablet (0.35 mg total) by mouth daily. 10/02/24   Hines, Vera GAILS, MD  olmesartan-hydrochlorothiazide  (BENICAR HCT) 40-25 MG tablet Take 1 tablet by mouth daily. 12/12/21   [provider]  omeprazole  (PRILOSEC) 40 MG capsule  Take 40 mg by mouth daily. 10/30/17   [provider]  SYNTHROID  75 MCG tablet Take 75 mcg by mouth daily. 07/11/22   [provider]  WEGOVY 0.25 MG/0.5ML SOAJ Inject 0.25 mg into the skin once a week. 10/02/23   [provider]     Family History  Problem Relation Age of Onset   Hypertension Mother    Diabetes Mother    Thalassemia Father    Liver disease Sister    Asthma Sister    Hypertension Paternal Aunt     Stroke Paternal Aunt    Hypertension Paternal Uncle    Anxiety disorder Paternal Uncle    Hypertension Maternal Grandmother    Cancer Maternal Grandfather    Diabetes Maternal Grandfather    Parkinsonism Maternal Grandfather    Alzheimer's disease Maternal Grandfather     Social History   Socioeconomic History   Marital status: Married    Spouse name: Not on file   Number of children: Not on file   Years of education: Not on file   Highest education level: Not on file  Occupational History   Not on file  Tobacco Use   Smoking status: Never   Smokeless tobacco: Never  Vaping Use   Vaping status: Never Used  Substance and Sexual Activity   Alcohol use: No    Comment: occ   Drug use: No   Sexual activity: Yes    Birth control/protection: None  Other Topics Concern   Not on file  Social History Narrative   Not on file   Social Drivers of Health   Financial Resource Strain: Not on file  Food Insecurity: Not on file  Transportation Needs: Not on file  Physical Activity: Not on file  Stress: Not on file  Social Connections: Not on file     Review of Systems: A 12 point ROS discussed and pertinent positives are indicated in the HPI above.  All other systems are negative.  Vital Signs: LMP 09/11/2024 (Exact Date)   Physical Exam  Imaging:    TFTs Serum TSH <0.01 Serum free T4 n/a Serum T3 n/a Thyroperoxidase Antibody n/a Thyroglobulin Antibody <1 Calcitonin n/a   Prior Thyroid  FNA: ***   Assessment and Plan:  43 year old female with a history of papillary thyroid  cancer s/p total thyroidectomy in 2008.   Thank you for this interesting consult.  I greatly enjoyed meeting Jamal L Moralez and look forward to participating in their care.  A copy of this report was sent to the requesting provider on this date.  Ester Sides, MD Pager: [phone]    I spent a total of  40 Minutes   in face to face in clinical consultation, greater than 50% of  which was counseling/coordinating care for papillary thyroid  cancer.

## 2024-10-13 ENCOUNTER — Ambulatory Visit
Admission: RE | Admit: 2024-10-13 | Discharge: 2024-10-13 | Disposition: A | Discharge status: Home / Self Care | Source: Ambulatory Visit | Attending: Internal Medicine | Admitting: Internal Medicine

## 2024-10-13 ENCOUNTER — Other Ambulatory Visit: Payer: Self-pay | Admitting: Interventional Radiology

## 2024-10-13 DIAGNOSIS — R490 Dysphonia: Secondary | ICD-10-CM

## 2024-10-13 DIAGNOSIS — C770 Secondary and unspecified malignant neoplasm of lymph nodes of head, face and neck: Secondary | ICD-10-CM

## 2024-10-13 DIAGNOSIS — C73 Malignant neoplasm of thyroid gland: Secondary | ICD-10-CM

## 2024-10-13 DIAGNOSIS — R1310 Dysphagia, unspecified: Secondary | ICD-10-CM

## 2024-10-13 HISTORY — PX: IR RADIOLOGIST EVAL & MGMT: IMG5224

## 2024-10-14 ENCOUNTER — Ambulatory Visit (HOSPITAL_COMMUNITY)
Admission: RE | Admit: 2024-10-14 | Discharge: 2024-10-14 | Disposition: A | Discharge status: Home / Self Care | Source: Ambulatory Visit | Attending: Cardiology | Admitting: Cardiology

## 2024-10-14 DIAGNOSIS — I341 Nonrheumatic mitral (valve) prolapse: Secondary | ICD-10-CM | POA: Insufficient documentation

## 2024-10-14 DIAGNOSIS — I340 Nonrheumatic mitral (valve) insufficiency: Secondary | ICD-10-CM | POA: Insufficient documentation

## 2024-10-14 LAB — ECHOCARDIOGRAM COMPLETE
Area-P 1/2: 2.94 cm2
P 1/2 time: 384 ms
S' Lateral: 2.7 cm

## 2024-10-15 ENCOUNTER — Ambulatory Visit: Payer: Self-pay | Admitting: Physician Assistant

## 2024-10-23 ENCOUNTER — Ambulatory Visit
Admission: RE | Admit: 2024-10-23 | Discharge: 2024-10-23 | Disposition: A | Discharge status: Home / Self Care | Source: Ambulatory Visit | Attending: Interventional Radiology | Admitting: Interventional Radiology

## 2024-10-23 DIAGNOSIS — R1310 Dysphagia, unspecified: Secondary | ICD-10-CM

## 2024-10-23 DIAGNOSIS — R490 Dysphonia: Secondary | ICD-10-CM

## 2024-10-24 NOTE — Progress Notes (Signed)
 This encounter was conducted via the [employer] providing interactive audio and visual communication.  The patient provided verbal consent to conduct a virtual appointment.  The patient was located at their primary residence during this encounter.   Referring Physician(s): Kerr,Jeffrey   Chief Complaint: The patient is seen in virtual video consultation today for papillary thyroid  cancer.   History of present illness: HPI from initial consultation 10/13/24 Alexandria [name] is a 43 y.o. female with a medical history significant for HTN, diverticulitis, obesity and papillary thyroid  cancer s/p total thyroidectomy and central node dissection January 2008. 12 out of 26 lymph nodes were positive. She is familiar to IR from a biopsy of a nodule in the thyroid  bed as well as biopsies of enlarged lymph nodes 03/05/2019. Pathology of the nodule was positive for papillary carcinoma. The lymph node biopsies were negative for malignancy.  In 2021 she was seen by an ENT who offered several rounds of ethanol ablation to the metastatic nodes.  She has recently experienced some fullness in her neck.  She has some hoarseness to her voice which is new.  She feels like she has to clear her throat frequently.    The patient would like to avoid additional surgery. She has been referred to Interventional Radiology to explore radiofrequency ablation as a possible treatment option.    Given the lack of recent imaging, we discussed obtaining a soft tissue neck ultrasound to evaluate the thyroid  surgical bed and regional lymph nodes for any suspicious enlargement, then following up to discuss next steps. Her ultrasound study was completed 10/23/24 and she presents to the clinic today via virtual video visit to discuss these results. No changes in symptoms since last visit.  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 Lindy   Mitral valve prolapse    Obesity (BMI 30-39.9)    Ovarian cyst    Papillary carcinoma of thyroid  (HCC)    Postsurgical hypothyroidism    Thyroid  cancer (HCC)    Thyroid    Trouble staying asleep     Past Surgical History:  Procedure Laterality Date   BUBBLE STUDY  07/31/2022   Procedure: BUBBLE STUDY;  Surgeon: Santo Stanly LABOR, MD;  Location: [facility] ENDOS[facility];  Service: Cardiovascular;;   GALLBLADDER SURGERY     IR RADIOLOGIST EVAL & MGMT  10/13/2024   left leg surgery     parathyroid gland     TEE WITHOUT CARDIOVERSION N/A 07/31/2022   Procedure: TRANSESOPHAGEAL ECHOCARDIOGRAM (TEE);  Surgeon: Santo Stanly LABOR, MD;  Location: [facility] ENDOSCOPY;  Service: Cardiovascular;  Laterality: N/A;   thyroid  removed      Allergies: Penicillins  Medications: Prior to Admission medications   Medication Sig Start Date End Date Taking? Authorizing Provider  amLODipine (NORVASC) 5 MG tablet Take 5 mg by mouth daily. 07/11/22   [provider]  Calcium Carbonate Antacid (ANTACID CALCIUM EXTRA STRENGTH PO) Take 2 tablets by mouth at bedtime as needed (heartburn).    [provider]  carvedilol (COREG) 12.5 MG tablet Take 12.5 mg by mouth 2 (two) times daily. 07/06/22   [provider]  Cholecalciferol 25  MCG (1000 UT) CHEW Chew 2,000 Units by mouth daily.    [provider]  ELDERBERRY PO Take by mouth.    [provider]  fexofenadine (ALLEGRA) 180 MG tablet Take 180 mg by mouth daily as needed for allergies or rhinitis.    [provider]  fluticasone (FLONASE) 50 MCG/ACT nasal spray Place 1 spray into both nostrils daily as needed for allergies or rhinitis.    [provider]  liothyronine (CYTOMEL) 25 MCG  tablet Take 12.5 mcg by mouth in the morning and at bedtime.    [provider]  Multiple Vitamins-Minerals (MULTIVITAMIN) LIQD Take 1 tablet by mouth daily.    [provider]  norethindrone (MICRONOR) 0.35 MG tablet Take 1 tablet (0.35 mg total) by mouth daily. 10/02/24   Hines, Vera GAILS, MD  olmesartan-hydrochlorothiazide  (BENICAR HCT) 40-25 MG tablet Take 1 tablet by mouth daily. 12/12/21   [provider]  omeprazole  (PRILOSEC) 40 MG capsule Take 40 mg by mouth daily. 10/30/17   [provider]  SYNTHROID  75 MCG tablet Take 75 mcg by mouth daily. 07/11/22   [provider]  WEGOVY 0.25 MG/0.5ML SOAJ Inject 0.25 mg into the skin once a week. 10/02/23   [provider]     Family History  Problem Relation Age of Onset   Hypertension Mother    Diabetes Mother    Thalassemia Father    Liver disease Sister    Asthma Sister    Hypertension Paternal Aunt    Stroke Paternal Aunt    Hypertension Paternal Uncle    Anxiety disorder Paternal Uncle    Hypertension Maternal Grandmother    Cancer Maternal Grandfather    Diabetes Maternal Grandfather    Parkinsonism Maternal Grandfather    Alzheimer's disease Maternal Grandfather     Social History   Socioeconomic History   Marital status: Married    Spouse name: Not on file   Number of children: Not on file   Years of education: Not on file   Highest education level: Not on file  Occupational History   Not on file  Tobacco Use   Smoking status: Never   Smokeless tobacco: Never  Vaping Use   Vaping status: Never Used  Substance and Sexual Activity   Alcohol use: No    Comment: occ   Drug use: No   Sexual activity: Yes    Birth control/protection: None  Other Topics Concern   Not on file  Social History Narrative   Not on file   Social Drivers of Health   Financial Resource Strain: Not on file  Food Insecurity: Not on file  Transportation Needs: Not on file  Physical  Activity: Not on file  Stress: Not on file  Social Connections: Not on file     Vital Signs: LMP 09/11/2024 (Exact Date)   Physical Exam  Patient is alert, oriented and able to participate fully in the conversation. No apparent discomfort or distress observed. She appears appropriately dressed.    Imaging: US  SOFT TISSUE HEAD & NECK (NON-THYROID ) Result Date: 10/23/2024 EXAM: US  THYROID  10/23/2024 11:50:30 AM TECHNIQUE: Real-time ultrasound scan of the thyroidectomy bed and soft tissues of the neck with image documentation. COMPARISON: PET CT 03/08/2023 and previous. CLINICAL HISTORY: soft tissue neck ultrasound to evaluate the thyroid  surgical bed and regional lymph nodes for any suspicious enlargement. FINDINGS: Right thyroid  lobe: Post thyroidectomy. No evidence of residual/recurrent thyroid  tissue. Left thyroid  lobe: Post thyroidectomy. No evidence of residual/recurrent thyroid   tissue. Isthmus: Post thyroidectomy. No evidence of residual/recurrent thyroid  tissue. Soft tissues: No cervical adenopathy identified. IMPRESSION: 1. Post thyroidectomy without residual or recurrent thyroid  tissue identified. 2. No cervical adenopathy identified. Electronically signed by: Katheleen Faes MD 10/23/2024 02:25 PM EDT RP Workstation: [id number]    Labs:  TFTs Serum TSH <0.01 Serum free T4 n/a Serum T3 n/a Thyroperoxidase Antibody n/a Thyroglobulin Antibody <1 Calcitonin n/a  Assessment and Plan:  43 year old female with a history of papillary thyroid  cancer status post total thyroidectomy in 2008 with cervical regional metastasis, treated previously with percutaneous ethanol injections by an ENT. She now has some vague symptoms of dysphagia and hoarseness however no significant change in recent thyroglobulin levels to indicate definite recurrence.   A thyroid  ultrasound was completed 10/23/24 and this was negative for lymphadenopathy or other signs of disease recurrence.   We discussed that  she has no visible targets for percutaneous intervention.  I recommended she continue follow up care with Dr. Faythe, and follow up with us  in IR as needed.   Ester Sides, MD Pager: [phone]    I spent a total of 25 Minutes in virtual video clinical consultation, greater than 50% of which was counseling/coordinating care for papillary thyroid  cancer.

## 2024-10-27 ENCOUNTER — Ambulatory Visit
Admission: RE | Admit: 2024-10-27 | Discharge: 2024-10-27 | Disposition: A | Discharge status: Home / Self Care | Source: Ambulatory Visit | Attending: Interventional Radiology | Admitting: Interventional Radiology

## 2024-10-27 DIAGNOSIS — R1310 Dysphagia, unspecified: Secondary | ICD-10-CM

## 2024-10-27 DIAGNOSIS — R490 Dysphonia: Secondary | ICD-10-CM

## 2024-10-27 HISTORY — PX: IR RADIOLOGIST EVAL & MGMT: IMG5224

## 2025-01-05 ENCOUNTER — Ambulatory Visit: Payer: Self-pay | Admitting: Obstetrics and Gynecology

## 2025-01-15 ENCOUNTER — Ambulatory Visit (INDEPENDENT_AMBULATORY_CARE_PROVIDER_SITE_OTHER): Payer: Self-pay | Admitting: Obstetrics and Gynecology

## 2025-01-15 ENCOUNTER — Encounter: Payer: Self-pay | Admitting: Obstetrics and Gynecology

## 2025-01-15 ENCOUNTER — Ambulatory Visit: Payer: Self-pay | Admitting: Obstetrics and Gynecology

## 2025-01-15 VITALS — BP 104/72 | HR 72 | Temp 98.1°F | Ht 64.0 in | Wt 222.0 lb

## 2025-01-15 DIAGNOSIS — D219 Benign neoplasm of connective and other soft tissue, unspecified: Secondary | ICD-10-CM

## 2025-01-15 DIAGNOSIS — G43829 Menstrual migraine, not intractable, without status migrainosus: Secondary | ICD-10-CM

## 2025-01-15 DIAGNOSIS — R1032 Left lower quadrant pain: Secondary | ICD-10-CM

## 2025-01-15 DIAGNOSIS — N939 Abnormal uterine and vaginal bleeding, unspecified: Secondary | ICD-10-CM | POA: Diagnosis not present

## 2025-01-15 DIAGNOSIS — Z79890 Hormone replacement therapy (postmenopausal): Secondary | ICD-10-CM

## 2025-01-15 DIAGNOSIS — N946 Dysmenorrhea, unspecified: Secondary | ICD-10-CM | POA: Diagnosis not present

## 2025-01-15 MED ORDER — ESTRADIOL-NORETHINDRONE ACET 1-0.5 MG PO TABS: 1.0000 | ORAL_TABLET | Freq: Every day | ORAL | 9 refills | Status: AC

## 2025-01-15 NOTE — Progress Notes (Signed)
 "  44 y.o. [id number] female with AUB, fibroids, LLQ abdominal pain, dyspareunia, menstrual migraines, HTN, MVP (follows with cardiology annually), h/o thyroid  cancer (2009) here for follow-up exam. Married, vasectomy.  Patient's last menstrual period was 12/24/2024 (exact date).   10/02/24: She reports LLQ pain and dyspareunia in the same location the week before her cycles. Pain has been going on for years. Initially, the pain started at the same time as a diverticulitis flare.  The pain improved once the flare resolved. It has returned over the last couple of months.  She notes intermittent constipation.  She is working on improving her diet with the weight management clinic.  Her periods have also become heavier with cycle day 2 and 3 requiring multiple tampons and pads.  She also notes premenstrual migraines (though these have improved over time) and mood changes.  She has tried multiple kinds of hormonal therapy over the years with poorly controlled symptoms.  She last tried an IUD and her periods were lighter but not resolved.  Today, she reports AUB has improved.  Her flow has decreased and is not as heavy. LLQ pain is also improved, mild dysmenorrhea. No dyspareunia.  She is having breast tenderness and swelling and migraines everyday of her cycle. She has a history of menstrual migriane. Takes aleve and that takes the edge off. She has seen neurology previously, she took triptans and an antiseizure medication for migraines. She does not want to start either of those medications again at this time. She is happy with improvements in bleeding and pelvic pain and would like to continue hormonal therapy instead of procedures or surgery at this time.  Per referral: 07/22/2024 Pap NIL, HPV negative; FSH, estradiol , progesterone wnl, total testosterone 59 (slightly elevated) 08/14/2024 TVUS: 5 intramural fibroids noted, the largest measuring 2.6 cm.  Complex cyst of bilateral ovaries,  measuring 1 cm (right) and 1.7 cm (left). 2024 TVUS: Normal  Last Hgb on records 2023 wnl  Birth control: vasectomy Last mammogram: 01/13/21 Sexually active: Yes   GYN HISTORY: Ectopic pregnancy treated with MTX  OB History  Gravida Para Term Preterm AB Living  3 2 2  1 2   SAB IAB Ectopic Multiple Live Births  1    2    # Outcome Date GA Lbr Len/2nd Weight Sex Type Anes PTL Lv  3 Term 05/2011    M Vag-Spont   LIV  2 Term 10/1999 [redacted]w[redacted]d   M Vag-Spont EPI N LIV  1 SAB            Past Medical History:  Diagnosis Date   Allergy    BMI 34.0-34.9,adult    COVID-19 virus infection 09/2020   Depressed left ventricular ejection fraction    Diverticulitis    Hypertension    Hypertension    Intractable migraine without aura and without status migrainosus    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 Lindy   Mitral valve prolapse    Obesity (BMI 30-39.9)    Ovarian cyst    Papillary carcinoma of thyroid  (HCC)    Postsurgical hypothyroidism    Thyroid  cancer (HCC)    Thyroid    Trouble staying asleep    Past Surgical History:  Procedure Laterality Date   BUBBLE STUDY  07/31/2022   Procedure: BUBBLE STUDY;  Surgeon: Santo Stanly LABOR, MD;  Location: [facility] ENDOSCOPY;  Service: Cardiovascular;;   GALLBLADDER SURGERY     IR RADIOLOGIST EVAL & MGMT  10/13/2024   IR RADIOLOGIST EVAL & MGMT  10/27/2024   left leg surgery     parathyroid gland     TEE WITHOUT CARDIOVERSION N/A 07/31/2022   Procedure: TRANSESOPHAGEAL ECHOCARDIOGRAM (TEE);  Surgeon: Santo Stanly LABOR, MD;  Location: [facility] ENDOSCOPY;  Service: Cardiovascular;  Laterality: N/A;   thyroid  removed     Current Outpatient Medications on File Prior to Visit  Medication Sig Dispense Refill   amLODipine (NORVASC) 5 MG tablet Take 5 mg by mouth daily.     Calcium Carbonate Antacid (ANTACID CALCIUM EXTRA STRENGTH PO) Take 2 tablets by mouth at bedtime as needed (heartburn).     carvedilol  (COREG) 12.5 MG tablet Take 12.5 mg by mouth 2 (two) times daily.     Cholecalciferol 25 MCG (1000 UT) CHEW Chew 2,000 Units by mouth daily.     ELDERBERRY PO Take by mouth.     fexofenadine (ALLEGRA) 180 MG tablet Take 180 mg by mouth daily as needed for allergies or rhinitis.     fluticasone (FLONASE) 50 MCG/ACT nasal spray Place 1 spray into both nostrils daily as needed for allergies or rhinitis.     liothyronine (CYTOMEL) 25 MCG tablet Take 12.5 mcg by mouth in the morning and at bedtime.     Multiple Vitamins-Minerals (MULTIVITAMIN) LIQD Take 1 tablet by mouth daily.     olmesartan-hydrochlorothiazide  (BENICAR HCT) 40-25 MG tablet Take 1 tablet by mouth daily.     omeprazole  (PRILOSEC) 40 MG capsule Take 40 mg by mouth daily.     SYNTHROID  75 MCG tablet Take 75 mcg by mouth daily.     WEGOVY 0.25 MG/0.5ML SOAJ Inject 0.25 mg into the skin once a week. (Patient not taking: Reported on 01/15/2025)     No current facility-administered medications on file prior to visit.   Allergies  Allergen Reactions   Penicillins Other (See Comments)    Has not had since child, thinks she had reaction to it then    PE Today's Vitals   01/15/25 0931  BP: 104/72  Pulse: 72  Temp: 98.1 F (36.7 C)  TempSrc: Oral  SpO2: 98%  Weight: 222 lb (100.7 kg)  Height: 5' 4 (1.626 m)    Body mass index is 38.11 kg/m.  Physical Exam Vitals reviewed.  Constitutional:      General: She is not in acute distress.    Appearance: Normal appearance.  HENT:     Head: Normocephalic and atraumatic.     Nose: Nose normal.  Eyes:     Extraocular Movements: Extraocular movements intact.     Conjunctiva/sclera: Conjunctivae normal.  Pulmonary:     Effort: Pulmonary effort is normal.  Musculoskeletal:        General: Normal range of motion.     Cervical back: Normal range of motion.  Neurological:     General: No focal deficit present.     Mental Status: She is alert.  Psychiatric:        Mood and  Affect: Mood normal.        Behavior: Behavior normal.      Assessment and Plan:        Abnormal uterine bleeding (AUB) Fibroids LLQ pain Menstrual cramps Menstrual migraines Assessment & Plan: AUB, multifactorial- 5 intramural fibroids measuring 2.6cm or less; perimenopause October 2025 she was started on POP for treatment of perimenopausal symptoms, cyclical pain, and AUB with improvement in all of these symptoms however her menstrual migraines have returned. Since her husband has a  vasectomy, we discussed switching to HRT for management of hormonal migraines.  She would like to try this method, and she desires combined pill over patch+pill.  Reviewed symptoms can take up to 3-6 month to improve. Risks including heart attack, stroke, DVT were reviewed with patient.  Side effects were also reviewed. Will check A1c, lipid panel, CBC today. EMB not indicated given improvement in bleeding with POP NSAIDS and tylenol  PRN for LLQ pain RTO for 3 month med f/u  Declined PFPT in past, continue to consider if needed  Orders: -     CBC -     Estradiol -Norethindrone  Acet; Take 1 tablet by mouth daily.  Dispense: 30 tablet; Refill: 9  Hormone replacement therapy (HRT) -     Hemoglobin A1C w/out eAG -     Lipid panel -     Estradiol -Norethindrone  Acet; Take 1 tablet by mouth daily.  Dispense: 30 tablet; Refill: 9  Vera LULLA Pa, MD   "

## 2025-01-15 NOTE — Assessment & Plan Note (Addendum)
 AUB, multifactorial- 5 intramural fibroids measuring 2.6cm or less; perimenopause October 2025 she was started on POP for treatment of perimenopausal symptoms, cyclical pain, and AUB with improvement in all of these symptoms however her menstrual migraines have returned. Since her husband has a vasectomy, we discussed switching to HRT for management of hormonal migraines.  She would like to try this method, and she desires combined pill over patch+pill.  Reviewed symptoms can take up to 3-6 month to improve. Risks including heart attack, stroke, DVT were reviewed with patient.  Side effects were also reviewed. Will check A1c, lipid panel, CBC today. EMB not indicated given improvement in bleeding with POP NSAIDS and tylenol  PRN for LLQ pain RTO for 3 month med f/u  Declined PFPT in past, continue to consider if needed

## 2025-01-16 LAB — CBC
HCT: 40.7 % (ref 35.9–46.0)
Hemoglobin: 12.8 g/dL (ref 11.7–15.5)
MCH: 26.2 pg — ABNORMAL LOW (ref 27.0–33.0)
MCHC: 31.4 g/dL — ABNORMAL LOW (ref 31.6–35.4)
MCV: 83.2 fL (ref 81.4–101.7)
MPV: 11.2 fL (ref 7.5–12.5)
Platelets: 389 Thousand/uL (ref 140–400)
RBC: 4.89 Million/uL (ref 3.80–5.10)
RDW: 13.2 % (ref 11.0–15.0)
WBC: 5.4 Thousand/uL (ref 3.8–10.8)

## 2025-01-16 LAB — LIPID PANEL
Cholesterol: 149 mg/dL
HDL: 59 mg/dL
LDL Cholesterol (Calc): 76 mg/dL
Non-HDL Cholesterol (Calc): 90 mg/dL
Total CHOL/HDL Ratio: 2.5 (calc)
Triglycerides: 63 mg/dL

## 2025-01-16 LAB — HEMOGLOBIN A1C: Hgb A1c MFr Bld: 5.9 % — ABNORMAL HIGH

## 2025-04-15 ENCOUNTER — Ambulatory Visit: Admitting: Obstetrics and Gynecology
